# Patient Record
Sex: Male | Born: 1935
Health system: Southern US, Community
[De-identification: ages and names within clinical notes are randomized; demographics above are authoritative.]

## PROBLEM LIST (undated history)

## (undated) DIAGNOSIS — N182 Chronic kidney disease, stage 2 (mild): Secondary | ICD-10-CM

## (undated) DIAGNOSIS — Z720 Tobacco use: Secondary | ICD-10-CM

## (undated) DIAGNOSIS — E785 Hyperlipidemia, unspecified: Secondary | ICD-10-CM

## (undated) DIAGNOSIS — I10 Essential (primary) hypertension: Secondary | ICD-10-CM

## (undated) DIAGNOSIS — R7303 Prediabetes: Secondary | ICD-10-CM

## (undated) DIAGNOSIS — S065XAA Traumatic subdural hemorrhage with loss of consciousness status unknown, initial encounter: Secondary | ICD-10-CM

## (undated) DIAGNOSIS — N200 Calculus of kidney: Secondary | ICD-10-CM

## (undated) DIAGNOSIS — S065X9A Traumatic subdural hemorrhage with loss of consciousness of unspecified duration, initial encounter: Secondary | ICD-10-CM

## (undated) DIAGNOSIS — I255 Ischemic cardiomyopathy: Secondary | ICD-10-CM

## (undated) DIAGNOSIS — D696 Thrombocytopenia, unspecified: Secondary | ICD-10-CM

## (undated) DIAGNOSIS — I2510 Atherosclerotic heart disease of native coronary artery without angina pectoris: Secondary | ICD-10-CM

## (undated) DIAGNOSIS — Z789 Other specified health status: Secondary | ICD-10-CM

## (undated) DIAGNOSIS — I4510 Unspecified right bundle-branch block: Secondary | ICD-10-CM

## (undated) HISTORY — DX: Atherosclerotic heart disease of native coronary artery without angina pectoris: I25.10

## (undated) HISTORY — DX: Hyperlipidemia, unspecified: E78.5

## (undated) HISTORY — DX: Prediabetes: R73.03

## (undated) HISTORY — DX: Thrombocytopenia, unspecified: D69.6

## (undated) HISTORY — DX: Tobacco use: Z72.0

## (undated) HISTORY — DX: Chronic kidney disease, stage 2 (mild): N18.2

## (undated) HISTORY — DX: Ischemic cardiomyopathy: I25.5

## (undated) HISTORY — DX: Calculus of kidney: N20.0

## (undated) HISTORY — DX: Unspecified right bundle-branch block: I45.10

## (undated) HISTORY — DX: Traumatic subdural hemorrhage with loss of consciousness status unknown, initial encounter: S06.5XAA

## (undated) HISTORY — DX: Traumatic subdural hemorrhage with loss of consciousness of unspecified duration, initial encounter: S06.5X9A

## (undated) HISTORY — DX: Other specified health status: Z78.9

---

## 2014-06-13 DIAGNOSIS — E782 Mixed hyperlipidemia: Secondary | ICD-10-CM | POA: Diagnosis not present

## 2014-06-13 DIAGNOSIS — E119 Type 2 diabetes mellitus without complications: Secondary | ICD-10-CM | POA: Diagnosis not present

## 2014-06-15 DIAGNOSIS — E782 Mixed hyperlipidemia: Secondary | ICD-10-CM | POA: Diagnosis not present

## 2014-06-15 DIAGNOSIS — E119 Type 2 diabetes mellitus without complications: Secondary | ICD-10-CM | POA: Diagnosis not present

## 2014-06-15 DIAGNOSIS — I10 Essential (primary) hypertension: Secondary | ICD-10-CM | POA: Diagnosis not present

## 2014-12-18 DIAGNOSIS — E782 Mixed hyperlipidemia: Secondary | ICD-10-CM | POA: Diagnosis not present

## 2014-12-18 DIAGNOSIS — Z125 Encounter for screening for malignant neoplasm of prostate: Secondary | ICD-10-CM | POA: Diagnosis not present

## 2014-12-18 DIAGNOSIS — E119 Type 2 diabetes mellitus without complications: Secondary | ICD-10-CM | POA: Diagnosis not present

## 2014-12-21 DIAGNOSIS — E119 Type 2 diabetes mellitus without complications: Secondary | ICD-10-CM | POA: Diagnosis not present

## 2014-12-21 DIAGNOSIS — Z9181 History of falling: Secondary | ICD-10-CM | POA: Diagnosis not present

## 2014-12-21 DIAGNOSIS — Z139 Encounter for screening, unspecified: Secondary | ICD-10-CM | POA: Diagnosis not present

## 2014-12-21 DIAGNOSIS — I10 Essential (primary) hypertension: Secondary | ICD-10-CM | POA: Diagnosis not present

## 2014-12-21 DIAGNOSIS — E782 Mixed hyperlipidemia: Secondary | ICD-10-CM | POA: Diagnosis not present

## 2014-12-21 DIAGNOSIS — Z5329 Procedure and treatment not carried out because of patient's decision for other reasons: Secondary | ICD-10-CM | POA: Diagnosis not present

## 2015-06-23 DIAGNOSIS — S62631A Displaced fracture of distal phalanx of left index finger, initial encounter for closed fracture: Secondary | ICD-10-CM | POA: Diagnosis not present

## 2015-06-23 DIAGNOSIS — I10 Essential (primary) hypertension: Secondary | ICD-10-CM | POA: Diagnosis not present

## 2015-06-23 DIAGNOSIS — S62632B Displaced fracture of distal phalanx of right middle finger, initial encounter for open fracture: Secondary | ICD-10-CM | POA: Diagnosis not present

## 2015-06-23 DIAGNOSIS — S62601B Fracture of unspecified phalanx of left index finger, initial encounter for open fracture: Secondary | ICD-10-CM | POA: Diagnosis not present

## 2015-06-23 DIAGNOSIS — S61311A Laceration without foreign body of left index finger with damage to nail, initial encounter: Secondary | ICD-10-CM | POA: Diagnosis not present

## 2015-06-23 DIAGNOSIS — S61312A Laceration without foreign body of right middle finger with damage to nail, initial encounter: Secondary | ICD-10-CM | POA: Diagnosis not present

## 2015-06-23 DIAGNOSIS — S62631B Displaced fracture of distal phalanx of left index finger, initial encounter for open fracture: Secondary | ICD-10-CM | POA: Diagnosis not present

## 2015-06-23 DIAGNOSIS — S61212A Laceration without foreign body of right middle finger without damage to nail, initial encounter: Secondary | ICD-10-CM | POA: Diagnosis not present

## 2015-06-23 DIAGNOSIS — S6991XA Unspecified injury of right wrist, hand and finger(s), initial encounter: Secondary | ICD-10-CM | POA: Diagnosis not present

## 2015-06-23 DIAGNOSIS — S62604A Fracture of unspecified phalanx of right ring finger, initial encounter for closed fracture: Secondary | ICD-10-CM | POA: Diagnosis not present

## 2015-06-23 DIAGNOSIS — Z23 Encounter for immunization: Secondary | ICD-10-CM | POA: Diagnosis not present

## 2015-06-23 DIAGNOSIS — Z720 Tobacco use: Secondary | ICD-10-CM | POA: Diagnosis not present

## 2015-06-23 DIAGNOSIS — S62633A Displaced fracture of distal phalanx of left middle finger, initial encounter for closed fracture: Secondary | ICD-10-CM | POA: Diagnosis not present

## 2015-06-23 DIAGNOSIS — W312XXA Contact with powered woodworking and forming machines, initial encounter: Secondary | ICD-10-CM | POA: Diagnosis not present

## 2015-06-23 DIAGNOSIS — S62632A Displaced fracture of distal phalanx of right middle finger, initial encounter for closed fracture: Secondary | ICD-10-CM | POA: Diagnosis not present

## 2015-06-23 DIAGNOSIS — S61214A Laceration without foreign body of right ring finger without damage to nail, initial encounter: Secondary | ICD-10-CM | POA: Diagnosis not present

## 2015-06-25 DIAGNOSIS — S68621D Partial traumatic transphalangeal amputation of left index finger, subsequent encounter: Secondary | ICD-10-CM | POA: Diagnosis not present

## 2015-06-25 DIAGNOSIS — S68624D Partial traumatic transphalangeal amputation of right ring finger, subsequent encounter: Secondary | ICD-10-CM | POA: Diagnosis not present

## 2015-06-25 DIAGNOSIS — S68622D Partial traumatic transphalangeal amputation of right middle finger, subsequent encounter: Secondary | ICD-10-CM | POA: Diagnosis not present

## 2015-07-02 DIAGNOSIS — S68621D Partial traumatic transphalangeal amputation of left index finger, subsequent encounter: Secondary | ICD-10-CM | POA: Diagnosis not present

## 2015-07-02 DIAGNOSIS — S68622D Partial traumatic transphalangeal amputation of right middle finger, subsequent encounter: Secondary | ICD-10-CM | POA: Diagnosis not present

## 2015-07-02 DIAGNOSIS — S68624D Partial traumatic transphalangeal amputation of right ring finger, subsequent encounter: Secondary | ICD-10-CM | POA: Diagnosis not present

## 2015-07-23 DIAGNOSIS — S68621D Partial traumatic transphalangeal amputation of left index finger, subsequent encounter: Secondary | ICD-10-CM | POA: Diagnosis not present

## 2015-07-23 DIAGNOSIS — S68624D Partial traumatic transphalangeal amputation of right ring finger, subsequent encounter: Secondary | ICD-10-CM | POA: Diagnosis not present

## 2015-07-23 DIAGNOSIS — S68622D Partial traumatic transphalangeal amputation of right middle finger, subsequent encounter: Secondary | ICD-10-CM | POA: Diagnosis not present

## 2015-07-30 DIAGNOSIS — E782 Mixed hyperlipidemia: Secondary | ICD-10-CM | POA: Diagnosis not present

## 2015-07-30 DIAGNOSIS — E119 Type 2 diabetes mellitus without complications: Secondary | ICD-10-CM | POA: Diagnosis not present

## 2015-08-02 DIAGNOSIS — Z5329 Procedure and treatment not carried out because of patient's decision for other reasons: Secondary | ICD-10-CM | POA: Diagnosis not present

## 2015-08-02 DIAGNOSIS — I10 Essential (primary) hypertension: Secondary | ICD-10-CM | POA: Diagnosis not present

## 2015-08-02 DIAGNOSIS — E782 Mixed hyperlipidemia: Secondary | ICD-10-CM | POA: Diagnosis not present

## 2015-08-02 DIAGNOSIS — E119 Type 2 diabetes mellitus without complications: Secondary | ICD-10-CM | POA: Diagnosis not present

## 2015-08-02 DIAGNOSIS — Z1389 Encounter for screening for other disorder: Secondary | ICD-10-CM | POA: Diagnosis not present

## 2017-06-28 ENCOUNTER — Ambulatory Visit: Payer: Self-pay | Admitting: Urology

## 2017-08-31 DIAGNOSIS — Z139 Encounter for screening, unspecified: Secondary | ICD-10-CM | POA: Diagnosis not present

## 2017-08-31 DIAGNOSIS — E782 Mixed hyperlipidemia: Secondary | ICD-10-CM | POA: Diagnosis not present

## 2017-08-31 DIAGNOSIS — I10 Essential (primary) hypertension: Secondary | ICD-10-CM | POA: Diagnosis not present

## 2017-08-31 DIAGNOSIS — Z1331 Encounter for screening for depression: Secondary | ICD-10-CM | POA: Diagnosis not present

## 2017-08-31 DIAGNOSIS — Z87442 Personal history of urinary calculi: Secondary | ICD-10-CM | POA: Diagnosis not present

## 2017-08-31 DIAGNOSIS — E119 Type 2 diabetes mellitus without complications: Secondary | ICD-10-CM | POA: Diagnosis not present

## 2017-09-16 DIAGNOSIS — L02511 Cutaneous abscess of right hand: Secondary | ICD-10-CM | POA: Diagnosis not present

## 2017-09-17 DIAGNOSIS — L02511 Cutaneous abscess of right hand: Secondary | ICD-10-CM | POA: Diagnosis not present

## 2017-09-30 DIAGNOSIS — L02511 Cutaneous abscess of right hand: Secondary | ICD-10-CM | POA: Diagnosis not present

## 2017-10-27 DIAGNOSIS — S68624A Partial traumatic transphalangeal amputation of right ring finger, initial encounter: Secondary | ICD-10-CM | POA: Diagnosis not present

## 2017-10-27 DIAGNOSIS — Z1839 Other retained organic fragments: Secondary | ICD-10-CM | POA: Diagnosis not present

## 2017-10-27 DIAGNOSIS — M71341 Other bursal cyst, right hand: Secondary | ICD-10-CM | POA: Diagnosis not present

## 2017-10-27 DIAGNOSIS — Z89021 Acquired absence of right finger(s): Secondary | ICD-10-CM | POA: Diagnosis not present

## 2017-10-27 DIAGNOSIS — M79644 Pain in right finger(s): Secondary | ICD-10-CM | POA: Diagnosis not present

## 2017-10-27 DIAGNOSIS — L602 Onychogryphosis: Secondary | ICD-10-CM | POA: Diagnosis not present

## 2017-10-27 DIAGNOSIS — M7989 Other specified soft tissue disorders: Secondary | ICD-10-CM | POA: Diagnosis not present

## 2017-10-29 DIAGNOSIS — M25841 Other specified joint disorders, right hand: Secondary | ICD-10-CM | POA: Diagnosis not present

## 2017-10-29 DIAGNOSIS — Z89022 Acquired absence of left finger(s): Secondary | ICD-10-CM | POA: Diagnosis not present

## 2017-10-29 DIAGNOSIS — L98498 Non-pressure chronic ulcer of skin of other sites with other specified severity: Secondary | ICD-10-CM | POA: Diagnosis not present

## 2017-10-29 DIAGNOSIS — S68119S Complete traumatic metacarpophalangeal amputation of unspecified finger, sequela: Secondary | ICD-10-CM | POA: Diagnosis not present

## 2017-10-29 DIAGNOSIS — L720 Epidermal cyst: Secondary | ICD-10-CM | POA: Diagnosis not present

## 2018-03-07 DIAGNOSIS — E782 Mixed hyperlipidemia: Secondary | ICD-10-CM | POA: Diagnosis not present

## 2018-03-07 DIAGNOSIS — Z5329 Procedure and treatment not carried out because of patient's decision for other reasons: Secondary | ICD-10-CM | POA: Diagnosis not present

## 2018-03-07 DIAGNOSIS — E119 Type 2 diabetes mellitus without complications: Secondary | ICD-10-CM | POA: Diagnosis not present

## 2018-03-07 DIAGNOSIS — I10 Essential (primary) hypertension: Secondary | ICD-10-CM | POA: Diagnosis not present

## 2018-05-30 ENCOUNTER — Other Ambulatory Visit: Payer: Self-pay

## 2018-05-30 ENCOUNTER — Encounter (HOSPITAL_COMMUNITY): Payer: Self-pay | Admitting: Cardiology

## 2018-05-30 ENCOUNTER — Encounter (HOSPITAL_COMMUNITY)
Admission: EM | Disposition: A | Discharge status: Home / Self Care | Payer: Self-pay | Source: Ambulatory Visit | Attending: Interventional Cardiology

## 2018-05-30 ENCOUNTER — Inpatient Hospital Stay (HOSPITAL_COMMUNITY)
Admission: EM | Admit: 2018-05-30 | Discharge: 2018-06-01 | DRG: 247 | Disposition: A | Discharge status: Home / Self Care | Payer: Medicare Other | Source: Ambulatory Visit | Attending: Interventional Cardiology | Admitting: Interventional Cardiology

## 2018-05-30 DIAGNOSIS — I2129 ST elevation (STEMI) myocardial infarction involving other sites: Principal | ICD-10-CM | POA: Diagnosis present

## 2018-05-30 DIAGNOSIS — Z716 Tobacco abuse counseling: Secondary | ICD-10-CM | POA: Diagnosis not present

## 2018-05-30 DIAGNOSIS — I2119 ST elevation (STEMI) myocardial infarction involving other coronary artery of inferior wall: Secondary | ICD-10-CM

## 2018-05-30 DIAGNOSIS — I10 Essential (primary) hypertension: Secondary | ICD-10-CM | POA: Diagnosis not present

## 2018-05-30 DIAGNOSIS — I2102 ST elevation (STEMI) myocardial infarction involving left anterior descending coronary artery: Secondary | ICD-10-CM

## 2018-05-30 DIAGNOSIS — I2510 Atherosclerotic heart disease of native coronary artery without angina pectoris: Secondary | ICD-10-CM | POA: Diagnosis not present

## 2018-05-30 DIAGNOSIS — Z720 Tobacco use: Secondary | ICD-10-CM | POA: Diagnosis not present

## 2018-05-30 DIAGNOSIS — R7303 Prediabetes: Secondary | ICD-10-CM | POA: Diagnosis present

## 2018-05-30 DIAGNOSIS — Z955 Presence of coronary angioplasty implant and graft: Secondary | ICD-10-CM

## 2018-05-30 DIAGNOSIS — E785 Hyperlipidemia, unspecified: Secondary | ICD-10-CM

## 2018-05-30 DIAGNOSIS — I499 Cardiac arrhythmia, unspecified: Secondary | ICD-10-CM | POA: Diagnosis not present

## 2018-05-30 DIAGNOSIS — R079 Chest pain, unspecified: Secondary | ICD-10-CM | POA: Diagnosis not present

## 2018-05-30 DIAGNOSIS — R231 Pallor: Secondary | ICD-10-CM | POA: Diagnosis not present

## 2018-05-30 DIAGNOSIS — Z87442 Personal history of urinary calculi: Secondary | ICD-10-CM | POA: Diagnosis not present

## 2018-05-30 DIAGNOSIS — Z8249 Family history of ischemic heart disease and other diseases of the circulatory system: Secondary | ICD-10-CM

## 2018-05-30 DIAGNOSIS — I213 ST elevation (STEMI) myocardial infarction of unspecified site: Secondary | ICD-10-CM | POA: Diagnosis not present

## 2018-05-30 HISTORY — PX: LEFT HEART CATH AND CORONARY ANGIOGRAPHY: CATH118249

## 2018-05-30 HISTORY — DX: Essential (primary) hypertension: I10

## 2018-05-30 HISTORY — PX: CORONARY/GRAFT ACUTE MI REVASCULARIZATION: CATH118305

## 2018-05-30 LAB — TSH: TSH: 1.558 u[IU]/mL (ref 0.350–4.500)

## 2018-05-30 LAB — CBC
HEMATOCRIT: 35.8 % — AB (ref 39.0–52.0)
Hemoglobin: 12.7 g/dL — ABNORMAL LOW (ref 13.0–17.0)
MCH: 31.1 pg (ref 26.0–34.0)
MCHC: 35.5 g/dL (ref 30.0–36.0)
MCV: 87.5 fL (ref 80.0–100.0)
Platelets: 120 10*3/uL — ABNORMAL LOW (ref 150–400)
RBC: 4.09 MIL/uL — ABNORMAL LOW (ref 4.22–5.81)
RDW: 11.9 % (ref 11.5–15.5)
WBC: 5.9 10*3/uL (ref 4.0–10.5)
nRBC: 0 % (ref 0.0–0.2)

## 2018-05-30 LAB — COMPREHENSIVE METABOLIC PANEL
ALT: 15 U/L (ref 0–44)
AST: 27 U/L (ref 15–41)
Albumin: 3.5 g/dL (ref 3.5–5.0)
Alkaline Phosphatase: 49 U/L (ref 38–126)
Anion gap: 12 (ref 5–15)
BUN: 19 mg/dL (ref 8–23)
CO2: 19 mmol/L — ABNORMAL LOW (ref 22–32)
Calcium: 8.6 mg/dL — ABNORMAL LOW (ref 8.9–10.3)
Chloride: 102 mmol/L (ref 98–111)
Creatinine, Ser: 1.16 mg/dL (ref 0.61–1.24)
GFR calc Af Amer: >60 mL/min (ref >60)
GFR calc non Af Amer: 58 mL/min — ABNORMAL LOW (ref >60)
Glucose, Bld: 197 mg/dL — ABNORMAL HIGH (ref 70–99)
Potassium: 3.6 mmol/L (ref 3.5–5.1)
Sodium: 133 mmol/L — ABNORMAL LOW (ref 135–145)
Total Bilirubin: 0.3 mg/dL (ref 0.3–1.2)
Total Protein: 5.9 g/dL — ABNORMAL LOW (ref 6.5–8.1)

## 2018-05-30 LAB — PROTIME-INR
INR: 1.4
Prothrombin Time: 16.6 seconds — ABNORMAL HIGH (ref 11.4–15.2)

## 2018-05-30 LAB — HEMOGLOBIN A1C
HEMOGLOBIN A1C: 6.1 % — AB (ref 4.8–5.6)
Mean Plasma Glucose: 128.37 mg/dL

## 2018-05-30 LAB — POCT ACTIVATED CLOTTING TIME
Activated Clotting Time: 285 seconds
Activated Clotting Time: 301 seconds

## 2018-05-30 LAB — POCT I-STAT 7, (LYTES, BLD GAS, ICA,H+H)
Acid-base deficit: 2 mmol/L (ref 0.0–2.0)
BICARBONATE: 23 mmol/L (ref 20.0–28.0)
Calcium, Ion: 1.24 mmol/L (ref 1.15–1.40)
HCT: 39 % (ref 39.0–52.0)
HEMOGLOBIN: 13.3 g/dL (ref 13.0–17.0)
O2 Saturation: 97 %
Potassium: 3.7 mmol/L (ref 3.5–5.1)
Sodium: 138 mmol/L (ref 135–145)
TCO2: 24 mmol/L (ref 22–32)
pCO2 arterial: 37.6 mmHg (ref 32.0–48.0)
pH, Arterial: 7.394 (ref 7.350–7.450)
pO2, Arterial: 92 mmHg (ref 83.0–108.0)

## 2018-05-30 LAB — TROPONIN I
Troponin I: 0.06 ng/mL (ref <0.03)
Troponin I: 8.76 ng/mL (ref <0.03)

## 2018-05-30 LAB — POCT I-STAT CREATININE: Creatinine, Ser: 1 mg/dL (ref 0.61–1.24)

## 2018-05-30 LAB — BRAIN NATRIURETIC PEPTIDE: B NATRIURETIC PEPTIDE 5: 40.6 pg/mL (ref 0.0–100.0)

## 2018-05-30 LAB — LIPID PANEL
Cholesterol: 152 mg/dL (ref 0–200)
HDL: 31 mg/dL — ABNORMAL LOW (ref >40)
LDL Cholesterol: 91 mg/dL (ref 0–99)
Total CHOL/HDL Ratio: 4.9 RATIO
Triglycerides: 151 mg/dL — ABNORMAL HIGH (ref <150)
VLDL: 30 mg/dL (ref 0–40)

## 2018-05-30 LAB — MRSA PCR SCREENING: MRSA by PCR: NEGATIVE

## 2018-05-30 LAB — APTT: aPTT: >200 seconds (ref 24–36)

## 2018-05-30 SURGERY — CORONARY/GRAFT ACUTE MI REVASCULARIZATION
Anesthesia: LOCAL

## 2018-05-30 MED ORDER — VERAPAMIL HCL 2.5 MG/ML IV SOLN
INTRAVENOUS | Status: AC
  Filled 2018-05-30: qty 2

## 2018-05-30 MED ORDER — OXYCODONE HCL 5 MG PO TABS: 5.0000 mg | ORAL_TABLET | ORAL | Status: DC | PRN

## 2018-05-30 MED ORDER — SODIUM CHLORIDE 0.9% FLUSH
3.0000 mL | Freq: Two times a day (BID) | INTRAVENOUS | Status: DC
  Administered 2018-05-30 – 2018-06-01 (×4): 3 mL via INTRAVENOUS

## 2018-05-30 MED ORDER — SODIUM CHLORIDE 0.9% FLUSH: 3.0000 mL | INTRAVENOUS | Status: DC | PRN

## 2018-05-30 MED ORDER — TICAGRELOR 90 MG PO TABS
ORAL_TABLET | ORAL | Status: AC
  Filled 2018-05-30: qty 2

## 2018-05-30 MED ORDER — TICAGRELOR 90 MG PO TABS
90.0000 mg | ORAL_TABLET | Freq: Two times a day (BID) | ORAL | Status: DC
  Administered 2018-05-30 – 2018-06-01 (×4): 90 mg via ORAL
  Filled 2018-05-30 (×4): qty 1

## 2018-05-30 MED ORDER — LISINOPRIL 20 MG PO TABS
40.0000 mg | ORAL_TABLET | Freq: Every day | ORAL | Status: DC
  Administered 2018-05-30 – 2018-06-01 (×3): 40 mg via ORAL
  Filled 2018-05-30 (×3): qty 2
  Filled 2018-05-30: qty 4

## 2018-05-30 MED ORDER — FENTANYL CITRATE (PF) 100 MCG/2ML IJ SOLN
INTRAMUSCULAR | Status: AC
  Filled 2018-05-30: qty 2

## 2018-05-30 MED ORDER — ATORVASTATIN CALCIUM 80 MG PO TABS
80.0000 mg | ORAL_TABLET | Freq: Every day | ORAL | Status: DC
  Administered 2018-05-30 – 2018-05-31 (×2): 80 mg via ORAL
  Filled 2018-05-30 (×2): qty 1

## 2018-05-30 MED ORDER — NITROGLYCERIN 1 MG/10 ML FOR IR/CATH LAB
INTRA_ARTERIAL | Status: DC | PRN
  Administered 2018-05-30 (×2): 200 ug via INTRACORONARY

## 2018-05-30 MED ORDER — IOHEXOL 350 MG/ML SOLN
INTRAVENOUS | Status: DC | PRN
  Administered 2018-05-30: 200 mL via INTRA_ARTERIAL

## 2018-05-30 MED ORDER — FENTANYL CITRATE (PF) 100 MCG/2ML IJ SOLN
INTRAMUSCULAR | Status: DC | PRN
  Administered 2018-05-30: 25 ug via INTRAVENOUS

## 2018-05-30 MED ORDER — ONDANSETRON HCL 4 MG/2ML IJ SOLN: 4.0000 mg | Freq: Four times a day (QID) | INTRAMUSCULAR | Status: DC | PRN

## 2018-05-30 MED ORDER — SODIUM CHLORIDE 0.9 % IV SOLN
INTRAVENOUS | Status: AC
  Administered 2018-05-30: 18:00:00 via INTRAVENOUS

## 2018-05-30 MED ORDER — HEPARIN (PORCINE) IN NACL 1000-0.9 UT/500ML-% IV SOLN
INTRAVENOUS | Status: DC | PRN
  Administered 2018-05-30: 500 mL

## 2018-05-30 MED ORDER — SODIUM CHLORIDE 0.9 % IV SOLN: 250.0000 mL | INTRAVENOUS | Status: DC | PRN

## 2018-05-30 MED ORDER — TICAGRELOR 90 MG PO TABS
ORAL_TABLET | ORAL | Status: DC | PRN
  Administered 2018-05-30: 180 mg via ORAL

## 2018-05-30 MED ORDER — METOPROLOL SUCCINATE ER 25 MG PO TB24
12.5000 mg | ORAL_TABLET | Freq: Every day | ORAL | Status: DC
  Filled 2018-05-30: qty 0.5

## 2018-05-30 MED ORDER — MIDAZOLAM HCL 2 MG/2ML IJ SOLN
INTRAMUSCULAR | Status: DC | PRN
  Administered 2018-05-30: 0.5 mg via INTRAVENOUS

## 2018-05-30 MED ORDER — LIDOCAINE HCL (PF) 1 % IJ SOLN
INTRAMUSCULAR | Status: AC
  Filled 2018-05-30: qty 30

## 2018-05-30 MED ORDER — HEPARIN SODIUM (PORCINE) 1000 UNIT/ML IJ SOLN
INTRAMUSCULAR | Status: AC
  Filled 2018-05-30: qty 1

## 2018-05-30 MED ORDER — NITROGLYCERIN 1 MG/10 ML FOR IR/CATH LAB
INTRA_ARTERIAL | Status: AC
  Filled 2018-05-30: qty 10

## 2018-05-30 MED ORDER — HEPARIN SODIUM (PORCINE) 1000 UNIT/ML IJ SOLN
INTRAMUSCULAR | Status: DC | PRN
  Administered 2018-05-30 (×2): 5000 [IU] via INTRAVENOUS
  Administered 2018-05-30: 2000 [IU] via INTRAVENOUS

## 2018-05-30 MED ORDER — CARVEDILOL 3.125 MG PO TABS
3.1250 mg | ORAL_TABLET | Freq: Two times a day (BID) | ORAL | Status: DC
  Administered 2018-05-30 – 2018-06-01 (×4): 3.125 mg via ORAL
  Filled 2018-05-30 (×4): qty 1

## 2018-05-30 MED ORDER — ACETAMINOPHEN 325 MG PO TABS: 650.0000 mg | ORAL_TABLET | ORAL | Status: DC | PRN

## 2018-05-30 MED ORDER — HEPARIN (PORCINE) IN NACL 1000-0.9 UT/500ML-% IV SOLN
INTRAVENOUS | Status: AC
  Filled 2018-05-30: qty 1000

## 2018-05-30 MED ORDER — VERAPAMIL HCL 2.5 MG/ML IV SOLN
INTRAVENOUS | Status: DC | PRN
  Administered 2018-05-30: 10 mL via INTRA_ARTERIAL

## 2018-05-30 MED ORDER — HEPARIN SODIUM (PORCINE) 5000 UNIT/ML IJ SOLN
5000.0000 [IU] | Freq: Three times a day (TID) | INTRAMUSCULAR | Status: DC
  Administered 2018-05-30 – 2018-06-01 (×5): 5000 [IU] via SUBCUTANEOUS
  Filled 2018-05-30 (×4): qty 1

## 2018-05-30 MED ORDER — LABETALOL HCL 5 MG/ML IV SOLN: 10.0000 mg | INTRAVENOUS | Status: AC | PRN

## 2018-05-30 MED ORDER — LIDOCAINE HCL (PF) 1 % IJ SOLN
INTRAMUSCULAR | Status: DC | PRN
  Administered 2018-05-30: 2 mL

## 2018-05-30 MED ORDER — ASPIRIN 81 MG PO CHEW
81.0000 mg | CHEWABLE_TABLET | Freq: Every day | ORAL | Status: DC
  Administered 2018-05-31 – 2018-06-01 (×2): 81 mg via ORAL
  Filled 2018-05-30 (×2): qty 1

## 2018-05-30 MED ORDER — HYDRALAZINE HCL 20 MG/ML IJ SOLN: 5.0000 mg | INTRAMUSCULAR | Status: AC | PRN

## 2018-05-30 MED ORDER — MIDAZOLAM HCL 2 MG/2ML IJ SOLN
INTRAMUSCULAR | Status: AC
  Filled 2018-05-30: qty 2

## 2018-05-30 SURGICAL SUPPLY — 19 items
BALLN SAPPHIRE 2.0X12 (BALLOONS) ×2
BALLN SAPPHIRE ~~LOC~~ 2.5X12 (BALLOONS) ×1 IMPLANT
BALLOON SAPPHIRE 2.0X12 (BALLOONS) IMPLANT
CATH 5FR JL3.5 JR4 ANG PIG MP (CATHETERS) ×1 IMPLANT
CATH VISTA GUIDE 6FR XBLAD3.5 (CATHETERS) ×1 IMPLANT
DEVICE RAD COMP TR BAND LRG (VASCULAR PRODUCTS) ×1 IMPLANT
ELECT DEFIB PAD ADLT CADENCE (PAD) ×1 IMPLANT
GLIDESHEATH SLEND A-KIT 6F 22G (SHEATH) ×1 IMPLANT
GUIDEWIRE INQWIRE 1.5J.035X260 (WIRE) IMPLANT
INQWIRE 1.5J .035X260CM (WIRE) ×2
KIT ENCORE 26 ADVANTAGE (KITS) ×1 IMPLANT
KIT HEART LEFT (KITS) ×2 IMPLANT
PACK CARDIAC CATHETERIZATION (CUSTOM PROCEDURE TRAY) ×2 IMPLANT
SHEATH PROBE COVER 6X72 (BAG) ×1 IMPLANT
STENT RESOLUTE ONYX 2.5X18 (Permanent Stent) ×1 IMPLANT
TRANSDUCER W/STOPCOCK (MISCELLANEOUS) ×2 IMPLANT
TUBING CIL FLEX 10 FLL-RA (TUBING) ×2 IMPLANT
WIRE ASAHI PROWATER 180CM (WIRE) ×1 IMPLANT
WIRE HI TORQ VERSACORE-J 145CM (WIRE) ×1 IMPLANT

## 2018-05-30 NOTE — Progress Notes (Signed)
Patient refused both vaccines

## 2018-05-30 NOTE — Progress Notes (Signed)
Scratches on both shins- from briars

## 2018-05-30 NOTE — Progress Notes (Signed)
Nurse paged because patient does not want to take metoprolol due to previously feeling malaise on this medicine. SBP 170s, HR 70s, EF 50%, no wheezing or respiratory issues. Will start carvedilol 3.125mg  BID. Henessy Rohrer PA-C

## 2018-05-30 NOTE — Progress Notes (Signed)
Assessment as noted, admitted to room 14 via bed from cath lab. Patient calm, alert, sociable, instructed to not move right arm. Elevated on pillows.  No drainage from site. Drinking fluids and tolerated no complaints nausea/pain

## 2018-05-30 NOTE — Progress Notes (Signed)
Clothes baseball hat

## 2018-05-30 NOTE — H&P (Signed)
Cardiology Admission History and Physical:   Patient ID: Craig [name] MRN: [id number]; DOB: 02/20/1935   Admission date: 05/30/2018  Primary Care Provider: No primary care provider on file. Primary Cardiologist: No primary care provider on file.  Primary Electrophysiologist:  None   Chief Complaint:  Chest pain  Patient Profile:   Craig [name] is a 84 y.o. male with PMH of HTN and kidney stones who developed chest pain this afternoon while working in his yard.   History of Present Illness:   Craig [name] is an 84 yo male with PMH of HTN and kidney stones. He does not visit a PCP on a regular basis. Non-smoker. Reports being in his usual state of health until about an hour prior to admission. States he was out in his yard this afternoon working, picking up sticks to make room for a new building in his back yard. Developed sudden onset of chest pain. EKG on scene with ST changes in the inferior leads. Given 324mg  ASA and 2 SL nitro via EMS. He was pain free on arrival to the cath lab.    Past Medical History:  Diagnosis Date  . Hypertension     History reviewed. No pertinent surgical history.   Medications Prior to Admission: Prior to Admission medications   Not on File     Allergies:   No Known Allergies  Social History:   Social History   Socioeconomic History  . Marital status: Married    Spouse name: Not on file  . Number of children: Not on file  . Years of education: Not on file  . Highest education level: Not on file  Occupational History  . Not on file  Social Needs  . Financial resource strain: Not on file  . Food insecurity:    Worry: Not on file    Inability: Not on file  . Transportation needs:    Medical: Not on file    Non-medical: Not on file  Tobacco Use  . Smoking status: Never Smoker  . Smokeless tobacco: Current User    Types: Chew  Substance and Sexual Activity  . Alcohol use: Not on file  . Drug use: Not on file  . Sexual  activity: Not on file  Lifestyle  . Physical activity:    Days per week: Not on file    Minutes per session: Not on file  . Stress: Not on file  Relationships  . Social connections:    Talks on phone: Not on file    Gets together: Not on file    Attends religious service: Not on file    Active member of club or organization: Not on file    Attends meetings of clubs or organizations: Not on file    Relationship status: Not on file  . Intimate partner violence:    Fear of current or ex partner: Not on file    Emotionally abused: Not on file    Physically abused: Not on file    Forced sexual activity: Not on file  Other Topics Concern  . Not on file  Social History Narrative  . Not on file    Family History:   The patient's family history includes Hyperlipidemia in his father; Hypertension in his father.    ROS:  Please see the history of present illness.  All other ROS reviewed and negative.     Physical Exam/Data:  There were no vitals filed for this visit. No intake or output data in  the 24 hours ending 05/30/18 1547 No flowsheet data found.   There is no height or weight on file to calculate BMI.   PE per MD  General:  Well nourished, well developed, in no acute distress HEENT: normal Neck: no JVD Cardiac:  normal S1, S2; RRR; no murmur  Lungs:  clear to auscultation bilaterally, no wheezing, rhonchi or rales  Abd: soft, nontender, no hepatomegaly  Ext: no edema Musculoskeletal:  No deformities, BUE and BLE strength normal and equal Skin: warm and dry  Neuro:  CNs 2-12 intact Psych:  Normal affect    EKG:  The ECG that was done 05/30/2018 was personally reviewed and demonstrates SR with acute ST changes in the inferior leads  Relevant CV Studies: N/a  Laboratory Data:  ChemistryNo results for input(s): NA, K, CL, CO2, GLUCOSE, BUN, CREATININE, CALCIUM, GFRNONAA, GFRAA, ANIONGAP in the last 168 hours.  No results for input(s): PROT, ALBUMIN, AST, ALT,  ALKPHOS, BILITOT in the last 168 hours. HematologyNo results for input(s): WBC, RBC, HGB, HCT, MCV, MCH, MCHC, RDW, PLT in the last 168 hours. Cardiac EnzymesNo results for input(s): TROPONINI in the last 168 hours. No results for input(s): TROPIPOC in the last 168 hours.  BNPNo results for input(s): BNP, PROBNP in the last 168 hours.  DDimer No results for input(s): DDIMER in the last 168 hours.  Radiology/Studies:  No results found.  Assessment and Plan:   Craig [name] is a 84 y.o. male with PMH of HTN and kidney stones who developed chest pain this afternoon while working in his yard.   1. STEMI: sudden onset of chest pain while working in his yard about an hour prior to presenting to the hospital. EKG concerning for inferior STEMI. Given 324mg  ASA prior to arrival, with 2 SL nitro. No pain on arrival. Taken directly to the cath lab for emergent cardiac catheterization. Further recommendations post cath.  2. HTN: reports being on lisinopril prior to admission.  Severity of Illness: The appropriate patient status for this patient is INPATIENT. Inpatient status is judged to be reasonable and necessary in order to provide the required intensity of service to ensure the patient's safety. The patient's presenting symptoms, physical exam findings, and initial radiographic and laboratory data in the context of their chronic comorbidities is felt to place them at high risk for further clinical deterioration. Furthermore, it is not anticipated that the patient will be medically stable for discharge from the hospital within 2 midnights of admission. The following factors support the patient status of inpatient.   " The patient's presenting symptoms include chest pain. " The worrisome physical exam findings include normal exam. " The initial radiographic and laboratory data are worrisome because of EKG concerning for STEMI. " The chronic co-morbidities include HTN.   * I certify that at the  point of admission it is my clinical judgment that the patient will require inpatient hospital care spanning beyond 2 midnights from the point of admission due to high intensity of service, high risk for further deterioration and high frequency of surveillance required.*    For questions or updates, please contact [facility] Please consult [email or web address] for contact info under   Signed, Laverda Page, NP  05/30/2018 3:47 PM

## 2018-05-30 NOTE — CV Procedure (Signed)
   Inferolateral STEMI with achievement of right radial access using vascular ultrasound.  80% mid RCA.  RCA difficult to cannulate due to tortuosity in the subclavian and innominate preventing catheter torque.  Left main is widely patent .  The LAD widely patent.  A large first diagonal that runs in the distribution of the ramus intermedius is totally occluded in the proximal third.  Second diagonal has diffuse disease  Circumflex is free of significant obstructive disease.  Successful PCI with stent implantation in the mid first diagonal reducing 100% stenosis to 0% with TIMI grade III flow.  The vessel is large and supplies the entire lateral and inferoapical wall.  Anterolateral hypokinesis.  EF 50%.  No immediate complications.  Recommend staged PCI of the mid to distal right coronary via right femoral approach this admission or electively within the next month.

## 2018-05-31 ENCOUNTER — Encounter (HOSPITAL_COMMUNITY): Payer: Self-pay | Admitting: Interventional Cardiology

## 2018-05-31 ENCOUNTER — Inpatient Hospital Stay (HOSPITAL_COMMUNITY): Payer: Medicare Other

## 2018-05-31 DIAGNOSIS — I2119 ST elevation (STEMI) myocardial infarction involving other coronary artery of inferior wall: Secondary | ICD-10-CM

## 2018-05-31 DIAGNOSIS — Z955 Presence of coronary angioplasty implant and graft: Secondary | ICD-10-CM

## 2018-05-31 DIAGNOSIS — R7303 Prediabetes: Secondary | ICD-10-CM

## 2018-05-31 LAB — CBC
HCT: 39.5 % (ref 39.0–52.0)
HEMOGLOBIN: 13.5 g/dL (ref 13.0–17.0)
MCH: 29.7 pg (ref 26.0–34.0)
MCHC: 34.2 g/dL (ref 30.0–36.0)
MCV: 87 fL (ref 80.0–100.0)
Platelets: 129 10*3/uL — ABNORMAL LOW (ref 150–400)
RBC: 4.54 MIL/uL (ref 4.22–5.81)
RDW: 11.9 % (ref 11.5–15.5)
WBC: 7.4 10*3/uL (ref 4.0–10.5)
nRBC: 0 % (ref 0.0–0.2)

## 2018-05-31 LAB — COMPREHENSIVE METABOLIC PANEL
ALT: 21 U/L (ref 0–44)
ANION GAP: 10 (ref 5–15)
AST: 58 U/L — ABNORMAL HIGH (ref 15–41)
Albumin: 3.5 g/dL (ref 3.5–5.0)
Alkaline Phosphatase: 51 U/L (ref 38–126)
BILIRUBIN TOTAL: 1 mg/dL (ref 0.3–1.2)
BUN: 18 mg/dL (ref 8–23)
CO2: 23 mmol/L (ref 22–32)
Calcium: 8.8 mg/dL — ABNORMAL LOW (ref 8.9–10.3)
Chloride: 104 mmol/L (ref 98–111)
Creatinine, Ser: 1.29 mg/dL — ABNORMAL HIGH (ref 0.61–1.24)
GFR calc Af Amer: 59 mL/min — ABNORMAL LOW (ref >60)
GFR calc non Af Amer: 51 mL/min — ABNORMAL LOW (ref >60)
Glucose, Bld: 136 mg/dL — ABNORMAL HIGH (ref 70–99)
Potassium: 3.4 mmol/L — ABNORMAL LOW (ref 3.5–5.1)
Sodium: 137 mmol/L (ref 135–145)
TOTAL PROTEIN: 5.9 g/dL — AB (ref 6.5–8.1)

## 2018-05-31 LAB — ECHOCARDIOGRAM COMPLETE
Height: 70 in
Weight: 2970.04 oz

## 2018-05-31 NOTE — Progress Notes (Addendum)
Progress Note  Patient Name: Craig [name] Date of Encounter: 05/31/2018  Primary Cardiologist: No primary care provider on file.   Subjective   Patient feeling well this AM. Tells me that he has been experiencing exertional dyspnea for the past 12 month. Often has to sit down and rest when working outside. 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 No CP or SHOB this AM.   Inpatient Medications    Scheduled Meds: . aspirin  81 mg Oral Daily  . atorvastatin  80 mg Oral q1800  . carvedilol  3.125 mg Oral BID WC  . heparin  5,000 Units Subcutaneous Q8H  . lisinopril  40 mg Oral Daily  . sodium chloride flush  3 mL Intravenous Q12H  . ticagrelor  90 mg Oral BID   Continuous Infusions: . sodium chloride     PRN Meds: sodium chloride, acetaminophen, ondansetron (ZOFRAN) IV, oxyCODONE, sodium chloride flush   Vital Signs    Vitals:   05/31/18 0600 05/31/18 0700 05/31/18 0730 05/31/18 0800  BP: 135/68 (!) 142/74    Pulse: 62 80    Resp: 18 (!) 21    Temp:   98.4 F (36.9 C)   TempSrc:   Oral   SpO2: 97% 99%    Weight:    84.2 kg  Height:    5' 10"  (1.778 m)    Intake/Output Summary (Last 24 hours) at 05/31/2018 0850 Last data filed at 05/31/2018 0730 Gross per 24 hour  Intake 738.19 ml  Output 400 ml  Net 338.19 ml   Filed Weights   05/31/18 0800  Weight: 84.2 kg   Telemetry    NSR with occasional bradycardia - Personally Reviewed  ECG    NSR with RBBB - Personally Reviewed  Physical Exam   GEN: No acute distress.   Neck: No JVD Cardiac: RRR, no murmurs, rubs, or gallops.  Respiratory: Clear to auscultation bilaterally. GI: Soft, nontender, non-distended  MS: No edema; No deformity. Neuro:  Nonfocal  Psych: Normal affect   Labs    Chemistry Recent Labs  Lab 05/30/18 1532 05/30/18 1533 05/30/18 1551 05/31/18 0303  NA  --  138 133* 137  K  --  3.7 3.6 3.4*  CL  --   --  102 104  CO2  --   --  19* 23    GLUCOSE  --   --  197* 136*  BUN  --   --  19 18  CREATININE 1.00  --  1.16 1.29*  CALCIUM  --   --  8.6* 8.8*  PROT  --   --  5.9* 5.9*  ALBUMIN  --   --  3.5 3.5  AST  --   --  27 58*  ALT  --   --  15 21  ALKPHOS  --   --  49 51  BILITOT  --   --  0.3 1.0  GFRNONAA  --   --  58* 51*  GFRAA  --   --  >60 59*  ANIONGAP  --   --  12 10    Hematology Recent Labs  Lab 05/30/18 1533 05/30/18 1551 05/31/18 0303  WBC  --  5.9 7.4  RBC  --  4.09* 4.54  HGB 13.3 12.7* 13.5  HCT 39.0 35.8* 39.5  MCV  --  87.5 87.0  MCH  --  31.1 29.7  MCHC  --  35.5 34.2  RDW  --  11.9 11.9  PLT  --  120* 129*   Cardiac Enzymes Recent Labs  Lab 05/30/18 1551 05/30/18 2229  TROPONINI 0.06* 8.76*   No results for input(s): TROPIPOC in the last 168 hours.   BNP Recent Labs  Lab 05/30/18 1933  BNP 40.6    DDimer No results for input(s): DDIMER in the last 168 hours.   Radiology    No results found.  Cardiac Studies   Left Heart Cath 05/30/2018  Acute lateral wall infarction due to thrombotic occlusion of a large first diagonal in its mid segment.  100% stenosis and TIMI grade 0 flow was converted to 0% stenosis with TIMI grade III flow following angioplasty and stenting using a Onyx 2.5 x 18 mm DES deployed at 14 atm.  Widely patent left main  LAD contains luminal irregularities up to 40% throughout the mid segment.  A small second and third diagonal contain up to 80% stenosis.  These diagonals are small.  Circumflex is patent with up to 50% narrowing noted in the mid vessel proximal and distal to the second obtuse marginal.  The right coronary was not selectively engaged but contains a mid vessel eccentric 80% stenosis.  The distal territory was poorly visualized.  Left ventriculography demonstrated anterolateral severe hypokinesis.  Estimated ejection fraction 50%.  LVEDP 19 mmHg.  Recommendations:   Risk factor modification for secondary prevention: High intensity  statin, check hemoglobin A1c, blood pressure control to 130/80 mmHg, cessation of tobacco use, and evaluate for possible sleep disturbance.  Consider staged PCI on the mid right coronary from right femoral approach.  This could be done during this hospital stay or electively in several weeks.  Anticipate discharge in 48 hours.  Patient Profile     84 y.o. male with HTN who presented to the ED with acute onset substernal chest pressure. EKG in EMS concerning for inferior STEMI. Patient subsequently taken for emergent revascularization.   Assessment & Plan    STEMI  CAD s/p DES to the first diagonal  - No CP or SHOB this AM  - Cath with residual RCA stenosis, 80%  - Started on Carvedilol  - Continue ASA and Ticagrelor  - Continue Atorvastatin  - Continue Lisinopril  - Follow-up echocardiogram today  - Cardiac rehab consulted   Pre-diabetes  - Could consider starting metformin   HTN  - Continue lisinopril and carvedilol   Will discuss the case further with Dr. Irish Lack.   For questions or updates, please contact [place] Please consult [email or web address] for contact info under Cardiology/STEMI.     Signed, Ina Homes, MD  05/31/2018, 8:50 AM    I have examined the patient and reviewed assessment and plan and discussed with patient.  Agree with above as stated.    No angina at this time.  Feels well.  Move to tele.  COntinue aggressive secondary prevention.  Possible d/c tomorrow with plan for outpatient PCI of RCA at a later time.   Walk with rehab.  He lives with his wife who has had temporal arteritis.  She is not in the best of health according to the patient.   A1C 6.1.  Management of prediabetes needed.  Larae Grooms

## 2018-05-31 NOTE — Plan of Care (Signed)
Pt currently in bed. Vital signs stable. No complaints at this time and during shift. Pt ambulated with cardiac rehab, 358ft, see note. Family was at bedside this shift. Radial site level 0. No change at this time. Will continue to monitor.  Problem: Education: Goal: Knowledge of General Education information will improve Description Including pain rating scale, medication(s)/side effects and non-pharmacologic comfort measures Outcome: Progressing   Problem: Health Behavior/Discharge Planning: Goal: Ability to manage health-related needs will improve Outcome: Progressing   Problem: Clinical Measurements: Goal: Ability to maintain clinical measurements within normal limits will improve Outcome: Progressing Goal: Will remain free from infection Outcome: Progressing Goal: Diagnostic test results will improve Outcome: Progressing Goal: Respiratory complications will improve Outcome: Progressing Goal: Cardiovascular complication will be avoided Outcome: Progressing   Problem: Activity: Goal: Risk for activity intolerance will decrease Outcome: Progressing   Problem: Nutrition: Goal: Adequate nutrition will be maintained Outcome: Progressing   Problem: Coping: Goal: Level of anxiety will decrease Outcome: Progressing   Problem: Elimination: Goal: Will not experience complications related to bowel motility Outcome: Progressing Goal: Will not experience complications related to urinary retention Outcome: Progressing   Problem: Pain Managment: Goal: General experience of comfort will improve Outcome: Progressing   Problem: Safety: Goal: Ability to remain free from injury will improve Outcome: Progressing   Problem: Skin Integrity: Goal: Risk for impaired skin integrity will decrease Outcome: Progressing

## 2018-05-31 NOTE — Progress Notes (Signed)
CARDIAC REHAB PHASE I   PRE:  Rate/Rhythm: 59 SB  BP:  Supine: 139/81  Sitting:   Standing:    SaO2: 98%RA  MODE:  Ambulation: 370 ft   POST:  Rate/Rhythm: 74 SR  BP:  Supine:   Sitting: 149/76  Standing:    SaO2: 96%RA 1040-1140 Pt walked 370 ft on RA with hand held asst and steady gait. No CP. Tolerated well. Discussed with pt the importance of MI restrictions as family stated he will overdo. Reviewed NTG use, tobacco cessation, importance of brilinta with stent, and heart healthy and low carb food choices. Discussed CRP 2 and referred to [facility] with note that pt for staged PCI.  Discussed not overdoing as pt needs staged PCI. Pt doubtful he will stop chewing tobacco.  Discussed with pt that cardiologist will let him know when he can return to yard work like chopping wood and heavy lifting.   Luetta Nutting, RN BSN  05/31/2018 11:36 AM

## 2018-05-31 NOTE — Plan of Care (Signed)
  Problem: Clinical Measurements: Goal: Ability to maintain clinical measurements within normal limits will improve Outcome: Progressing   Problem: Clinical Measurements: Goal: Respiratory complications will improve Outcome: Progressing   Problem: Activity: Goal: Risk for activity intolerance will decrease Outcome: Progressing   Problem: Nutrition: Goal: Adequate nutrition will be maintained Outcome: Progressing   Problem: Coping: Goal: Level of anxiety will decrease Outcome: Progressing   Problem: Elimination: Goal: Will not experience complications related to urinary retention Outcome: Progressing   Problem: Pain Managment: Goal: General experience of comfort will improve Outcome: Progressing   Problem: Safety: Goal: Ability to remain free from injury will improve Outcome: Progressing   Problem: Skin Integrity: Goal: Risk for impaired skin integrity will decrease Outcome: Progressing

## 2018-05-31 NOTE — Progress Notes (Signed)
  Echocardiogram 2D Echocardiogram has been performed.  Celene Skeen 05/31/2018, 2:42 PM

## 2018-05-31 NOTE — Care Management (Signed)
#   4.   S/W CONSUELA  @ [facility] # [id number]  TICAGRELOR: NON-FORMULARY  1. BRILINTA  90 MG BID COVER- NOT COVER  NOT PART - D ELIGIBLE PRIOR APPROVAL- YES # [phone]   ALTERNATIVE :  1. CLOPIDOGREL  75 MG BID COVER- YES CO-PAY  $ 8.00 TIER- 2 DRUG PRIOR APPROVAL- NO 90 DAY SUPPLY  FOR M/O ZERO DOLLARA 90 DAY SUPPLY FOR RETAIL $ 24.00  EFFIENT : NOT COVER  NOT PART - D ELIGIBLE  2. PRASUGREL  10 MG BID COVER- YES CO-PAY- $ 47.00 TIER- 3 DRUG PRIOR APPROVAL- NO  PREFERRED PHARMACY : YES [facility] AND [facility] M[facility]

## 2018-05-31 NOTE — Progress Notes (Signed)
CRITICAL VALUE ALERT  Critical Value:  Troponin 8.76  Date & Time Notied:  05/30/18 @2200   Provider Notified: Akter (Cards Fellow)  Orders Received/Actions taken: No new orders given at this time.

## 2018-05-31 NOTE — Care Management (Addendum)
CM will send for Brilinta benefits check once insurance information is obtained.  05/31/18 @ 1245-Olanda Boughner-Benefits check sent and pending for Brilinta.  Colleen Can RN, BSN, NCM-BC, ACM-RN [phone]

## 2018-06-01 ENCOUNTER — Telehealth: Payer: Self-pay | Admitting: Interventional Cardiology

## 2018-06-01 DIAGNOSIS — Z720 Tobacco use: Secondary | ICD-10-CM

## 2018-06-01 LAB — BASIC METABOLIC PANEL
Anion gap: 10 (ref 5–15)
BUN: 17 mg/dL (ref 8–23)
CO2: 24 mmol/L (ref 22–32)
CREATININE: 1.21 mg/dL (ref 0.61–1.24)
Calcium: 9.5 mg/dL (ref 8.9–10.3)
Chloride: 102 mmol/L (ref 98–111)
GFR calc Af Amer: >60 mL/min (ref >60)
GFR calc non Af Amer: 55 mL/min — ABNORMAL LOW (ref >60)
Glucose, Bld: 130 mg/dL — ABNORMAL HIGH (ref 70–99)
Potassium: 3.9 mmol/L (ref 3.5–5.1)
Sodium: 136 mmol/L (ref 135–145)

## 2018-06-01 MED ORDER — ASPIRIN 81 MG PO CHEW: 81.0000 mg | CHEWABLE_TABLET | Freq: Every day | ORAL | Status: DC

## 2018-06-01 MED ORDER — TICAGRELOR 90 MG PO TABS: 90.0000 mg | ORAL_TABLET | Freq: Two times a day (BID) | ORAL | 2 refills | Status: DC

## 2018-06-01 MED ORDER — ATORVASTATIN CALCIUM 80 MG PO TABS: 80.0000 mg | ORAL_TABLET | Freq: Every day | ORAL | 3 refills | Status: DC

## 2018-06-01 MED ORDER — CARVEDILOL 3.125 MG PO TABS: 3.1250 mg | ORAL_TABLET | Freq: Two times a day (BID) | ORAL | 3 refills | Status: DC

## 2018-06-01 MED ORDER — NITROGLYCERIN 0.4 MG SL SUBL: 0.4000 mg | SUBLINGUAL_TABLET | SUBLINGUAL | 2 refills | Status: DC | PRN

## 2018-06-01 MED FILL — ATORVASTATIN CALCIUM 80 MG: 80 | 30 days supply | Qty: 30 | Fill #0 | Status: TO

## 2018-06-01 MED FILL — CARVEDILOL 3.125 MG TABLET: 3.125 | 30 days supply | Qty: 60 | Fill #0 | Status: TO

## 2018-06-01 MED FILL — NITROGLYCERIN 0.4 MG TAB SL: 0.4 | 7 days supply | Qty: 25 | Fill #0 | Status: TO

## 2018-06-01 MED FILL — BRILINTA 90 MG TABLET: 90 | 30 days supply | Qty: 60 | Fill #0 | Status: TO

## 2018-06-01 NOTE — Telephone Encounter (Signed)
  TOC appt per Laverda Page on 06/13/18 @ 9:00 am with Ronie Spies

## 2018-06-01 NOTE — Discharge Summary (Addendum)
Discharge Summary    Patient ID: Craig [name],  MRN: [id number], DOB/AGE: 1935/02/10 84 y.o.  Admit date: 05/30/2018 Discharge date: 06/01/2018  Primary Care Provider: Practice, [facility] Primary Cardiologist: Dr. Tamala Julian  Discharge Diagnoses    Principal Problem:   Acute ST elevation myocardial infarction (STEMI) of inferolateral wall [facility]) Active Problems:   Essential hypertension   Hyperlipidemia LDL goal <70   Acute ST elevation myocardial infarction (STEMI) of lateral wall (HCC)   Status post coronary artery stent placement   Tobacco use   Allergies No Known Allergies  Diagnostic Studies/Procedures    Cath: 05/30/2018   Acute lateral wall infarction due to thrombotic occlusion of a large first diagonal in its mid segment.  100% stenosis and TIMI grade 0 flow was converted to 0% stenosis with TIMI grade III flow following angioplasty and stenting using a Onyx 2.5 x 18 mm DES deployed at 14 atm.  Widely patent left main  LAD contains luminal irregularities up to 40% throughout the mid segment.  A small second and third diagonal contain up to 80% stenosis.  These diagonals are small.  Circumflex is patent with up to 50% narrowing noted in the mid vessel proximal and distal to the second obtuse marginal.  The right coronary was not selectively engaged but contains a mid vessel eccentric 80% stenosis.  The distal territory was poorly visualized.  Left ventriculography demonstrated anterolateral severe hypokinesis.  Estimated ejection fraction 50%.  LVEDP 19 mmHg.  Recommendations:   Risk factor modification for secondary prevention: High intensity statin, check hemoglobin A1c, blood pressure control to 130/80 mmHg, cessation of tobacco use, and evaluate for possible sleep disturbance.  Consider staged PCI on the mid right coronary from right femoral approach.  This could be done during this hospital stay or electively in several  weeks.  Anticipate discharge in 48 hours.  TTE: 05/31/2018  IMPRESSIONS    1. The left ventricle has mildly reduced systolic function, with an ejection fraction of 45-50%. The cavity size was normal. Left ventricular diastolic Doppler parameters are consistent with pseudonormalization. Severe hypokinesis of the anterolateral  wall and the mid inferolateral wall. Hypokinesis of the mid anterior wall.  2. The right ventricle has normal systolic function. The cavity was normal. There is no increase in right ventricular wall thickness.  3. The mitral valve is normal in structure. There is mild mitral annular calcification present. No evidence of mitral valve stenosis. Trivial regurgitation.  4. The tricuspid valve is normal in structure.  5. The aortic valve is tricuspid Mild calcification of the aortic valve. no stenosis of the aortic valve.  6. The pulmonic valve was normal in structure.  7. The aortic root and ascending aorta are normal in size and structure.  8. The inferior vena cava was dilated in size with <50% respiratory variability.  9. No complete TR doppler jet so unable to estimate PA systolic pressure. N  FINDINGS  Left Ventricle: The left ventricle has mildly reduced systolic function, with an ejection fraction of 45-50%. The cavity size was normal. There is no increase in left ventricular wall thickness. Left ventricular diastolic Doppler parameters are  consistent with pseudonormalization Right Ventricle: The right ventricle has normal systolic function. The cavity was normal. There is no increase in right ventricular wall thickness. Left Atrium: left atrial size was normal in size Right Atrium: right atrial size was normal in size. Right atrial pressure is estimated at 15 mmHg. Interatrial Septum: No atrial level  shunt detected by color flow Doppler. Pericardium: There is no evidence of pericardial effusion. Mitral Valve: The mitral valve is normal in structure. There is  mild mitral annular calcification present. Mitral valve regurgitation is trivial by color flow Doppler. No evidence of mitral valve stenosis. Tricuspid Valve: The tricuspid valve is normal in structure. Tricuspid valve regurgitation was not visualized by color flow Doppler. Aortic Valve: The aortic valve is tricuspid Mild calcification of the aortic valve. Aortic valve regurgitation was not visualized by color flow Doppler. There is no stenosis of the aortic valve. Pulmonic Valve: The pulmonic valve was normal in structure. Pulmonic valve regurgitation is trivial by color flow Doppler. Aorta: The aortic root and ascending aorta are normal in size and structure. Venous: The inferior vena cava is dilated in size with less than 50% respiratory variability. _____________   History of Present Illness     84 yo male with PMH of HTN and kidney stones. He does not visit a PCP on a regular basis. Non-smoker. Reported being in his usual state of health until about an hour prior to admission. Stated he was out in his yard that afternoon working, picking up sticks to make room for a new building in his back yard. Developed sudden onset of chest pain. EKG on scene with ST changes in the inferior leads. Given 357m ASA and 2 SL nitro via EMS. He was pain free on arrival to the cath lab.   Hospital Course     STEMI: s/p DES to the first diagonal,  with residual RCA stenosis, 80%. Planned for arrangement for PCI as outpatient. Plan for DAPT with ASA/Brilinta for at least one year - Continue Carvedilol 3.125 mg BID, Atorvastatin 80 mg QD, Lisinopril 40 mg QD - Echo with LVEF of 45-50% and suggestive of some increased central venous pressure. No indication for life vest at this time  Pre-diabetes  - Could consider starting metformin as an outpatient.  - Hgb A1c 6.1  HTN  - Continue lisinopril and carvedilol  HL - LDL 91 - on high dose statin  Tobacco Use - cessation advised  Craig THAYERwas  seen by Dr. VIrish Lackand determined stable for discharge home. Follow up in the office has been arranged. Medications are listed below.  _____________  Discharge Vitals Blood pressure (!) 130/57, pulse (!) 58, temperature 98.4 F (36.9 C), temperature source Oral, resp. rate 18, height _0  (1.778 m), weight 84.2 kg, SpO2 98 %.  Filed Weights   05/31/18 0800  Weight: 84.2 kg    Labs & Radiologic Studies    CBC Recent Labs    05/30/18 1551 05/31/18 0303  WBC 5.9 7.4  HGB 12.7* 13.5  HCT 35.8* 39.5  MCV 87.5 87.0  PLT 120* 1735   Basic Metabolic Panel Recent Labs    05/31/18 0303 06/01/18 1056  NA 137 136  K 3.4* 3.9  CL 104 102  CO2 23 24  GLUCOSE 136* 130*  BUN 18 17  CREATININE 1.29* 1.21  CALCIUM 8.8* 9.5   Liver Function Tests Recent Labs    05/30/18 1551 05/31/18 0303  AST 27 58*  ALT 15 21  ALKPHOS 49 51  BILITOT 0.3 1.0  PROT 5.9* 5.9*  ALBUMIN 3.5 3.5   No results for input(s): LIPASE, AMYLASE in the last 72 hours. Cardiac Enzymes Recent Labs    05/30/18 1551 05/30/18 2229  TROPONINI 0.06* 8.76*   BNP Invalid input(s): POCBNP D-Dimer No results for input(s): DDIMER in  the last 72 hours. Hemoglobin A1C Recent Labs    05/30/18 1550  HGBA1C 6.1*   Fasting Lipid Panel Recent Labs    05/30/18 1551  CHOL 152  HDL 31*  LDLCALC 91  TRIG 151*  CHOLHDL 4.9   Thyroid Function Tests Recent Labs    05/30/18 2229  TSH 1.558   _____________  No results found. Disposition   Pt is being discharged home today in good condition.  Follow-up Plans & Appointments    Follow-up Information    Charlie Pitter, PA-C Follow up on 07/14/2018.   Specialties:  Cardiology, Radiology Why:  at [place] for your follow up appt.  Contact information: [address] [place] [address] [phone]          Discharge Instructions    Amb Referral to Cardiac Rehabilitation   Complete by:  As directed    For staged PCI    Diagnosis:   STEMI Coronary Stents     Call MD for:  redness, tenderness, or signs of infection (pain, swelling, redness, odor or green/yellow discharge around incision site)   Complete by:  As directed    Diet - low sodium heart healthy   Complete by:  As directed    Discharge instructions   Complete by:  As directed    Radial Site Care Refer to this sheet in the next few weeks. These instructions provide you with information on caring for yourself after your procedure. Your caregiver may also give you more specific instructions. Your treatment has been planned according to current medical practices, but problems sometimes occur. Call your caregiver if you have any problems or questions after your procedure. HOME CARE INSTRUCTIONS You may shower the day after the procedure.Remove the bandage (dressing) and gently wash the site with plain soap and water.Gently pat the site dry.  Do not apply powder or lotion to the site.  Do not submerge the affected site in water for 3 to 5 days.  Inspect the site at least twice daily.  Do not flex or bend the affected arm for 24 hours.  No lifting over 5 pounds (2.3 kg) for 5 days after your procedure.  Do not drive home if you are discharged the same day of the procedure. Have someone else drive you.  You may drive 24 hours after the procedure unless otherwise instructed by your caregiver.  What to expect: Any bruising will usually fade within 1 to 2 weeks.  Blood that collects in the tissue (hematoma) may be painful to the touch. It should usually decrease in size and tenderness within 1 to 2 weeks.  SEEK IMMEDIATE MEDICAL CARE IF: You have unusual pain at the radial site.  You have redness, warmth, swelling, or pain at the radial site.  You have drainage (other than a small amount of blood on the dressing).  You have chills.  You have a fever or persistent symptoms for more than 72 hours.  You have a fever and your symptoms suddenly get worse.   Your arm becomes pale, cool, tingly, or numb.  You have heavy bleeding from the site. Hold pressure on the site.   PLEASE DO NOT MISS ANY DOSES OF YOUR BRILINTA!!!!! Also keep a log of you blood pressures and bring back to your follow up appt. Please call the office with any questions.   Patients taking blood thinners should generally stay away from medicines like ibuprofen, Advil, Motrin, naproxen, and Aleve due to risk of  stomach bleeding. You may take Tylenol as directed or talk to your primary doctor about alternatives.   Increase activity slowly   Complete by:  As directed        Discharge Medications     Medication List    TAKE these medications   aspirin 81 MG chewable tablet Chew 1 tablet (81 mg total) by mouth daily. Start taking on:  June 02, 2018   atorvastatin 80 MG tablet Commonly known as:  LIPITOR Take 1 tablet (80 mg total) by mouth daily at 6 PM.   carvedilol 3.125 MG tablet Commonly known as:  COREG Take 1 tablet (3.125 mg total) by mouth 2 (two) times daily with a meal.   lisinopril 40 MG tablet Commonly known as:  PRINIVIL,ZESTRIL Take 40 mg by mouth daily with supper.   nitroGLYCERIN 0.4 MG SL tablet Commonly known as:  NITROSTAT Place 1 tablet (0.4 mg total) under the tongue every 5 (five) minutes as needed.   ticagrelor 90 MG Tabs tablet Commonly known as:  BRILINTA Take 1 tablet (90 mg total) by mouth 2 (two) times daily.        Acute coronary syndrome (MI, NSTEMI, STEMI, etc) this admission?: Yes.     AHA/ACC Clinical Performance & Quality Measures: 1. Aspirin prescribed? - Yes 2. ADP Receptor Inhibitor (Plavix/Clopidogrel, Brilinta/Ticagrelor or Effient/Prasugrel) prescribed (includes medically managed patients)? - Yes 3. Beta Blocker prescribed? - Yes 4. High Intensity Statin (Lipitor 40-45m or Crestor 20-436m prescribed? - Yes 5. EF assessed during THIS hospitalization? - Yes 6. For EF <40%, was ACEI/ARB prescribed? -  Yes 7. For EF <40%, Aldosterone Antagonist (Spironolactone or Eplerenone) prescribed? - Not Applicable (EF >/= 4051%8. Cardiac Rehab Phase II ordered (Included Medically managed Patients)? - Yes   Outstanding Labs/Studies   FLP/LFTs in 6 weeks  Duration of Discharge Encounter   Greater than 30 minutes including physician time.  Signed, LiReino BellisP-C 06/01/2018, 12:31 PM  I have examined the patient and reviewed assessment and plan and discussed with patient.  Agree with above as stated.  Right radial site stable.  No angina.  COntinue DAPT with secondary prevention.  Plan for PCI of the RCA in a few weeks with Dr. SmTamala Julian Meds for LV dysfunction started.  COnsider metformin in the future given prediabetes.   JaLarae Grooms

## 2018-06-01 NOTE — Discharge Instructions (Signed)
Aspirin and Your Heart ° °Aspirin is a medicine that prevents the cells in the blood that are used for clotting, called platelets, from sticking together. Aspirin can be used to help reduce the risk of blood clots, heart attacks, and other heart-related problems. °Can I take aspirin? °Your health care provider will help you determine whether it is safe and beneficial for you to take aspirin daily. Taking aspirin daily may be helpful if you: °· Have had a heart attack or chest pain. °· Are at risk for a heart attack. °· Have undergone open-heart surgery, such as coronary artery bypass surgery (CABG). °· Have had coronary angioplasty or a stent. °· Have had certain types of stroke or transient ischemic attack (TIA). °· Have peripheral artery disease (PAD). °· Have chronic heart rhythm problems such as atrial fibrillation and cannot take an anticoagulant. °· Have valve disease or have had surgery on a valve. °What are the risks? °Daily use of aspirin can cause side effects. Some of these include: °· Bleeding. Bleeding problems can be minor or serious. An example of a minor problem is a cut that does not stop bleeding. An example of a more serious problem is stomach bleeding or, rarely, bleeding into the brain. Your risk of bleeding is increased if you are also taking non-steroidal anti-inflammatory drugs (NSAIDs). °· Increased bruising. °· Upset stomach. °· An allergic reaction. People who have nasal polyps have an increased risk of developing an aspirin allergy. °General guidelines °· Take aspirin only as told by your health care provider. Make sure that you understand how much you should take and what form you should take. The two forms of aspirin are: °? Non-enteric-coated.This type of aspirin does not have a coating and is absorbed quickly. This type of aspirin also comes in a chewable form. °? Enteric-coated. This type of aspirin has a coating that releases the medicine very slowly. Enteric-coated aspirin might  cause less stomach upset than non-enteric-coated aspirin. This type of aspirin should not be chewed or crushed. °· Limit alcohol intake to no more than 1 drink a day for nonpregnant women and 2 drinks a day for men. Drinking alcohol increases your risk of bleeding. One drink equals 12 oz of beer, 5 oz of wine, or 1½ oz of hard liquor. °Contact a health care provider if you: °· Have unusual bleeding or bruising. °· Have stomach pain or nausea. °· Have ringing in your ears. °· Have an allergic reaction that causes: °? Hives. °? Itchy skin. °? Swelling of the lips, tongue, or face. °Get help right away if you: °· Notice that your bowel movements are bloody, dark red, or black in color. °· Vomit or cough up blood. °· Have blood in your urine. °· Cough, have noisy breathing (wheeze), or feel short of breath. °· Have chest pain, especially if the pain spreads to the arms, back, neck, or jaw. °· Have a severe headache, or a headache with confusion, or dizziness. °These symptoms may represent a serious problem that is an emergency. Do not wait to see if the symptoms will go away. Get medical help right away. Call your local emergency services (911 in the [place] Do not drive yourself to the hospital. °Summary °· Aspirin can be used to help reduce the risk of blood clots, heart attacks, and other heart-related problems. °· Daily use of aspirin can increase your risk of side effects. Your health care provider will help you determine whether it is safe and beneficial for you to   take aspirin daily.  Take aspirin only as told by your health care provider. Make sure that you understand how much you can take and what form you can take. This information is not intended to replace advice given to you by your health care provider. Make sure you discuss any questions you have with your health care provider. Document Released: 03/05/2008 Document Revised: 01/21/2017 Document Reviewed: 01/21/2017 Elsevier Interactive Patient  Education  2019 [employer].   Cardiac Rehabilitation What is cardiac rehabilitation? Cardiac rehabilitation is a treatment program that helps improve the health and well-being of people who have heart problems. Cardiac rehabilitation includes exercise training, education, and counseling to help you get stronger and return to an active lifestyle. This program can help you get better faster and reduce any future hospital stays. Why might I need cardiac rehabilitation? Cardiac rehabilitation programs can help when you have or have had:  A heart attack.  Heart failure.  Peripheral artery disease.  Coronary artery disease.  Angina.  Lung or breathing problems. Cardiac rehabilitation programs are also used when you have had:  Coronary artery bypass graft surgery.  Heart valve replacement.  Heart stent placement.  Heart transplant.  Aneurysm repair. What are the benefits of cardiac rehabilitation? Cardiac rehabilitation can help:  Reduce problems like chest pain and trouble breathing.  Change risk factors that contribute to heart disease, such as: ? Smoking. ? High blood pressure. ? High cholesterol. ? Diabetes. ? Being out of shape or not active. ? Weighing more than 30% higher than your ideal weight. ? Diet.  Improve your mental outlook so you feel: ? More hopeful. ? Better about yourself. ? More confident about taking care of yourself.  Get support from health experts as well as other people with similar problems.  Learn how to manage and understand your medicines.  Teach your family about your condition and how to participate in your recovery. What happens in cardiac rehabilitation? You will be assessed by a cardiac rehabilitation team. They will check your health history and do a physical exam. You may need blood tests, stress tests, and other evaluations to make sure that you are ready to start cardiac rehabilitation. The cardiac rehabilitation team works  with you to make a plan based on your health and goals. Your program will be tailored to fit you and your needs and may change as you progress. You may work with a health care team that includes:  Doctors.  Nurses.  Dietitians.  Psychologists.  Exercise specialists.  Physical and occupational therapists. What are the phases of cardiac rehabilitation? A cardiac rehabilitation program is often divided into phases. You advance from one phase to the next. Phase One  This phase starts while you are still in the hospital. You may start by walking in your room and then in the hall. You may start some simple exercises with a therapist. Phase Two  This phase begins when you go home or to another facility. This phase may last 8-12 weeks. You will travel to a cardiac rehabilitation center or another place where rehabilitation is offered. You will slowly increase your activity level while being closely watched by a nurse or therapist. Exercises may include a combination of strength or resistance training and cardio or aerobic movement on a treadmill or other machines. Your condition will determine how often and how long these sessions last. In phase two, you may learn how to cook healthy meals, control your blood sugar, and manage your medicines. You may need  help with scheduling or planning how and when to take your medicines. If you have questions about your medicines, it is very important that you talk to your health care provider. Phase Three This phase continues for the rest of your life. There will be less supervision. You may still participate in cardiac rehabilitation activities or become part of a group in your community. You may benefit from talking about your experience with other people who are facing similar challenges. Get help right away if:  You have severe chest discomfort, especially if the pain is crushing or pressure-like and spreads to your arms, back, neck, or jaw. Do not wait  to see if the pain will go away.  You have weakness or numbness in your face, arms, or legs, especially on one side of the body.  Your speech is slurred.  You are confused.  You have a sudden severe headache or loss of vision.  You have shortness of breath.  You are sweating and have nausea.  You feel dizzy or faint.  You are fatigued. These symptoms may represent a serious problem that is an emergency. Do not wait to see if the symptoms will go away. Get medical help right away. Call your local emergency services (911 in the [place] Do not drive yourself to the hospital. This information is not intended to replace advice given to you by your health care provider. Make sure you discuss any questions you have with your health care provider. Document Released: 12/31/2007 Document Revised: 12/14/2017 Document Reviewed: 02/04/2015 Elsevier Interactive Patient Education  2019 [employer]   Acute Coronary Syndrome  Acute coronary syndrome (ACS) is a serious problem in which there is suddenly not enough blood and oxygen reaching the heart. ACS can result in chest pain or a heart attack. This condition is a medical emergency. If you have any symptoms of this condition, get help right away. What are the causes? This condition may be caused by:  Buildup of fat and cholesterol inside of the arteries (atherosclerosis). This is the most common cause. The buildup (plaque) can cause blood vessels in the heart (coronary arteries) to become narrow or blocked, which reduces blood flow to the heart. Plaque can also break off and lead to a clot, which can block an artery and cause a heart attack or stroke.  Sudden tightening of the muscles around the coronary arteries (coronary spasm).  Tearing of a coronary artery (spontaneous coronary artery dissection).  Very low blood pressure (hypotension).  An abnormal heartbeat (arrhythmia).  Other medical conditions that cause a decrease of oxygen to  the heart, such as anemiaorrespiratory failure.  Using cocaine or methamphetamine. What increases the risk? The following factors may make you more likely to develop this condition:  Age. The risk for ACS increases as you get older.  History of chest pain, heart attack, peripheral artery disease, or stroke.  Having taken chemotherapy or immune-suppressing medicines.  Being male.  Family history of chest pain, heart disease, or stroke.  Smoking.  Not exercising enough.  Being overweight.  High cholesterol.  High blood pressure (hypertension).  Diabetes.  Excessive alcohol use. What are the signs or symptoms? Common symptoms of this condition include:  Chest pain. The pain may last a long time, or it may stop and come back (recur). It may feel like: ? Crushing or squeezing. ? Tightness, pressure, fullness, or heaviness.  Arm, neck, jaw, or back pain.  Heartburn or indigestion.  Shortness of breath.  Nausea.  Sudden  cold sweats.  Light-headedness.  Dizziness, or passing out.  Tiredness (fatigue). Sometimes there are no symptoms. How is this diagnosed? This condition may be diagnosed based on:  Your medical history and symptoms.  An electrocardiogram (ECG). This imaging test measures the heart's electrical activity.  Blood tests. Cardiac blood tests may need to be repeated at designated time intervals.  Chest X-ray.  A CT scan of the chest.  A coronary angiogram. This is a procedure in which dye is injected into the bloodstream and then X-rays are taken to show if there is a blockage in a coronary artery.  Exercise stress testing.  Echocardiography. This is a test that uses sound waves to produce detailed images of the heart. How is this treated? The treatment is to restore blood flow to the heart as soon as possible. Treatment for this condition may include:  Oxygen therapy.  Medicines, such as: ? Antiplatelet medicines and blood-thinning  medicines, such as aspirin. These help prevent blood clots. ? Medicine that dissolves any blood clots (fibrinolytic therapy). ? Blood pressure medicines. ? Nitroglycerin. This helps relieve chest pain and widens blood vessels to improve blood flow. ? Pain medicine. ? Cholesterol-lowering medicine.  Surgery, such as: ? Coronary angioplasty with stent placement. This involves placing a small piece of metal that looks like mesh or a spring into a narrow coronary artery. This widens the artery and keep it open. ? Coronary artery bypass surgery. This involves taking a section of a blood vessel from a different part of your body, and placing it on the blocked coronary artery to allow blood to flow around (bypass) the blockage.  Cardiac rehabilitation. This is a program that helps improve your health and well-being. It includes exercise training, education, and counseling to help you recover. Follow these instructions at home: Eating and drinking  Eat a heart-healthy diet that includes whole grains, fruits and vegetables, lean proteins, and low-fat or nonfat dairy products.  Limit how much salt (sodium) you eat as told by your health care provider. Follow instructions from your health care provider about any other eating or drinking restrictions, such as limiting foods that are high in fat and processed sugars.  Use healthy cooking methods such as roasting, grilling, broiling, baking, poaching, steaming, or stir-frying.  Talk with a dietitian to learn about healthy cooking methods and how to eat less sodium. Medicines  Take over-the-counter and prescription medicines only as told by your health care provider.  Do not take these medicines unless your health care provider approves: ? Vitamin supplements that contain vitamin A or vitamin E. ? Nonsteroidal anti-inflammatory drugs (NSAIDs), such as ibuprofen, naproxen, or celecoxib. ? Hormone replacement therapy that contains estrogen. If you are  taking blood thinners:  Talk with your health care provider before you take any medicines that contain aspirin or NSAIDs. These medicines increase your risk for dangerous bleeding.  Take your medicine exactly as told, at the same time every day.  Avoid activities that could cause injury or bruising, and follow instructions about how to prevent falls.  Wear a medical alert bracelet, and carry a card that lists what medicines you take. Activity  Join a cardiac rehabilitation program. An exercise plan will be developed for you.  Ask your health care provider: ? What activities and exercises are safe for you. ? If you should follow specific instructions about lifting, driving, or climbing stairs. Lifestyle  Do not use any products that contain nicotine or tobacco, such as cigarettes and e-cigarettes. If  you need help quitting, ask your health care provider.  If your health care provider says that alcohol is safe for you, limit your alcohol intake to no more than 1 drink a day. One drink equals 12 oz of beer, 5 oz of wine, or 1 oz of hard liquor.  Maintain a healthy weight. If you need to lose weight, work with your health care provider to do so safely. General instructions  Tell all the health care providers who care for you about your heart condition, including your dentist. This may affect the medicines or treatment you receive.  Manage any other health conditions you have, such as hypertension or diabetes. These conditions affect your heart.  Learn ways to manage stress.  Get screened for depression, and get mental health treatment if you need it. People with ACS are at higher risk for depression.  Keep your vaccinations up to date. Get the flu shot (influenza vaccine) every year.  If directed, monitor your blood pressure at home.  Keep all follow-up visits as told by your health care provider. This is important. Contact a health care provider if:  You feel overwhelmed or  sad.  You have trouble doing your daily activities. Get help right away if:  You have pain in your chest, neck, arm, jaw, stomach, or back that recurs, and: ? It lasts for more than a few minutes. ? It is not relieved by taking the medicineyour health care provider prescribed.  You have unexplained: ? Heavy sweating. ? Heartburn or indigestion. ? Nausea or vomiting. ? Shortness of breath. ? Difficulty breathing. ? Fatigue. ? Nervousness or anxiety. ? Weakness. ? Diarrhea. ? Dark stools or blood in your stool.  You have sudden light-headedness or dizziness.  Your blood pressure is higher than 180/120.  You faint.  You have thoughts about hurting yourself. These symptoms may represent a serious problem that is an emergency. Do not wait to see if the symptoms will go away. Get medical help right away. Call your local emergency services (911 in the [place] Do not drive yourself to the hospital. If you ever feel like you may hurt yourself or others, or have thoughts about taking your own life, get help right away. You can go to your nearest emergency department or call:  Emergency services (911 in the [place]  A suicide crisis helpline, such as the National Suicide Prevention Lifeline at 407-863-4100. This is open 24 hours a day. Summary  Acute coronary syndrome (ACS) is when there is not enough blood and oxygen being supplied to the heart. ACS can result in chest pain or a heart attack.  Acute coronary syndrome is a medical emergency. If you have any symptoms of this condition, get help right away.  Treatment includes medicines and procedures to open the blocked arteries and restore blood flow. This information is not intended to replace advice given to you by your health care provider. Make sure you discuss any questions you have with your health care provider. Document Released: 03/23/2005 Document Revised: 12/01/2016 Document Reviewed: 12/01/2016 Elsevier Interactive  Patient Education  2019 [employer].

## 2018-06-01 NOTE — Care Management Note (Signed)
Case Management Note  Patient Details  Name: CAMARI WISHAM MRN: [id number] Date of Birth: 1934/06/28  Subjective/Objective: 84 yo male presented with CP; s/p cath 2/24; will have a staged PCI in several weeks.                Action/Plan: CM met with patient to discuss transitional needs. 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 PCP verified as: Heath Gold, PA-C; pharmacy of choice: [facility]. Brilinta benefits check completed with prior auth needed; patients Medicare Part D does not cover Brilinta, with patient informed. Brilinta 30-day free card was provided, with [facility] pharmacy to filled Rxs prior to patient transitioning home. No further needs from CM.   Expected Discharge Date:  05/31/18               Expected Discharge Plan:  Home/Self Care  In-House Referral:  NA  Discharge planning Services  CM Consult, Medication Assistance(Brilinta benefits check; 30-day Brilinta card)  Post Acute Care Choice:  NA Choice offered to:  NA  DME Arranged:  N/A DME Agency:  NA  HH Arranged:  NA HH Agency:  NA  Status of Service:  Completed, signed off  If discussed at [place] of Stay Meetings, dates discussed:    Additional Comments:  Midge Minium RN, BSN, NCM-BC, ACM-RN [phone] 06/01/2018, 11:39 AM

## 2018-06-01 NOTE — Progress Notes (Signed)
CARDIAC REHAB PHASE I   PRE:  Rate/Rhythm: 80 SR  BP:  Supine:   Sitting: 122/64  Standing:    SaO2:   MODE:  Ambulation: 740 ft   POST:  Rate/Rhythm: 83 SR  BP:  Supine:   Sitting: 130/57  Standing:    SaO2:  0953-1017 Pt walked 740 ft on RA with hand held asst and tolerated well. No CP. To recliner with callbell and chair alarm. Reviewed ex ed which is written down. Encouraged pt not to over do.   Luetta Nutting, RN BSN  06/01/2018 10:13 AM

## 2018-06-01 NOTE — Plan of Care (Signed)
  Problem: Education: Goal: Knowledge of General Education information will improve Description Including pain rating scale, medication(s)/side effects and non-pharmacologic comfort measures Outcome: Progressing   Problem: Clinical Measurements: Goal: Will remain free from infection Outcome: Progressing   Problem: Clinical Measurements: Goal: Respiratory complications will improve Outcome: Progressing   Problem: Clinical Measurements: Goal: Cardiovascular complication will be avoided Outcome: Progressing   Problem: Activity: Goal: Risk for activity intolerance will decrease Outcome: Progressing   Problem: Nutrition: Goal: Adequate nutrition will be maintained Outcome: Progressing   Problem: Coping: Goal: Level of anxiety will decrease Outcome: Progressing   Problem: Elimination: Goal: Will not experience complications related to bowel motility Outcome: Progressing   Problem: Elimination: Goal: Will not experience complications related to urinary retention Outcome: Progressing   Problem: Pain Managment: Goal: General experience of comfort will improve Outcome: Progressing   Problem: Safety: Goal: Ability to remain free from injury will improve Outcome: Progressing   Problem: Skin Integrity: Goal: Risk for impaired skin integrity will decrease Outcome: Progressing

## 2018-06-01 NOTE — Progress Notes (Addendum)
Progress Note  Patient Name: Craig [name] Date of Encounter: 06/01/2018  Primary Cardiologist: No primary care provider on file.   Subjective   Patient doing well this AM. No CP or SHOB. He has ambulated without difficulty. Very appreciative of his care while hospitalized.    Inpatient Medications    Scheduled Meds: . aspirin  81 mg Oral Daily  . atorvastatin  80 mg Oral q1800  . carvedilol  3.125 mg Oral BID WC  . heparin  5,000 Units Subcutaneous Q8H  . lisinopril  40 mg Oral Daily  . sodium chloride flush  3 mL Intravenous Q12H  . ticagrelor  90 mg Oral BID   Continuous Infusions: . sodium chloride     PRN Meds: sodium chloride, acetaminophen, ondansetron (ZOFRAN) IV, oxyCODONE, sodium chloride flush   Vital Signs    Vitals:   06/01/18 0100 06/01/18 0200 06/01/18 0400 06/01/18 0746  BP:   (!) 107/46   Pulse:   (!) 58   Resp: 20 18 (!) 24   Temp:   98.8 F (37.1 C) 98.1 F (36.7 C)  TempSrc:   Oral Oral  SpO2:   98%   Weight:      Height:        Intake/Output Summary (Last 24 hours) at 06/01/2018 0841 Last data filed at 05/31/2018 1700 Gross per 24 hour  Intake 360 ml  Output 490 ml  Net -130 ml   Filed Weights   05/31/18 0800  Weight: 84.2 kg   Telemetry    NSR with occasional bradycardia - Personally Reviewed  ECG    No new EKG  Physical Exam   GEN: No acute distress.   Neck: No JVD Cardiac: RRR, no murmurs, rubs, or gallops.  Respiratory: Clear to auscultation bilaterally. GI: Soft, nontender, non-distended  MS: No edema; No deformity. Neuro:  Nonfocal  Psych: Normal affect   Labs    Chemistry Recent Labs  Lab 05/30/18 1532 05/30/18 1533 05/30/18 1551 05/31/18 0303  NA  --  138 133* 137  K  --  3.7 3.6 3.4*  CL  --   --  102 104  CO2  --   --  19* 23  GLUCOSE  --   --  197* 136*  BUN  --   --  19 18  CREATININE 1.00  --  1.16 1.29*  CALCIUM  --   --  8.6* 8.8*  PROT  --   --  5.9* 5.9*  ALBUMIN  --   --  3.5 3.5    AST  --   --  27 58*  ALT  --   --  15 21  ALKPHOS  --   --  49 51  BILITOT  --   --  0.3 1.0  GFRNONAA  --   --  58* 51*  GFRAA  --   --  >60 59*  ANIONGAP  --   --  12 10    Hematology Recent Labs  Lab 05/30/18 1533 05/30/18 1551 05/31/18 0303  WBC  --  5.9 7.4  RBC  --  4.09* 4.54  HGB 13.3 12.7* 13.5  HCT 39.0 35.8* 39.5  MCV  --  87.5 87.0  MCH  --  31.1 29.7  MCHC  --  35.5 34.2  RDW  --  11.9 11.9  PLT  --  120* 129*   Cardiac Enzymes Recent Labs  Lab 05/30/18 1551 05/30/18 2229  TROPONINI 0.06* 8.76*  No results for input(s): TROPIPOC in the last 168 hours.   BNP Recent Labs  Lab 05/30/18 1933  BNP 40.6    DDimer No results for input(s): DDIMER in the last 168 hours.   Radiology    No results found.  Cardiac Studies   Left Heart Cath 05/30/2018  Acute lateral wall infarction due to thrombotic occlusion of a large first diagonal in its mid segment.  100% stenosis and TIMI grade 0 flow was converted to 0% stenosis with TIMI grade III flow following angioplasty and stenting using a Onyx 2.5 x 18 mm DES deployed at 14 atm.  Widely patent left main  LAD contains luminal irregularities up to 40% throughout the mid segment.  A small second and third diagonal contain up to 80% stenosis.  These diagonals are small.  Circumflex is patent with up to 50% narrowing noted in the mid vessel proximal and distal to the second obtuse marginal.  The right coronary was not selectively engaged but contains a mid vessel eccentric 80% stenosis.  The distal territory was poorly visualized.  Left ventriculography demonstrated anterolateral severe hypokinesis.  Estimated ejection fraction 50%.  LVEDP 19 mmHg.  Recommendations:   Risk factor modification for secondary prevention: High intensity statin, check hemoglobin A1c, blood pressure control to 130/80 mmHg, cessation of tobacco use, and evaluate for possible sleep disturbance.  Consider staged PCI on the mid  right coronary from right femoral approach.  This could be done during this hospital stay or electively in several weeks.  Anticipate discharge in 48 hours.  Patient Profile     84 y.o. male with HTN who presented to the ED with acute onset substernal chest pressure. EKG in EMS concerning for inferior STEMI. Patient subsequently taken for emergent revascularization.   Assessment & Plan    STEMI  CAD s/p DES to the first diagonal  - No CP or SHOB this AM  - Cath with residual RCA stenosis, 80%. Will be arranged for PCI as outpatient  - Continue Carvedilol 3.125 mg BID - Continue ASA and Ticagrelor  - Continue Atorvastatin 80 mg QD - Continue Lisinopril 40 mg QD - Echo with LVEF of 45-50% and suggestive of some increased central venous pressure. No indication for life vest.   Pre-diabetes  - Could consider starting metformin   HTN  - Continue lisinopril and carvedilol   Will discuss the case further with Dr. Irish Lack.   For questions or updates, please contact [place] Please consult [email or web address] for contact info under Cardiology/STEMI.     Signed, Ina Homes, MD  06/01/2018, 8:41 AM    I have examined the patient and reviewed assessment and plan and discussed with patient.  Agree with above as stated.  Right radial site stable.  No angina.  COntinue DAPT with secondary prevention.  Plan for PCI of the RCA in a few weeks with Dr. Tamala Julian.  Meds for LV dysfunction started.  COnsider metformin in the future given prediabetes.   Larae Grooms

## 2018-06-02 NOTE — Telephone Encounter (Signed)
Patient contacted regarding discharge from [facility] on 06/01/18.  Patient understands to follow up with provider Ronie Spies PA-C on 06/13/18 at 0900 at [facility]. Patient understands discharge instructions? yes Patient understands medications and regiment? yes Patient understands to bring all medications to this visit? Yes  Pt and daughter had no further questions or concerns at this time.

## 2018-06-06 NOTE — Progress Notes (Signed)
Transitions of Care Follow Up Call Note  Craig [name] is an 84 y.o. male who presented to [facility] on 05/30/2018.  The patient had the following prescriptions filled at [facility]: Atorvastatin, Carvedilol, Brilinta, Nitroglycerin SL tabs  Patient was called by pharmacist and HIPAA identifiers were verified. The following questions were asked about the prescriptions filled at [facility]:  Has the patient been experiencing any side effects to the medications prescribed? no Understanding of regimen: good Understanding of indications: good Potential of compliance: good  Pharmacist comments: Patient endorsed no issues or concerns   []  Patient's prescriptions filjoeled at the [facility] were transferred to the following pharmacy: [id number] [place], [place] - [address] AT [facility]    [name] 06/06/2018, 6:06 PM Transitions of Care Pharmacy Hours: Monday - Friday 8:30am to 5:00 PM  Phone - [phone]

## 2018-06-08 ENCOUNTER — Encounter: Payer: Self-pay | Admitting: Physician Assistant

## 2018-06-08 NOTE — Progress Notes (Signed)
Cardiology Office Note    Date:  06/13/2018  ID:  ARDELL HEGGE, DOB March 18, 1935, MRN [id number] PCP:  Practice, [facility]  Cardiologist:  Lesleigh Noe, MD   Chief Complaint: f/u MI  History of Present Illness:  Craig [name] is a 84 y.o. male with history of HTN, RBBB, kidney stones, recently diagnosed CAD with inferior STEMI s/p DES to D1, ICM, pre-DM, mild hyperlipidemia, thrombocytopenia, CKD stage II, tobacco abuse who presents for post-hospital follow-up. Prior to recent admission, he did not have much medical history. He was out in the yard when he developed sudden onset CP on 05/30/18. EMS identified STEMI. He underwent emergent cath showing culprit of occlusion of D1 treated with DES, with residual 40% mid LAD, small second and third diagonal with up to 80% stenosis (small vessels), 50% mid Cx, 80% mid RCA, LVEF 50% with anterolateral severe hypokinesis. 2D echo 05/31/18 showed EF 45-50% with severe hypokinesis of the anterolateral wall and the mid inferolateral wall, hypokinesis of the mid anterior wall, pseudonormalization. He was started on ASA/Brilinta to continue at least one year, and also started on carvedilol and atorvastatin. It was recommended to plan for staged PCI of RCA in a few weeks with Dr. Katrinka Blazing. Labs showed trig 151, HDL 31, LDL 91, DC Cr 1.21 (suggestive of CKD stage II), K 3.9, Hgb 13.5, plt 129.  He returns for follow-up overall feeling well, accompanied by wife and daughter. They joke that he's always had one gear his whole life, slow and steady. He has had 2 brief recurrences of a fleeting chest discomfort on the right that came and went as fast as it began but no recurrent anginal pain. No SOB, unusual bleeding or problems with cath site.    Past Medical History:  Diagnosis Date  . CAD (coronary artery disease)    a.  inferior STEMI s/p DES to D1 with residual disease - planned PCI to RCA, EF 45-50%.  . CKD (chronic kidney disease), stage  II   . Hyperlipidemia, mild   . Hypertension   . Ischemic cardiomyopathy   . Kidney stones   . Pre-diabetes   . RBBB   . Thrombocytopenia (HCC)   . Tobacco abuse     Past Surgical History:  Procedure Laterality Date  . CORONARY/GRAFT ACUTE MI REVASCULARIZATION N/A 05/30/2018   Procedure: CORONARY/GRAFT ACUTE MI REVASCULARIZATION;  Surgeon: Lyn Records, MD;  Location: [facility] INVASIVE CV LAB;  Service: Cardiovascular;  Laterality: N/A;  . LEFT HEART CATH AND CORONARY ANGIOGRAPHY N/A 05/30/2018   Procedure: LEFT HEART CATH AND CORONARY ANGIOGRAPHY;  Surgeon: Lyn Records, MD;  Location: [facility] INVASIVE CV LAB;  Service: Cardiovascular;  Laterality: N/A;    Current Medications: Current Meds  Medication Sig  . aspirin 81 MG chewable tablet Chew 1 tablet (81 mg total) by mouth daily.  Marland Kitchen atorvastatin (LIPITOR) 80 MG tablet Take 1 tablet (80 mg total) by mouth daily at 6 PM.  . carvedilol (COREG) 3.125 MG tablet Take 1 tablet (3.125 mg total) by mouth 2 (two) times daily with a meal.  . lisinopril (PRINIVIL,ZESTRIL) 40 MG tablet Take 40 mg by mouth daily with supper.   . nitroGLYCERIN (NITROSTAT) 0.4 MG SL tablet Place 1 tablet (0.4 mg total) under the tongue every 5 (five) minutes as needed.  . ticagrelor (BRILINTA) 90 MG TABS tablet Take 1 tablet (90 mg total) by mouth 2 (two) times daily.      Allergies:   Patient  has no known allergies.   Social History   Socioeconomic History  . Marital status: Married    Spouse name: Not on file  . Number of children: Not on file  . Years of education: Not on file  . Highest education level: Not on file  Occupational History  . Not on file  Social Needs  . Financial resource strain: Not on file  . Food insecurity:    Worry: Not on file    Inability: Not on file  . Transportation needs:    Medical: No    Non-medical: No  Tobacco Use  . Smoking status: Former Smoker    Last attempt to quit: 1960    Years since quitting: 60.2  . Smokeless  tobacco: Current User    Types: Chew  Substance and Sexual Activity  . Alcohol use: Not on file  . Drug use: Never  . Sexual activity: Not on file  Lifestyle  . Physical activity:    Days per week: 5 days    Minutes per session: Not on file  . Stress: Not at all  Relationships  . Social connections:    Talks on phone: Not on file    Gets together: Not on file    Attends religious service: Not on file    Active member of club or organization: Not on file    Attends meetings of clubs or organizations: Not on file    Relationship status: Not on file  Other Topics Concern  . Not on file  Social History Narrative  . Not on file     Family History:  The patient's family history includes Hyperlipidemia in his father; Hypertension in his father.  ROS:   Please see the history of present illness.  All other systems are reviewed and otherwise negative.    PHYSICAL EXAM:   VS:  BP 132/68   Pulse 73   Ht 5\' 8"  (1.727 m)   Wt 182 lb 12.8 oz (82.9 kg)   SpO2 97%   BMI 27.79 kg/m   BMI: Body mass index is 27.79 kg/m. GEN: Well nourished, well developed elderly WM in no acute distress HEENT: normocephalic, atraumatic Neck: no JVD, carotid bruits, or masses Cardiac: RRR; no murmurs, rubs, or gallops, no edema  Respiratory:  clear to auscultation bilaterally, normal work of breathing GI: soft, nontender, nondistended, + BS MS: no deformity or atrophy Skin: warm and dry, no rash, right radial cath site without hematoma or ecchymosis; good pulse. Neuro:  Alert and Oriented x 3, Strength and sensation are intact, follows commands Psych: euthymic mood, full affect  Wt Readings from Last 3 Encounters:  06/13/18 182 lb 12.8 oz (82.9 kg)  05/31/18 185 lb 10 oz (84.2 kg)      Studies/Labs Reviewed:   EKG:  EKG was ordered today and personally reviewed by me and demonstrates NSR 74bpm, RBBB, nonspecific STT changes  Recent Labs: 05/30/2018: B Natriuretic Peptide 40.6; TSH  1.558 05/31/2018: ALT 21; Hemoglobin 13.5; Platelets 129 06/01/2018: BUN 17; Creatinine, Ser 1.21; Potassium 3.9; Sodium 136   Lipid Panel    Component Value Date/Time   CHOL 152 05/30/2018 1551   TRIG 151 (H) 05/30/2018 1551   HDL 31 (L) 05/30/2018 1551   CHOLHDL 4.9 05/30/2018 1551   VLDL 30 05/30/2018 1551   LDLCALC 91 05/30/2018 1551    Additional studies/ records that were reviewed today include: Summarized above    ASSESSMENT & PLAN:   1. CAD with recent MI -  doing well post PCI. Discussed gradual re-introduction of activity. I will reach out to Dr. Katrinka Blazing to inquire about timing of repeat PCI. Risks and benefits of cardiac catheterization have been discussed with the patient.  These include bleeding, infection, kidney damage, stroke, heart attack, death.  The patient understands these risks and is willing to proceed. Will obtain BMET and CBC today to f/u given probable mild CKD stage II and thrombocytopenia by last labs. 2. Ischemic cardiomyopathy - appears euvolemic on exam. Continue BB and ACEI. 3. HTN - BP controlled; was soft at DC from hospital so will maintain current regimen. 4. Hyperlipidemia - check lipids/LFTs in 6 weeks.  Disposition: F/u with Dr. Katrinka Blazing tentatively in 3 months; anticipate he will have post-PCI f/u arranged after staged PCI if this is pursued - TBD.  Medication Adjustments/Labs and Tests Ordered: Current medicines are reviewed at length with the patient today.  Concerns regarding medicines are outlined above. Medication changes, Labs and Tests ordered today are summarized above and listed in the Patient Instructions accessible in Encounters.   Signed, Laurann Montana, PA-C  06/13/2018 9:38 AM    [facility] [address] [place], [place], [place]  [address] Phone: [phone]; Fax: [phone]

## 2018-06-13 ENCOUNTER — Ambulatory Visit (INDEPENDENT_AMBULATORY_CARE_PROVIDER_SITE_OTHER): Payer: Medicare Other | Admitting: Physician Assistant

## 2018-06-13 ENCOUNTER — Encounter: Payer: Self-pay | Admitting: Physician Assistant

## 2018-06-13 VITALS — BP 132/68 | HR 73 | Ht 68.0 in | Wt 182.8 lb

## 2018-06-13 DIAGNOSIS — I255 Ischemic cardiomyopathy: Secondary | ICD-10-CM | POA: Diagnosis not present

## 2018-06-13 DIAGNOSIS — I10 Essential (primary) hypertension: Secondary | ICD-10-CM | POA: Diagnosis not present

## 2018-06-13 DIAGNOSIS — E785 Hyperlipidemia, unspecified: Secondary | ICD-10-CM

## 2018-06-13 DIAGNOSIS — I2510 Atherosclerotic heart disease of native coronary artery without angina pectoris: Secondary | ICD-10-CM | POA: Diagnosis not present

## 2018-06-13 LAB — CBC
Hematocrit: 39.9 % (ref 37.5–51.0)
Hemoglobin: 14 g/dL (ref 13.0–17.7)
MCH: 30.3 pg (ref 26.6–33.0)
MCHC: 35.1 g/dL (ref 31.5–35.7)
MCV: 86 fL (ref 79–97)
Platelets: 195 10*3/uL (ref 150–450)
RBC: 4.62 x10E6/uL (ref 4.14–5.80)
RDW: 12.5 % (ref 11.6–15.4)
WBC: 7.7 10*3/uL (ref 3.4–10.8)

## 2018-06-13 LAB — BASIC METABOLIC PANEL
BUN/Creatinine Ratio: 20 (ref 10–24)
BUN: 26 mg/dL (ref 8–27)
CALCIUM: 9.5 mg/dL (ref 8.6–10.2)
CHLORIDE: 104 mmol/L (ref 96–106)
CO2: 18 mmol/L — ABNORMAL LOW (ref 20–29)
Creatinine, Ser: 1.29 mg/dL — ABNORMAL HIGH (ref 0.76–1.27)
GFR calc Af Amer: 59 mL/min/{1.73_m2} — ABNORMAL LOW (ref >59)
GFR calc non Af Amer: 51 mL/min/{1.73_m2} — ABNORMAL LOW (ref >59)
Glucose: 145 mg/dL — ABNORMAL HIGH (ref 65–99)
Potassium: 4.5 mmol/L (ref 3.5–5.2)
Sodium: 139 mmol/L (ref 134–144)

## 2018-06-13 NOTE — Patient Instructions (Signed)
Medication Instructions:  Your physician recommends that you continue on your current medications as directed. Please refer to the Current Medication list given to you today.  If you need a refill on your cardiac medications before your next appointment, please call your pharmacy.   Lab work: TODAY:  BMET & CBC 6 WEEKS:  FASTING LIPID & LFT  If you have labs (blood work) drawn today and your tests are completely normal, you will receive your results only by: Marland Kitchen MyChart Message (if you have MyChart) OR . A paper copy in the mail If you have any lab test that is abnormal or we need to change your treatment, we will call you to review the results.  Testing/Procedures: None ordered  Follow-Up: Your physician recommends that you schedule a follow-up appointment in: 3 MONTHS WITH DR. Katrinka Blazing  Any Other Special Instructions Will Be Listed Below (If Applicable).

## 2018-06-15 ENCOUNTER — Telehealth: Payer: Self-pay | Admitting: Physician Assistant

## 2018-06-15 NOTE — Telephone Encounter (Signed)
Please let pt know I discussed timing of staged PCI/repeat cath with Dr. Katrinka Blazing. He states, "Okay to schedule with me anytime. Will need to be set up as a right femoral approach or left radial.due to tortuosity in right arm. We should probably go left radial. "  I did pre-cath labs at our OV on Monday. Can we go ahead and get this scheduled on a day when Dr. Katrinka Blazing is in the cath lab? I will forward this to Constance Holster to help since Elliot Cousin was out and Constance Holster works closely with Dr. Katrinka Blazing. Please let me know when it is scheduled so I can do orders. Would hold lisinopril the day of the cath.  Jaykub Mackins PA-C

## 2018-06-16 ENCOUNTER — Encounter: Payer: Self-pay | Admitting: *Deleted

## 2018-06-16 NOTE — Telephone Encounter (Signed)
Spoke with pt and his wife with his permission.  Went over instructions for cath.  Wife repeated instructions back to me.  Wife states that daughter will be bringing pt in for procedure and may call to get instructions as well.  Pt gave verbal permission to speak with his daughters, Craig [name] and [address].  Pt verbalized understanding and was appreciative for call.

## 2018-06-16 NOTE — Telephone Encounter (Signed)
Attempted to contact pt.  Phone line was busy.  Will try again later.

## 2018-06-20 ENCOUNTER — Telehealth: Payer: Self-pay | Admitting: Interventional Cardiology

## 2018-06-20 NOTE — Telephone Encounter (Signed)
° °  Patient's spouse calling to report weakness;1 week Spouse feels the weakness could be coming from medications, but would like to speak with nurse about upcoming stent placement and what to expect.

## 2018-06-20 NOTE — Telephone Encounter (Signed)
I spoke with pt's wife.  She reports pt has been experiencing weakness for the last week. She states "it is like he can't put one foot in front of the other."  Is walking but does it slowly.  Muscles in back of legs feel tight.  Wife reports she felt this way when taking Atorvastatin.  She reports Atorvastatin was new for pt recently and prior to this he had not been on any cholesterol medication. No chest pain or other complaints. I advised wife to have pt hold atorvastatin for now.  I told her I would make Dr. Katrinka Blazing aware. I verbally went over cath instructions with pt's wife.

## 2018-06-21 ENCOUNTER — Telehealth: Payer: Self-pay | Admitting: *Deleted

## 2018-06-21 ENCOUNTER — Other Ambulatory Visit: Payer: Self-pay | Admitting: Interventional Cardiology

## 2018-06-21 NOTE — Telephone Encounter (Signed)
It is okay to hold atorvastatin to see if muscle weakness improves.

## 2018-06-21 NOTE — Telephone Encounter (Addendum)
Pt contacted pre-coronary stent intervention scheduled at [facility] for: Thursday June 23, 2018 7:30 AM Verified arrival time and place: [facility] Main Entrance A at: 5:30 AM  No solid food after midnight prior to cath, clear liquids until 5 AM day of procedure. Contrast allergy: no  Hold: Lisinopril-day before and day of procedure.   Except hold medications AM meds can be  taken pre-cath with sip of water including: ASA 81 mg Brilinta 90 mg  Confirmed patient has responsible person to drive home post procedure and observe 24 hours after arriving home: yes

## 2018-06-22 ENCOUNTER — Telehealth: Payer: Self-pay | Admitting: Interventional Cardiology

## 2018-06-22 NOTE — Telephone Encounter (Signed)
Called from the cath lab and advised to come to ER for evaluation. PCI for tomorrow cancelled.  Wife and patient attempting to determine if they will go to ER.  Blythe Stanford, RN, cath lab pre-procedure coordinator.

## 2018-06-22 NOTE — Telephone Encounter (Signed)
My most recent entry on this patient stated "Blythe Stanford" from cath lab spoke to patient. Correction, Janne Napoleon RN is her correct name. Also recommended ER visit during the phone call.  The following is her documentation: "From: Jolyn Nap, RN Sent: 06/22/2018   1:44 PM EDT To: Laurann Montana, PA-C, Lyn Records, MD Subject: Reschedule                                     Mr Ludeman PCI for 3/19 with Dr Katrinka Blazing,  Dr Katrinka Blazing said his PCI was elective and could wait the recommended 4 weeks due to coronavirus.   Patient also having falls and weakness since seeing Dayna.  See notes for details.  Please reschedule PCI with Dr Katrinka Blazing 4 weeks from now.   Thanks   [employer]"

## 2018-06-22 NOTE — Telephone Encounter (Signed)
Pts daughter, Victorino Dike and pt wife, advised that I spoke with Dr. Ladona Ridgel and he urges them to consider going to the ER... not being able to assess the pt physically but through what they have told me.. there is a potential the pt had a CVA.Marland Kitchen but since they are denying and feel strongly it is med related... pt can hold Lisinopril, continue to hold Atorvastatin, and hold Coreg... although they have no way of checking his BP... I advised them that I will send the message to Dr. Katrinka Blazing re: the procedure tomorrow.. if he still goes for the procedure then there is a possibility that it can get postponed after he is assessed.   However, I will message Dr. Katrinka Blazing... and if anything worsens in the meantime to please call our office back or call EMS.

## 2018-06-22 NOTE — Telephone Encounter (Signed)
   Cardiac Questionnaire:  Late Entry for PCI at [facility] 06/23/18   .  Have you recently travelled abroad or to [place], [place] state, or [place]? no (4) . Do you currently have a fever? no (4) . Have you been in contact with someone that is currently pending confirmation of Covid19 testing or has been confirmed to have the Covid19 virus?  no (4) . Are you currently experiencing fatigue or cough? no (2) . Are you currently experiencing new or worsening shortness of breath at rest or with minimal activity? no (1) . Have you been in contact with someone that was recently sick with fever/cough/fatigue? no (1)   **A score of 4 or more should result in cancellation of the pts cardiology appt.  **A score of  2 should be provided a mask prior to admission into the lobby  *Travel to a high risk area or contact with a confirmed case should stay at home,  away from confirmed patient, monitor symptoms, and reach out to PCP for e-visit,  additional testing.  *ALL PTS W/ FEVER SHOULD BE REFERRED TO PCP FOR E-VISIT*     Primary Cardiologist: Lesleigh Noe, MD   Pt contacted.  History and symptoms reviewed.  Pt will f/u with [facility] provider as scheduled.  Pt. advised that we are restricting visitors at this time and request that only patients present for check-in prior to their appointment.  All other visitors should remain in their car.  If necessary, only one visitor may come with the patient, into the building. For everyone's safety, all patients and visitor entering our practice area should expect to be screened again prior to entering our waiting area.  Bertram Millard, RN  06/22/2018 5:50 PM

## 2018-06-22 NOTE — Telephone Encounter (Addendum)
Pt wife called and since no DPR pt is here and gave permission in the background that I could talk with her but he did not want to talk on the phone.   She is reporting that the pt is still  experiencing increased fatigue and weakness in his extremities.Marland Kitchen they have stopped his Atorvastatin for 2 doses now and no improvement.. feel it is worse.. she said as of yesterday he is only taking very small steps and she is having to support him fully with ambulation and ADL's such as going to the bathroom and dressing himself..She says that he fell yesterday because when he walks he is ambulating he is pulling to the left and will not straighten out his left leg and pulling behind him some. I advised him that he is exhibiting signs of CVA but they got very upset and said it is the medicine. They will not go to the ER.   She says he is not complaining about anything.. he does not "normally" speak to her much.. but she can tell he is not slurring his words, still using both of his arms... no ear pain or dizziness and is feeding himself some cereal today. She says he has been hydrating some but does not drink much usually.    She is very anxious about it since worsening for about 1 week now and even worse today and he is suppose to be having a stent placed tomorrow. They are very upset and feel the meds they have added to him is causing all of this because he has never been on meds before.. they called it all the "dope" we gave him is making him this sick..   Pt does deny chest pain and dizziness.   Will talk with the DOD.

## 2018-06-22 NOTE — Telephone Encounter (Signed)
Follow up     Spouse calling to report weakness continues. Requesting call from nurse

## 2018-06-22 NOTE — Pre-Procedure Instructions (Signed)
Called to discuss if patient having any symptoms related to coronovirus for anticipation of PCI tomorrow.  Pt's daughter states patient has had several falls over the weekend, a little slurred speech, patient unable to work on his own, family is having to help him out of the chair, pt unable to put one foot in front of the other per daughter.    Spoke with Dr Katrinka Blazing, he states the PCI is elective and that can be rescheduled.  He says this may be related to the start of statin drug or it could be something else.  Notified daughter that we will reschedule PCI in the future possible 4 weeks out.  If he develops any chest pain, shortness of breath or any heart related symptoms to please call office.    Suggested to daughter that if patient is having slurred speech, unable to walk and weakness that they should bring patient to the ER.  Thanks  [employer]

## 2018-06-23 ENCOUNTER — Emergency Department (HOSPITAL_COMMUNITY): Payer: Medicare Other

## 2018-06-23 ENCOUNTER — Encounter (HOSPITAL_COMMUNITY): Admission: RE | Payer: Self-pay | Source: Home / Self Care

## 2018-06-23 ENCOUNTER — Other Ambulatory Visit: Payer: Self-pay

## 2018-06-23 ENCOUNTER — Ambulatory Visit (HOSPITAL_COMMUNITY)
Admission: RE | Admit: 2018-06-23 | Payer: Medicare Other | Source: Home / Self Care | Admitting: Interventional Cardiology

## 2018-06-23 ENCOUNTER — Inpatient Hospital Stay (HOSPITAL_COMMUNITY)
Admission: EM | Admit: 2018-06-23 | Discharge: 2018-06-30 | DRG: 026 | Disposition: A | Discharge status: Rehabilitation Facility | Payer: Medicare Other | Attending: Internal Medicine | Admitting: Internal Medicine

## 2018-06-23 ENCOUNTER — Encounter (HOSPITAL_COMMUNITY): Payer: Self-pay | Admitting: Emergency Medicine

## 2018-06-23 DIAGNOSIS — K219 Gastro-esophageal reflux disease without esophagitis: Secondary | ICD-10-CM | POA: Diagnosis present

## 2018-06-23 DIAGNOSIS — F068 Other specified mental disorders due to known physiological condition: Secondary | ICD-10-CM

## 2018-06-23 DIAGNOSIS — R0902 Hypoxemia: Secondary | ICD-10-CM | POA: Diagnosis present

## 2018-06-23 DIAGNOSIS — S065X9A Traumatic subdural hemorrhage with loss of consciousness of unspecified duration, initial encounter: Secondary | ICD-10-CM | POA: Diagnosis not present

## 2018-06-23 DIAGNOSIS — I10 Essential (primary) hypertension: Secondary | ICD-10-CM

## 2018-06-23 DIAGNOSIS — R7303 Prediabetes: Secondary | ICD-10-CM | POA: Diagnosis present

## 2018-06-23 DIAGNOSIS — I252 Old myocardial infarction: Secondary | ICD-10-CM | POA: Diagnosis not present

## 2018-06-23 DIAGNOSIS — G9389 Other specified disorders of brain: Secondary | ICD-10-CM | POA: Diagnosis not present

## 2018-06-23 DIAGNOSIS — R296 Repeated falls: Secondary | ICD-10-CM | POA: Diagnosis present

## 2018-06-23 DIAGNOSIS — E785 Hyperlipidemia, unspecified: Secondary | ICD-10-CM | POA: Diagnosis present

## 2018-06-23 DIAGNOSIS — Z87442 Personal history of urinary calculi: Secondary | ICD-10-CM | POA: Diagnosis not present

## 2018-06-23 DIAGNOSIS — I129 Hypertensive chronic kidney disease with stage 1 through stage 4 chronic kidney disease, or unspecified chronic kidney disease: Secondary | ICD-10-CM | POA: Diagnosis present

## 2018-06-23 DIAGNOSIS — S065X0A Traumatic subdural hemorrhage without loss of consciousness, initial encounter: Secondary | ICD-10-CM | POA: Diagnosis not present

## 2018-06-23 DIAGNOSIS — I255 Ischemic cardiomyopathy: Secondary | ICD-10-CM | POA: Diagnosis not present

## 2018-06-23 DIAGNOSIS — I25811 Atherosclerosis of native coronary artery of transplanted heart without angina pectoris: Secondary | ICD-10-CM | POA: Diagnosis not present

## 2018-06-23 DIAGNOSIS — N183 Chronic kidney disease, stage 3 unspecified: Secondary | ICD-10-CM

## 2018-06-23 DIAGNOSIS — W06XXXA Fall from bed, initial encounter: Secondary | ICD-10-CM | POA: Diagnosis present

## 2018-06-23 DIAGNOSIS — Z7982 Long term (current) use of aspirin: Secondary | ICD-10-CM | POA: Diagnosis not present

## 2018-06-23 DIAGNOSIS — D62 Acute posthemorrhagic anemia: Secondary | ICD-10-CM | POA: Diagnosis not present

## 2018-06-23 DIAGNOSIS — I6200 Nontraumatic subdural hemorrhage, unspecified: Secondary | ICD-10-CM | POA: Diagnosis not present

## 2018-06-23 DIAGNOSIS — R4182 Altered mental status, unspecified: Secondary | ICD-10-CM | POA: Diagnosis not present

## 2018-06-23 DIAGNOSIS — R4189 Other symptoms and signs involving cognitive functions and awareness: Secondary | ICD-10-CM | POA: Diagnosis present

## 2018-06-23 DIAGNOSIS — S069X0S Unspecified intracranial injury without loss of consciousness, sequela: Secondary | ICD-10-CM

## 2018-06-23 DIAGNOSIS — S069X9A Unspecified intracranial injury with loss of consciousness of unspecified duration, initial encounter: Secondary | ICD-10-CM

## 2018-06-23 DIAGNOSIS — Z87891 Personal history of nicotine dependence: Secondary | ICD-10-CM

## 2018-06-23 DIAGNOSIS — Z8349 Family history of other endocrine, nutritional and metabolic diseases: Secondary | ICD-10-CM

## 2018-06-23 DIAGNOSIS — S4992XA Unspecified injury of left shoulder and upper arm, initial encounter: Secondary | ICD-10-CM | POA: Diagnosis not present

## 2018-06-23 DIAGNOSIS — S065XAA Traumatic subdural hemorrhage with loss of consciousness status unknown, initial encounter: Secondary | ICD-10-CM

## 2018-06-23 DIAGNOSIS — I2510 Atherosclerotic heart disease of native coronary artery without angina pectoris: Secondary | ICD-10-CM | POA: Diagnosis present

## 2018-06-23 DIAGNOSIS — Z7902 Long term (current) use of antithrombotics/antiplatelets: Secondary | ICD-10-CM

## 2018-06-23 DIAGNOSIS — Z955 Presence of coronary angioplasty implant and graft: Secondary | ICD-10-CM

## 2018-06-23 DIAGNOSIS — Z8249 Family history of ischemic heart disease and other diseases of the circulatory system: Secondary | ICD-10-CM

## 2018-06-23 DIAGNOSIS — I4510 Unspecified right bundle-branch block: Secondary | ICD-10-CM | POA: Diagnosis not present

## 2018-06-23 LAB — BASIC METABOLIC PANEL
ANION GAP: 9 (ref 5–15)
BUN: 17 mg/dL (ref 8–23)
CO2: 23 mmol/L (ref 22–32)
Calcium: 9.4 mg/dL (ref 8.9–10.3)
Chloride: 105 mmol/L (ref 98–111)
Creatinine, Ser: 1.24 mg/dL (ref 0.61–1.24)
GFR calc Af Amer: >60 mL/min (ref >60)
GFR calc non Af Amer: 54 mL/min — ABNORMAL LOW (ref >60)
Glucose, Bld: 181 mg/dL — ABNORMAL HIGH (ref 70–99)
Potassium: 3.7 mmol/L (ref 3.5–5.1)
Sodium: 137 mmol/L (ref 135–145)

## 2018-06-23 LAB — CK: Total CK: 116 U/L (ref 49–397)

## 2018-06-23 LAB — HEPATIC FUNCTION PANEL
ALT: 17 U/L (ref 0–44)
AST: 26 U/L (ref 15–41)
Albumin: 4 g/dL (ref 3.5–5.0)
Alkaline Phosphatase: 92 U/L (ref 38–126)
Bilirubin, Direct: 0.3 mg/dL — ABNORMAL HIGH (ref 0.0–0.2)
Indirect Bilirubin: 1.2 mg/dL — ABNORMAL HIGH (ref 0.3–0.9)
Total Bilirubin: 1.5 mg/dL — ABNORMAL HIGH (ref 0.3–1.2)
Total Protein: 7.2 g/dL (ref 6.5–8.1)

## 2018-06-23 LAB — CBC
HCT: 42.5 % (ref 39.0–52.0)
HEMOGLOBIN: 14.5 g/dL (ref 13.0–17.0)
MCH: 30.3 pg (ref 26.0–34.0)
MCHC: 34.1 g/dL (ref 30.0–36.0)
MCV: 88.7 fL (ref 80.0–100.0)
Platelets: 179 10*3/uL (ref 150–400)
RBC: 4.79 MIL/uL (ref 4.22–5.81)
RDW: 12.4 % (ref 11.5–15.5)
WBC: 7.9 10*3/uL (ref 4.0–10.5)
nRBC: 0 % (ref 0.0–0.2)

## 2018-06-23 LAB — TROPONIN I: Troponin I: <0.03 ng/mL (ref <0.03)

## 2018-06-23 LAB — CBG MONITORING, ED: Glucose-Capillary: 95 mg/dL (ref 70–99)

## 2018-06-23 SURGERY — CORONARY STENT INTERVENTION
Anesthesia: LOCAL

## 2018-06-23 MED ORDER — SODIUM CHLORIDE 0.9% FLUSH
3.0000 mL | Freq: Once | INTRAVENOUS | Status: AC
  Administered 2018-06-28: 3 mL via INTRAVENOUS

## 2018-06-23 NOTE — ED Notes (Signed)
Patient transported to CT 

## 2018-06-23 NOTE — ED Notes (Signed)
Called lab to add on orders.

## 2018-06-23 NOTE — ED Triage Notes (Signed)
Per family, pt was suppose to have stint placed today however due to a fall the procedure was cancelled.  Family states pt was totally self care on Monday and now is not able to walk to take care of self at all.  Brought to hospital for evaluation.  Pt has also experienced urine retention.

## 2018-06-23 NOTE — ED Notes (Signed)
Pt made aware of the need for urine.

## 2018-06-23 NOTE — ED Provider Notes (Signed)
[facility] EMERGENCY DEPARTMENT Provider Note   CSN: [id number] Arrival date & time: 06/23/18  1913    History   Chief Complaint Chief Complaint  Patient presents with  . Weakness    HPI Craig [name] is a 84 y.o. male.     HPI Patient had MI several weeks ago and had stent placed.  Was in the hospital for 2 days and started on prolonged and aspirin.  Was doing well at home up until 4 days ago.  Since that time is had difficulty ambulating and taking care of himself.  On Tuesday he had a fall of unknown cause.  Landed on his left shoulder and thinks he hit his head.  No definite loss of consciousness.  Denies chest pain or shortness of breath.  He has had some loose stools.  No fever or chills.  Patient lives at home with his elderly wife. Past Medical History:  Diagnosis Date  . CAD (coronary artery disease)    a.  inferior STEMI s/p DES to D1 with residual disease - planned PCI to RCA, EF 45-50%.  . CKD (chronic kidney disease), stage II   . Hyperlipidemia, mild   . Hypertension   . Ischemic cardiomyopathy   . Kidney stones   . Pre-diabetes   . RBBB   . Thrombocytopenia (HCC)   . Tobacco abuse     Patient Active Problem List   Diagnosis Date Noted  . Tobacco use 06/01/2018  . Status post coronary artery stent placement   . Acute ST elevation myocardial infarction (STEMI) of inferolateral wall (HCC) 05/30/2018  . Essential hypertension 05/30/2018  . Hyperlipidemia LDL goal <70 05/30/2018  . Acute ST elevation myocardial infarction (STEMI) of lateral wall (HCC) 05/30/2018    Past Surgical History:  Procedure Laterality Date  . CORONARY/GRAFT ACUTE MI REVASCULARIZATION N/A 05/30/2018   Procedure: CORONARY/GRAFT ACUTE MI REVASCULARIZATION;  Surgeon: Lyn Records, MD;  Location: [facility] INVASIVE CV LAB;  Service: Cardiovascular;  Laterality: N/A;  . LEFT HEART CATH AND CORONARY ANGIOGRAPHY N/A 05/30/2018   Procedure: LEFT HEART CATH AND CORONARY  ANGIOGRAPHY;  Surgeon: Lyn Records, MD;  Location: [facility] INVASIVE CV LAB;  Service: Cardiovascular;  Laterality: N/A;        Home Medications    Prior to Admission medications   Medication Sig Start Date End Date Taking? Authorizing Provider  aspirin 81 MG chewable tablet Chew 1 tablet (81 mg total) by mouth daily. 06/02/18  Yes Arty Baumgartner, NP  nitroGLYCERIN (NITROSTAT) 0.4 MG SL tablet Place 1 tablet (0.4 mg total) under the tongue every 5 (five) minutes as needed. Patient taking differently: Place 0.4 mg under the tongue every 5 (five) minutes as needed for chest pain.  06/01/18  Yes Arty Baumgartner, NP  ticagrelor (BRILINTA) 90 MG TABS tablet Take 1 tablet (90 mg total) by mouth 2 (two) times daily. 06/01/18  Yes Arty Baumgartner, NP  atorvastatin (LIPITOR) 80 MG tablet Take 1 tablet (80 mg total) by mouth daily at 6 PM. Patient not taking: Reported on 06/23/2018 06/01/18   Arty Baumgartner, NP  carvedilol (COREG) 3.125 MG tablet Take 1 tablet (3.125 mg total) by mouth 2 (two) times daily with a meal. Patient not taking: Reported on 06/23/2018 06/01/18   Arty Baumgartner, NP    Family History Family History  Problem Relation Age of Onset  . Hyperlipidemia Father   . Hypertension Father     Social History Social History  Tobacco Use  . Smoking status: Former Smoker    Last attempt to quit: 1960    Years since quitting: 60.2  . Smokeless tobacco: Current User    Types: Chew  Substance Use Topics  . Alcohol use: Not on file  . Drug use: Never     Allergies   Patient has no known allergies.   Review of Systems Review of Systems  Constitutional: Negative for chills and fever.  HENT: Negative for facial swelling.   Eyes: Negative for visual disturbance.  Respiratory: Negative for cough and shortness of breath.   Cardiovascular: Negative for chest pain.  Gastrointestinal: Positive for diarrhea. Negative for abdominal pain, blood in stool, constipation and  nausea.  Genitourinary: Positive for difficulty urinating. Negative for dysuria and flank pain.  Musculoskeletal: Positive for arthralgias, gait problem, myalgias and neck pain. Negative for back pain.  Skin: Positive for wound. Negative for rash.  Neurological: Positive for weakness. Negative for dizziness, speech difficulty, light-headedness, numbness and headaches.  All other systems reviewed and are negative.    Physical Exam Updated Vital Signs BP (!) 145/78   Pulse 78   Temp 98.3 F (36.8 C) (Oral)   Resp 12   Ht 5\' 10"  (1.778 m)   Wt 77.1 kg   SpO2 97%   BMI 24.39 kg/m   Physical Exam Vitals signs and nursing note reviewed.  Constitutional:      Appearance: Normal appearance. He is well-developed.  HENT:     Head: Normocephalic and atraumatic.     Comments: Cranial nerves II through XII intact.  Edentulous    Nose: Nose normal.     Mouth/Throat:     Mouth: Mucous membranes are moist.  Eyes:     Extraocular Movements: Extraocular movements intact.     Pupils: Pupils are equal, round, and reactive to light.  Neck:     Musculoskeletal: Normal range of motion and neck supple. No neck rigidity or muscular tenderness.     Vascular: No carotid bruit.     Comments: No focal posterior midline cervical tenderness to palpation. Cardiovascular:     Rate and Rhythm: Normal rate and regular rhythm.     Heart sounds: No murmur. No friction rub. No gallop.   Pulmonary:     Effort: Pulmonary effort is normal. No respiratory distress.     Breath sounds: Normal breath sounds. No stridor. No wheezing, rhonchi or rales.  Chest:     Chest wall: No tenderness.  Abdominal:     General: Bowel sounds are normal.     Palpations: Abdomen is soft.     Tenderness: There is no abdominal tenderness. There is no right CVA tenderness, left CVA tenderness, guarding or rebound.  Musculoskeletal: Normal range of motion.        General: No tenderness or deformity.     Right lower leg: No edema.      Left lower leg: No edema.     Comments: No midline thoracic or lumbar tenderness.  Patient has diffuse ecchymosis to the left shoulder and down the left arm.  Appears to have full range of motion of the left shoulder and left elbow.  Distal pulses are 2+.  No lower extremity swelling, asymmetry or tenderness.  Lymphadenopathy:     Cervical: No cervical adenopathy.  Skin:    General: Skin is warm and dry.     Findings: No erythema or rash.  Neurological:     General: No focal deficit present.  Mental Status: He is alert and oriented to person, place, and time.     Comments: 5/5 motor in all extremities.  Sensation intact.  Psychiatric:        Mood and Affect: Mood normal.        Behavior: Behavior normal.      ED Treatments / Results  Labs (all labs ordered are listed, but only abnormal results are displayed) Labs Reviewed  BASIC METABOLIC PANEL - Abnormal; Notable for the following components:      Result Value   Glucose, Bld 181 (*)    GFR calc non Af Amer 54 (*)    All other components within normal limits  HEPATIC FUNCTION PANEL - Abnormal; Notable for the following components:   Total Bilirubin 1.5 (*)    Bilirubin, Direct 0.3 (*)    Indirect Bilirubin 1.2 (*)    All other components within normal limits  CBC  TROPONIN I  CK  URINALYSIS, ROUTINE W REFLEX MICROSCOPIC  CBG MONITORING, ED    EKG None  Radiology Ct Head Wo Contrast  Result Date: 06/23/2018 CLINICAL DATA:  Head trauma, on anticoagulation EXAM: CT HEAD WITHOUT CONTRAST CT CERVICAL SPINE WITHOUT CONTRAST TECHNIQUE: Multidetector CT imaging of the head and cervical spine was performed following the standard protocol without intravenous contrast. Multiplanar CT image reconstructions of the cervical spine were also generated. COMPARISON:  None. FINDINGS: CT HEAD FINDINGS Brain: There are bilateral mixed density subdural hematomas. The right subdural hematoma measures 24 mm in thickness and is  predominantly low-density with layering high-density material posteriorly. The left subdural hematoma measures approximately 27 mm in thickness and is isodense with hyperdense areas within it. Mass effect on the underlying cerebral hemispheres. No midline shift. No intracerebral hemorrhage or infarction. No hydrocephalus. Vascular: No hyperdense vessel or unexpected calcification. Skull: No acute calvarial abnormality. Sinuses/Orbits: Visualized paranasal sinuses and mastoids clear. Orbital soft tissues unremarkable. Other: None CT CERVICAL SPINE FINDINGS Alignment: No subluxation Skull base and vertebrae: No acute fracture. No primary bone lesion or focal pathologic process. Soft tissues and spinal canal: No prevertebral fluid or swelling. No visible canal hematoma. Disc levels:  Early anterior spurring. Upper chest: No acute findings Other: None IMPRESSION: Large bilateral subdural hematomas of differing ages. Mass-effect on the underlying cerebral hemispheres. No midline shift. No acute bony abnormality in the cervical spine. These results were called by telephone at the time of interpretation on 06/23/2018 at 10:32 pm to Dr. Loren Racer , who verbally acknowledged these results. Electronically Signed   By: Charlett Nose M.D.   On: 06/23/2018 22:34   Ct Cervical Spine Wo Contrast  Result Date: 06/23/2018 CLINICAL DATA:  Head trauma, on anticoagulation EXAM: CT HEAD WITHOUT CONTRAST CT CERVICAL SPINE WITHOUT CONTRAST TECHNIQUE: Multidetector CT imaging of the head and cervical spine was performed following the standard protocol without intravenous contrast. Multiplanar CT image reconstructions of the cervical spine were also generated. COMPARISON:  None. FINDINGS: CT HEAD FINDINGS Brain: There are bilateral mixed density subdural hematomas. The right subdural hematoma measures 24 mm in thickness and is predominantly low-density with layering high-density material posteriorly. The left subdural hematoma  measures approximately 27 mm in thickness and is isodense with hyperdense areas within it. Mass effect on the underlying cerebral hemispheres. No midline shift. No intracerebral hemorrhage or infarction. No hydrocephalus. Vascular: No hyperdense vessel or unexpected calcification. Skull: No acute calvarial abnormality. Sinuses/Orbits: Visualized paranasal sinuses and mastoids clear. Orbital soft tissues unremarkable. Other: None CT CERVICAL SPINE FINDINGS Alignment:  No subluxation Skull base and vertebrae: No acute fracture. No primary bone lesion or focal pathologic process. Soft tissues and spinal canal: No prevertebral fluid or swelling. No visible canal hematoma. Disc levels:  Early anterior spurring. Upper chest: No acute findings Other: None IMPRESSION: Large bilateral subdural hematomas of differing ages. Mass-effect on the underlying cerebral hemispheres. No midline shift. No acute bony abnormality in the cervical spine. These results were called by telephone at the time of interpretation on 06/23/2018 at 10:32 pm to Dr. Loren Racer , who verbally acknowledged these results. Electronically Signed   By: Charlett Nose M.D.   On: 06/23/2018 22:34   Dg Humerus Left  Result Date: 06/23/2018 CLINICAL DATA:  Larey Seat two days ago. Bruising over the forearm and shoulder. EXAM: LEFT HUMERUS - 2+ VIEW COMPARISON:  None. FINDINGS: There is no evidence of fracture or other focal bone lesions. Soft tissues are unremarkable. IMPRESSION: Negative. Electronically Signed   By: Amie Portland M.D.   On: 06/23/2018 22:28    Procedures Procedures (including critical care time)  Medications Ordered in ED Medications  sodium chloride flush (NS) 0.9 % injection 3 mL (has no administration in time range)   CRITICAL CARE Performed by: Loren Racer Total critical care time: 25 minutes Critical care time was exclusive of separately billable procedures and treating other patients. Critical care was necessary to treat  or prevent imminent or life-threatening deterioration. Critical care was time spent personally by me on the following activities: development of treatment plan with patient and/or surrogate as well as nursing, discussions with consultants, evaluation of patient's response to treatment, examination of patient, obtaining history from patient or surrogate, ordering and performing treatments and interventions, ordering and review of laboratory studies, ordering and review of radiographic studies, pulse oximetry and re-evaluation of patient's condition.  Initial Impression / Assessment and Plan / ED Course  I have reviewed the triage vital signs and the nursing notes.  Pertinent labs & imaging results that were available during my care of the patient were reviewed by me and considered in my medical decision making (see chart for details).        Discussed with neurosurgery.  Recommend holding Brilinta and will consult on patient.  Hospitalist will admit.  Final Clinical Impressions(s) / ED Diagnoses   Final diagnoses:  Bilateral subdural hematomas [facility])    ED Discharge Orders    None       Loren Racer, MD 06/24/18 0004

## 2018-06-24 ENCOUNTER — Inpatient Hospital Stay (HOSPITAL_COMMUNITY): Payer: Medicare Other

## 2018-06-24 ENCOUNTER — Other Ambulatory Visit: Payer: Self-pay | Admitting: Neurosurgery

## 2018-06-24 ENCOUNTER — Encounter (HOSPITAL_COMMUNITY): Payer: Self-pay

## 2018-06-24 DIAGNOSIS — I255 Ischemic cardiomyopathy: Secondary | ICD-10-CM | POA: Diagnosis not present

## 2018-06-24 DIAGNOSIS — Z8349 Family history of other endocrine, nutritional and metabolic diseases: Secondary | ICD-10-CM | POA: Diagnosis not present

## 2018-06-24 DIAGNOSIS — Z87442 Personal history of urinary calculi: Secondary | ICD-10-CM | POA: Diagnosis not present

## 2018-06-24 DIAGNOSIS — Z7902 Long term (current) use of antithrombotics/antiplatelets: Secondary | ICD-10-CM | POA: Diagnosis not present

## 2018-06-24 DIAGNOSIS — F068 Other specified mental disorders due to known physiological condition: Secondary | ICD-10-CM | POA: Diagnosis not present

## 2018-06-24 DIAGNOSIS — R7303 Prediabetes: Secondary | ICD-10-CM | POA: Diagnosis not present

## 2018-06-24 DIAGNOSIS — S069X9A Unspecified intracranial injury with loss of consciousness of unspecified duration, initial encounter: Secondary | ICD-10-CM | POA: Diagnosis not present

## 2018-06-24 DIAGNOSIS — R0902 Hypoxemia: Secondary | ICD-10-CM | POA: Diagnosis not present

## 2018-06-24 DIAGNOSIS — S065X9A Traumatic subdural hemorrhage with loss of consciousness of unspecified duration, initial encounter: Secondary | ICD-10-CM | POA: Diagnosis not present

## 2018-06-24 DIAGNOSIS — W06XXXA Fall from bed, initial encounter: Secondary | ICD-10-CM | POA: Diagnosis present

## 2018-06-24 DIAGNOSIS — I6200 Nontraumatic subdural hemorrhage, unspecified: Secondary | ICD-10-CM | POA: Diagnosis not present

## 2018-06-24 DIAGNOSIS — I2510 Atherosclerotic heart disease of native coronary artery without angina pectoris: Secondary | ICD-10-CM | POA: Diagnosis not present

## 2018-06-24 DIAGNOSIS — E785 Hyperlipidemia, unspecified: Secondary | ICD-10-CM | POA: Diagnosis present

## 2018-06-24 DIAGNOSIS — I252 Old myocardial infarction: Secondary | ICD-10-CM | POA: Diagnosis not present

## 2018-06-24 DIAGNOSIS — R739 Hyperglycemia, unspecified: Secondary | ICD-10-CM | POA: Diagnosis not present

## 2018-06-24 DIAGNOSIS — S069X0S Unspecified intracranial injury without loss of consciousness, sequela: Secondary | ICD-10-CM | POA: Diagnosis not present

## 2018-06-24 DIAGNOSIS — N183 Chronic kidney disease, stage 3 (moderate): Secondary | ICD-10-CM | POA: Diagnosis not present

## 2018-06-24 DIAGNOSIS — R609 Edema, unspecified: Secondary | ICD-10-CM | POA: Diagnosis not present

## 2018-06-24 DIAGNOSIS — Z955 Presence of coronary angioplasty implant and graft: Secondary | ICD-10-CM | POA: Diagnosis not present

## 2018-06-24 DIAGNOSIS — R0602 Shortness of breath: Secondary | ICD-10-CM | POA: Diagnosis not present

## 2018-06-24 DIAGNOSIS — I25811 Atherosclerosis of native coronary artery of transplanted heart without angina pectoris: Secondary | ICD-10-CM | POA: Diagnosis not present

## 2018-06-24 DIAGNOSIS — R296 Repeated falls: Secondary | ICD-10-CM | POA: Diagnosis not present

## 2018-06-24 DIAGNOSIS — Z87891 Personal history of nicotine dependence: Secondary | ICD-10-CM | POA: Diagnosis not present

## 2018-06-24 DIAGNOSIS — D62 Acute posthemorrhagic anemia: Secondary | ICD-10-CM | POA: Diagnosis not present

## 2018-06-24 DIAGNOSIS — I6202 Nontraumatic subacute subdural hemorrhage: Secondary | ICD-10-CM | POA: Diagnosis not present

## 2018-06-24 DIAGNOSIS — K219 Gastro-esophageal reflux disease without esophagitis: Secondary | ICD-10-CM | POA: Diagnosis present

## 2018-06-24 DIAGNOSIS — I10 Essential (primary) hypertension: Secondary | ICD-10-CM | POA: Diagnosis not present

## 2018-06-24 DIAGNOSIS — Z8249 Family history of ischemic heart disease and other diseases of the circulatory system: Secondary | ICD-10-CM | POA: Diagnosis not present

## 2018-06-24 DIAGNOSIS — S065XAA Traumatic subdural hemorrhage with loss of consciousness status unknown, initial encounter: Secondary | ICD-10-CM | POA: Diagnosis present

## 2018-06-24 DIAGNOSIS — G9389 Other specified disorders of brain: Secondary | ICD-10-CM | POA: Diagnosis present

## 2018-06-24 DIAGNOSIS — I6203 Nontraumatic chronic subdural hemorrhage: Secondary | ICD-10-CM | POA: Diagnosis not present

## 2018-06-24 DIAGNOSIS — R5381 Other malaise: Secondary | ICD-10-CM | POA: Diagnosis not present

## 2018-06-24 DIAGNOSIS — S069X9S Unspecified intracranial injury with loss of consciousness of unspecified duration, sequela: Secondary | ICD-10-CM | POA: Diagnosis not present

## 2018-06-24 DIAGNOSIS — S065X0A Traumatic subdural hemorrhage without loss of consciousness, initial encounter: Secondary | ICD-10-CM | POA: Diagnosis not present

## 2018-06-24 DIAGNOSIS — Z7982 Long term (current) use of aspirin: Secondary | ICD-10-CM | POA: Diagnosis not present

## 2018-06-24 DIAGNOSIS — R4189 Other symptoms and signs involving cognitive functions and awareness: Secondary | ICD-10-CM | POA: Diagnosis present

## 2018-06-24 DIAGNOSIS — S065X9D Traumatic subdural hemorrhage with loss of consciousness of unspecified duration, subsequent encounter: Secondary | ICD-10-CM | POA: Diagnosis not present

## 2018-06-24 DIAGNOSIS — I129 Hypertensive chronic kidney disease with stage 1 through stage 4 chronic kidney disease, or unspecified chronic kidney disease: Secondary | ICD-10-CM | POA: Diagnosis not present

## 2018-06-24 DIAGNOSIS — R4182 Altered mental status, unspecified: Secondary | ICD-10-CM | POA: Diagnosis present

## 2018-06-24 DIAGNOSIS — I4510 Unspecified right bundle-branch block: Secondary | ICD-10-CM | POA: Diagnosis present

## 2018-06-24 LAB — URINALYSIS, ROUTINE W REFLEX MICROSCOPIC
Bilirubin Urine: NEGATIVE
Glucose, UA: NEGATIVE mg/dL
HGB URINE DIPSTICK: NEGATIVE
Ketones, ur: NEGATIVE mg/dL
Leukocytes,Ua: NEGATIVE
Nitrite: NEGATIVE
Protein, ur: NEGATIVE mg/dL
Specific Gravity, Urine: 1.023 (ref 1.005–1.030)
pH: 5 (ref 5.0–8.0)

## 2018-06-24 LAB — PROTIME-INR
INR: 1 (ref 0.8–1.2)
PROTHROMBIN TIME: 13.3 s (ref 11.4–15.2)

## 2018-06-24 LAB — BASIC METABOLIC PANEL
Anion gap: 10 (ref 5–15)
BUN: 18 mg/dL (ref 8–23)
CO2: 21 mmol/L — ABNORMAL LOW (ref 22–32)
Calcium: 8.9 mg/dL (ref 8.9–10.3)
Chloride: 107 mmol/L (ref 98–111)
Creatinine, Ser: 1.16 mg/dL (ref 0.61–1.24)
GFR calc Af Amer: >60 mL/min (ref >60)
GFR calc non Af Amer: 58 mL/min — ABNORMAL LOW (ref >60)
Glucose, Bld: 125 mg/dL — ABNORMAL HIGH (ref 70–99)
Potassium: 3.6 mmol/L (ref 3.5–5.1)
Sodium: 138 mmol/L (ref 135–145)

## 2018-06-24 LAB — CBC
HCT: 37.2 % — ABNORMAL LOW (ref 39.0–52.0)
HEMOGLOBIN: 12.6 g/dL — AB (ref 13.0–17.0)
MCH: 29.4 pg (ref 26.0–34.0)
MCHC: 33.9 g/dL (ref 30.0–36.0)
MCV: 86.7 fL (ref 80.0–100.0)
Platelets: 132 10*3/uL — ABNORMAL LOW (ref 150–400)
RBC: 4.29 MIL/uL (ref 4.22–5.81)
RDW: 12.2 % (ref 11.5–15.5)
WBC: 6.7 10*3/uL (ref 4.0–10.5)
nRBC: 0 % (ref 0.0–0.2)

## 2018-06-24 MED ORDER — ONDANSETRON HCL 4 MG/2ML IJ SOLN: 4.0000 mg | Freq: Four times a day (QID) | INTRAMUSCULAR | Status: DC | PRN

## 2018-06-24 MED ORDER — ACETAMINOPHEN 325 MG PO TABS: 650.0000 mg | ORAL_TABLET | Freq: Four times a day (QID) | ORAL | Status: DC | PRN

## 2018-06-24 MED ORDER — ACETAMINOPHEN 650 MG RE SUPP: 650.0000 mg | Freq: Four times a day (QID) | RECTAL | Status: DC | PRN

## 2018-06-24 MED ORDER — ONDANSETRON HCL 4 MG PO TABS: 4.0000 mg | ORAL_TABLET | Freq: Four times a day (QID) | ORAL | Status: DC | PRN

## 2018-06-24 NOTE — ED Notes (Signed)
ED TO INPATIENT HANDOFF REPORT  ED Nurse Name and Phone #: Tray [place], [phone]  S Name/Age/Gender Craig [name] 84 y.o. male Room/Bed: 029C/029C  Code Status   Code Status: Full Code  Home/SNF/Other Home Patient oriented to: self, place, time and situation Is this baseline? Yes   Triage Complete: Triage complete  Chief Complaint unable to walk  Triage Note Per family, pt was suppose to have stint placed today however due to a fall the procedure was cancelled.  Family states pt was totally self care on Monday and now is not able to walk to take care of self at all.  Brought to hospital for evaluation.  Pt has also experienced urine retention.   Allergies No Known Allergies  Level of Care/Admitting Diagnosis ED Disposition    ED Disposition Condition Comment   Admit  Hospital Area: [facility] [[id number]]  Level of Care: Progressive [102]  Diagnosis: Bilateral subdural hematomas [facility]) [[id number]]  Admitting Physician: Wyvonnia Dusky  Attending Physician: Hillary Bow [phone]  Estimated length of stay: 5 - 7 days  Certification:: I certify this patient will need inpatient services for at least 2 midnights  Bed request comments: 4NP if possible per Neurosurg request.  PT Class (Do Not Modify): Inpatient [101]  PT Acc Code (Do Not Modify): Private [1]       B Medical/Surgery History Past Medical History:  Diagnosis Date  . CAD (coronary artery disease)    a.  inferior STEMI s/p DES to D1 with residual disease - planned PCI to RCA, EF 45-50%.  . CKD (chronic kidney disease), stage II   . Hyperlipidemia, mild   . Hypertension   . Ischemic cardiomyopathy   . Kidney stones   . Pre-diabetes   . RBBB   . Thrombocytopenia (HCC)   . Tobacco abuse    Past Surgical History:  Procedure Laterality Date  . CORONARY/GRAFT ACUTE MI REVASCULARIZATION N/A 05/30/2018   Procedure: CORONARY/GRAFT ACUTE MI REVASCULARIZATION;  Surgeon: Lyn Records, MD;  Location: [facility] INVASIVE CV LAB;  Service: Cardiovascular;  Laterality: N/A;  . LEFT HEART CATH AND CORONARY ANGIOGRAPHY N/A 05/30/2018   Procedure: LEFT HEART CATH AND CORONARY ANGIOGRAPHY;  Surgeon: Lyn Records, MD;  Location: [facility] INVASIVE CV LAB;  Service: Cardiovascular;  Laterality: N/A;     A IV Location/Drains/Wounds Patient Lines/Drains/Airways Status   Active Line/Drains/Airways    Name:   Placement date:   Placement time:   Site:   Days:   Peripheral IV 06/23/18 Right Antecubital   06/23/18    2113    Antecubital   1          Intake/Output Last 24 hours No intake or output data in the 24 hours ending 06/24/18 0108  Labs/Imaging Results for orders placed or performed during the hospital encounter of 06/23/18 (from the past 48 hour(s))  Basic metabolic panel     Status: Abnormal   Collection Time: 06/23/18  8:16 PM  Result Value Ref Range   Sodium 137 135 - 145 mmol/L   Potassium 3.7 3.5 - 5.1 mmol/L   Chloride 105 98 - 111 mmol/L   CO2 23 22 - 32 mmol/L   Glucose, Bld 181 (H) 70 - 99 mg/dL   BUN 17 8 - 23 mg/dL   Creatinine, Ser 0.25 0.61 - 1.24 mg/dL   Calcium 9.4 8.9 - 85.2 mg/dL   GFR calc non Af Amer 54 (L) >60 mL/min   GFR calc  Af Amer >60 >60 mL/min   Anion gap 9 5 - 15    Comment: Performed at [facility], [address] [place], [place] [address]  CBC     Status: None   Collection Time: 06/23/18  8:16 PM  Result Value Ref Range   WBC 7.9 4.0 - 10.5 K/uL   RBC 4.79 4.22 - 5.81 MIL/uL   Hemoglobin 14.5 13.0 - 17.0 g/dL   HCT 60.4 54.0 - 98.1 %   MCV 88.7 80.0 - 100.0 fL   MCH 30.3 26.0 - 34.0 pg   MCHC 34.1 30.0 - 36.0 g/dL   RDW 19.1 47.8 - 29.5 %   Platelets 179 150 - 400 K/uL   nRBC 0.0 0.0 - 0.2 %    Comment: Performed at [facility], [address] [place], [place] [address]  Hepatic function panel     Status: Abnormal   Collection Time: 06/23/18  9:53 PM  Result Value Ref Range   Total Protein 7.2 6.5 - 8.1 g/dL    Albumin 4.0 3.5 - 5.0 g/dL   AST 26 15 - 41 U/L   ALT 17 0 - 44 U/L   Alkaline Phosphatase 92 38 - 126 U/L   Total Bilirubin 1.5 (H) 0.3 - 1.2 mg/dL   Bilirubin, Direct 0.3 (H) 0.0 - 0.2 mg/dL   Indirect Bilirubin 1.2 (H) 0.3 - 0.9 mg/dL    Comment: Performed at [facility], [address] [place], [place] [address]  Troponin I - ONCE - STAT     Status: None   Collection Time: 06/23/18  9:53 PM  Result Value Ref Range   Troponin I <0.03 <0.03 ng/mL    Comment: Performed at [facility], [address], [place], [place] [address]  CK     Status: None   Collection Time: 06/23/18  9:53 PM  Result Value Ref Range   Total CK 116 49 - 397 U/L    Comment: Performed at [facility], [address], [place], [place] [address]  CBG monitoring, ED     Status: None   Collection Time: 06/23/18 11:48 PM  Result Value Ref Range   Glucose-Capillary 95 70 - 99 mg/dL  Urinalysis, Routine w reflex microscopic     Status: None   Collection Time: 06/24/18 12:11 AM  Result Value Ref Range   Color, Urine YELLOW YELLOW   APPearance CLEAR CLEAR   Specific Gravity, Urine 1.023 1.005 - 1.030   pH 5.0 5.0 - 8.0   Glucose, UA NEGATIVE NEGATIVE mg/dL   Hgb urine dipstick NEGATIVE NEGATIVE   Bilirubin Urine NEGATIVE NEGATIVE   Ketones, ur NEGATIVE NEGATIVE mg/dL   Protein, ur NEGATIVE NEGATIVE mg/dL   Nitrite NEGATIVE NEGATIVE   Leukocytes,Ua NEGATIVE NEGATIVE    Comment: Performed at [facility], [address], [place], [place] [address]   Ct Head Wo Contrast  Result Date: 06/23/2018 CLINICAL DATA:  Head trauma, on anticoagulation EXAM: CT HEAD WITHOUT CONTRAST CT CERVICAL SPINE WITHOUT CONTRAST TECHNIQUE: Multidetector CT imaging of the head and cervical spine was performed following the standard protocol without intravenous contrast. Multiplanar CT image reconstructions of the cervical spine were also generated. COMPARISON:  None. FINDINGS: CT HEAD FINDINGS Brain: There are  bilateral mixed density subdural hematomas. The right subdural hematoma measures 24 mm in thickness and is predominantly low-density with layering high-density material posteriorly. The left subdural hematoma measures approximately 27 mm in  thickness and is isodense with hyperdense areas within it. Mass effect on the underlying cerebral hemispheres. No midline shift. No intracerebral hemorrhage or infarction. No hydrocephalus. Vascular: No hyperdense vessel or unexpected calcification. Skull: No acute calvarial abnormality. Sinuses/Orbits: Visualized paranasal sinuses and mastoids clear. Orbital soft tissues unremarkable. Other: None CT CERVICAL SPINE FINDINGS Alignment: No subluxation Skull base and vertebrae: No acute fracture. No primary bone lesion or focal pathologic process. Soft tissues and spinal canal: No prevertebral fluid or swelling. No visible canal hematoma. Disc levels:  Early anterior spurring. Upper chest: No acute findings Other: None IMPRESSION: Large bilateral subdural hematomas of differing ages. Mass-effect on the underlying cerebral hemispheres. No midline shift. No acute bony abnormality in the cervical spine. These results were called by telephone at the time of interpretation on 06/23/2018 at 10:32 pm to Dr. Loren Racer , who verbally acknowledged these results. Electronically Signed   By: Charlett Nose M.D.   On: 06/23/2018 22:34   Ct Cervical Spine Wo Contrast  Result Date: 06/23/2018 CLINICAL DATA:  Head trauma, on anticoagulation EXAM: CT HEAD WITHOUT CONTRAST CT CERVICAL SPINE WITHOUT CONTRAST TECHNIQUE: Multidetector CT imaging of the head and cervical spine was performed following the standard protocol without intravenous contrast. Multiplanar CT image reconstructions of the cervical spine were also generated. COMPARISON:  None. FINDINGS: CT HEAD FINDINGS Brain: There are bilateral mixed density subdural hematomas. The right subdural hematoma measures 24 mm in thickness and is  predominantly low-density with layering high-density material posteriorly. The left subdural hematoma measures approximately 27 mm in thickness and is isodense with hyperdense areas within it. Mass effect on the underlying cerebral hemispheres. No midline shift. No intracerebral hemorrhage or infarction. No hydrocephalus. Vascular: No hyperdense vessel or unexpected calcification. Skull: No acute calvarial abnormality. Sinuses/Orbits: Visualized paranasal sinuses and mastoids clear. Orbital soft tissues unremarkable. Other: None CT CERVICAL SPINE FINDINGS Alignment: No subluxation Skull base and vertebrae: No acute fracture. No primary bone lesion or focal pathologic process. Soft tissues and spinal canal: No prevertebral fluid or swelling. No visible canal hematoma. Disc levels:  Early anterior spurring. Upper chest: No acute findings Other: None IMPRESSION: Large bilateral subdural hematomas of differing ages. Mass-effect on the underlying cerebral hemispheres. No midline shift. No acute bony abnormality in the cervical spine. These results were called by telephone at the time of interpretation on 06/23/2018 at 10:32 pm to Dr. Loren Racer , who verbally acknowledged these results. Electronically Signed   By: Charlett Nose M.D.   On: 06/23/2018 22:34   Dg Humerus Left  Result Date: 06/23/2018 CLINICAL DATA:  Larey Seat two days ago. Bruising over the forearm and shoulder. EXAM: LEFT HUMERUS - 2+ VIEW COMPARISON:  None. FINDINGS: There is no evidence of fracture or other focal bone lesions. Soft tissues are unremarkable. IMPRESSION: Negative. Electronically Signed   By: Amie Portland M.D.   On: 06/23/2018 22:28    Pending Labs Unresulted Labs (From admission, onward)    Start     Ordered   06/24/18 0500  CBC  Tomorrow morning,   R     06/24/18 0048   06/24/18 0500  Basic metabolic panel  Tomorrow morning,   R     06/24/18 0048   06/24/18 0049  Protime-INR  Once,   R     06/24/18 0048           Vitals/Pain Today's Vitals   06/23/18 2115 06/23/18 2130 06/23/18 2145 06/23/18 2245  BP: (!) 125/52 129/63 130/72 (!) 145/78  Pulse: 93 85 84 78  Resp: 20 18 20 12   Temp:      TempSrc:      SpO2: 96% 98% 96% 97%  Weight:      Height:      PainSc:        Isolation Precautions No active isolations  Medications Medications  sodium chloride flush (NS) 0.9 % injection 3 mL (has no administration in time range)  acetaminophen (TYLENOL) tablet 650 mg (has no administration in time range)    Or  acetaminophen (TYLENOL) suppository 650 mg (has no administration in time range)  ondansetron (ZOFRAN) tablet 4 mg (has no administration in time range)    Or  ondansetron (ZOFRAN) injection 4 mg (has no administration in time range)    Mobility walks with device High fall risk   Focused Assessments Neuro Assessment Handoff:  Swallow screen pass? Yes  Cardiac Rhythm: Normal sinus rhythm NIH Stroke Scale ( + Modified Stroke Scale Criteria)  Interval: Initial Level of Consciousness (1a.)   : Alert, keenly responsive LOC Questions (1b. )   +: Answers both questions correctly LOC Commands (1c. )   + : Performs both tasks correctly Best Gaze (2. )  +: Partial gaze palsy Visual (3. )  +: No visual loss Facial Palsy (4. )    : Normal symmetrical movements Motor Arm, Left (5a. )   +: Some effort against gravity Motor Arm, Right (5b. )   +: No drift Motor Leg, Left (6a. )   +: Drift Motor Leg, Right (6b. )   +: No drift Limb Ataxia (7. ): Present in one limb Sensory (8. )   +: Mild-to-moderate sensory loss, patient feels pinprick is less sharp or is dull on the affected side, or there is a loss of superficial pain with pinprick, but patient is aware of being touched Best Language (9. )   +: No aphasia Dysarthria (10. ): Normal Extinction/Inattention (11.)   +: No Abnormality Modified SS Total  +: 5 Complete NIHSS TOTAL: 6 Last date known well: 06/20/18   Neuro Assessment:  Exceptions to WDL Neuro Checks:   Initial (06/24/18 0100)  Last Documented NIHSS Modified Score: 5 (06/24/18 0100) Has TPA been given? No If patient is a Neuro Trauma and patient is going to OR before floor call report to 4N Charge nurse: [phone] or [phone]     R Recommendations: See Admitting Provider Note  Report given to:   Additional Notes:

## 2018-06-24 NOTE — H&P (Signed)
History and Physical    Craig [name] YYT:[id number] DOB: 1934/06/22 DOA: 06/23/2018  PCP: Practice, [facility]  Patient coming from: Home  I have personally briefly reviewed patient's old medical records in [facility]  Chief Complaint: Functional decline, Falls  HPI: Craig [name] is a 84 y.o. male with medical history significant of CAD had inferior wall STEMI on 05/30/2018, underwent PCI with DES to D1.  EF 45-50%.  Had actually been doing fairly well since that time.  Did have residual disease and ultimately was planned to do another PCI, this time to the 80% blockage of the RCA, this originally planned for today but was deferred as an elective procedure for 4 weeks due to the coronavirus pandemic.  He had been doing well until earlier this week.  Larey Seat out of bed on Sunday.  Then had fall on Tuesday landing on his left side and arm, unsure if he hit his head.  He has been on Brilinta and ASA following the STEMI and PCI in Feb.  Since those falls he has had progressive mental and functional decline for the past 2 days.  He was independent prior to this, now with slowed mental status, needing maximal assist with walking.   ED Course: CTH reveals large bilateral SDHs, see report for full details.  NS consulted, hospitalist asked to admit.   Review of Systems: As per HPI otherwise 10 point review of systems negative.   Past Medical History:  Diagnosis Date   CAD (coronary artery disease)    a.  inferior STEMI s/p DES to D1 with residual disease - planned PCI to RCA, EF 45-50%.   CKD (chronic kidney disease), stage II    Hyperlipidemia, mild    Hypertension    Ischemic cardiomyopathy    Kidney stones    Pre-diabetes    RBBB    Thrombocytopenia (HCC)    Tobacco abuse     Past Surgical History:  Procedure Laterality Date   CORONARY/GRAFT ACUTE MI REVASCULARIZATION N/A 05/30/2018   Procedure: CORONARY/GRAFT ACUTE MI REVASCULARIZATION;   Surgeon: Lyn Records, MD;  Location: [facility] INVASIVE CV [facility];  Service: Cardiovascular;  Laterality: N/A;   LEFT HEART CATH AND CORONARY ANGIOGRAPHY N/A 05/30/2018   Procedure: LEFT HEART CATH AND CORONARY ANGIOGRAPHY;  Surgeon: Lyn Records, MD;  Location: [facility] INVASIVE CV LAB;  Service: Cardiovascular;  Laterality: N/A;     reports that he quit smoking about 60 years ago. His smokeless tobacco use includes chew. He reports that he does not use drugs. No history on file for alcohol.  No Known Allergies  Family History  Problem Relation Age of Onset   Hyperlipidemia Father    Hypertension Father      Prior to Admission medications   Medication Sig Start Date End Date Taking? Authorizing Provider  aspirin 81 MG chewable tablet Chew 1 tablet (81 mg total) by mouth daily. 06/02/18  Yes Arty Baumgartner, NP  nitroGLYCERIN (NITROSTAT) 0.4 MG SL tablet Place 1 tablet (0.4 mg total) under the tongue every 5 (five) minutes as needed. Patient taking differently: Place 0.4 mg under the tongue every 5 (five) minutes as needed for chest pain.  06/01/18  Yes Arty Baumgartner, NP  ticagrelor (BRILINTA) 90 MG TABS tablet Take 1 tablet (90 mg total) by mouth 2 (two) times daily. 06/01/18  Yes Arty Baumgartner, NP  atorvastatin (LIPITOR) 80 MG tablet Take 1 tablet (80 mg total) by mouth daily at 6 PM.  Patient not taking: Reported on 06/23/2018 06/01/18   Arty Baumgartner, NP    Physical Exam: Vitals:   06/23/18 2115 06/23/18 2130 06/23/18 2145 06/23/18 2245  BP: (!) 125/52 129/63 130/72 (!) 145/78  Pulse: 93 85 84 78  Resp: Temp:      TempSrc:      SpO2: 96% 98% 96% 97%  Weight:      Height:        Constitutional: NAD, calm, comfortable Eyes: PERRL, lids and conjunctivae normal ENMT: Mucous membranes are moist. Posterior pharynx clear of any exudate or lesions.Normal dentition.  Neck: normal, supple, no masses, no thyromegaly Respiratory: clear to auscultation bilaterally,  no wheezing, no crackles. Normal respiratory effort. No accessory muscle use.  Cardiovascular: Regular rate and rhythm, no murmurs / rubs / gallops. No extremity edema. 2+ pedal pulses. No carotid bruits.  Abdomen: no tenderness, no masses palpated. No hepatosplenomegaly. Bowel sounds positive.  Musculoskeletal: no clubbing / cyanosis. No joint deformity upper and lower extremities. Good ROM, no contractures. Normal muscle tone.  Skin: Diffuse ecchymosis to L arm Neurologic: Abnormal finger to nose bilaterally. Psychiatric: AAOx3, slow with answers though   Labs on Admission: I have personally reviewed following labs and imaging studies  CBC: Recent Labs  Lab 06/23/18 2016  WBC 7.9  HGB 14.5  HCT 42.5  MCV 88.7  PLT 179   Basic Metabolic Panel: Recent Labs  Lab 06/23/18 2016  NA 137  K 3.7  CL 105  CO2 23  GLUCOSE 181*  BUN 17  CREATININE 1.24  CALCIUM 9.4   GFR: Estimated Creatinine Clearance: 47.4 mL/min (by C-G formula based on SCr of 1.24 mg/dL). Liver Function Tests: Recent Labs  Lab 06/23/18 2153  AST 26  ALT 17  ALKPHOS 92  BILITOT 1.5*  PROT 7.2  ALBUMIN 4.0   No results for input(s): LIPASE, AMYLASE in the last 168 hours. No results for input(s): AMMONIA in the last 168 hours. Coagulation Profile: No results for input(s): INR, PROTIME in the last 168 hours. Cardiac Enzymes: Recent Labs  Lab 06/23/18 2153  CKTOTAL 116  TROPONINI <0.03   BNP (last 3 results) No results for input(s): PROBNP in the last 8760 hours. HbA1C: No results for input(s): HGBA1C in the last 72 hours. CBG: Recent Labs  Lab 06/23/18 2348  GLUCAP 95   Lipid Profile: No results for input(s): CHOL, HDL, LDLCALC, TRIG, CHOLHDL, LDLDIRECT in the last 72 hours. Thyroid Function Tests: No results for input(s): TSH, T4TOTAL, FREET4, T3FREE, THYROIDAB in the last 72 hours. Anemia Panel: No results for input(s): VITAMINB12, FOLATE, FERRITIN, TIBC, IRON, RETICCTPCT in the last  72 hours. Urine analysis:    Component Value Date/Time   COLORURINE YELLOW 06/24/2018 0011   APPEARANCEUR CLEAR 06/24/2018 0011   LABSPEC 1.023 06/24/2018 0011   PHURINE 5.0 06/24/2018 0011   GLUCOSEU NEGATIVE 06/24/2018 0011   HGBUR NEGATIVE 06/24/2018 0011   BILIRUBINUR NEGATIVE 06/24/2018 0011   KETONESUR NEGATIVE 06/24/2018 0011   PROTEINUR NEGATIVE 06/24/2018 0011   NITRITE NEGATIVE 06/24/2018 0011   LEUKOCYTESUR NEGATIVE 06/24/2018 0011    Radiological Exams on Admission: Ct Head Wo Contrast  Result Date: 06/23/2018 CLINICAL DATA:  Head trauma, on anticoagulation EXAM: CT HEAD WITHOUT CONTRAST CT CERVICAL SPINE WITHOUT CONTRAST TECHNIQUE: Multidetector CT imaging of the head and cervical spine was performed following the standard protocol without intravenous contrast. Multiplanar CT image reconstructions of the cervical spine were also generated. COMPARISON:  None. FINDINGS:  CT HEAD FINDINGS Brain: There are bilateral mixed density subdural hematomas. The right subdural hematoma measures 24 mm in thickness and is predominantly low-density with layering high-density material posteriorly. The left subdural hematoma measures approximately 27 mm in thickness and is isodense with hyperdense areas within it. Mass effect on the underlying cerebral hemispheres. No midline shift. No intracerebral hemorrhage or infarction. No hydrocephalus. Vascular: No hyperdense vessel or unexpected calcification. Skull: No acute calvarial abnormality. Sinuses/Orbits: Visualized paranasal sinuses and mastoids clear. Orbital soft tissues unremarkable. Other: None CT CERVICAL SPINE FINDINGS Alignment: No subluxation Skull base and vertebrae: No acute fracture. No primary bone lesion or focal pathologic process. Soft tissues and spinal canal: No prevertebral fluid or swelling. No visible canal hematoma. Disc levels:  Early anterior spurring. Upper chest: No acute findings Other: None IMPRESSION: Large bilateral  subdural hematomas of differing ages. Mass-effect on the underlying cerebral hemispheres. No midline shift. No acute bony abnormality in the cervical spine. These results were called by telephone at the time of interpretation on 06/23/2018 at 10:32 pm to Dr. Loren Racer , who verbally acknowledged these results. Electronically Signed   By: Charlett Nose M.D.   On: 06/23/2018 22:34   Ct Cervical Spine Wo Contrast  Result Date: 06/23/2018 CLINICAL DATA:  Head trauma, on anticoagulation EXAM: CT HEAD WITHOUT CONTRAST CT CERVICAL SPINE WITHOUT CONTRAST TECHNIQUE: Multidetector CT imaging of the head and cervical spine was performed following the standard protocol without intravenous contrast. Multiplanar CT image reconstructions of the cervical spine were also generated. COMPARISON:  None. FINDINGS: CT HEAD FINDINGS Brain: There are bilateral mixed density subdural hematomas. The right subdural hematoma measures 24 mm in thickness and is predominantly low-density with layering high-density material posteriorly. The left subdural hematoma measures approximately 27 mm in thickness and is isodense with hyperdense areas within it. Mass effect on the underlying cerebral hemispheres. No midline shift. No intracerebral hemorrhage or infarction. No hydrocephalus. Vascular: No hyperdense vessel or unexpected calcification. Skull: No acute calvarial abnormality. Sinuses/Orbits: Visualized paranasal sinuses and mastoids clear. Orbital soft tissues unremarkable. Other: None CT CERVICAL SPINE FINDINGS Alignment: No subluxation Skull base and vertebrae: No acute fracture. No primary bone lesion or focal pathologic process. Soft tissues and spinal canal: No prevertebral fluid or swelling. No visible canal hematoma. Disc levels:  Early anterior spurring. Upper chest: No acute findings Other: None IMPRESSION: Large bilateral subdural hematomas of differing ages. Mass-effect on the underlying cerebral hemispheres. No midline shift.  No acute bony abnormality in the cervical spine. These results were called by telephone at the time of interpretation on 06/23/2018 at 10:32 pm to Dr. Loren Racer , who verbally acknowledged these results. Electronically Signed   By: Charlett Nose M.D.   On: 06/23/2018 22:34   Dg Humerus Left  Result Date: 06/23/2018 CLINICAL DATA:  Larey Seat two days ago. Bruising over the forearm and shoulder. EXAM: LEFT HUMERUS - 2+ VIEW COMPARISON:  None. FINDINGS: There is no evidence of fracture or other focal bone lesions. Soft tissues are unremarkable. IMPRESSION: Negative. Electronically Signed   By: Amie Portland M.D.   On: 06/23/2018 22:28    EKG: Independently reviewed.  Assessment/Plan Principal Problem:   Bilateral subdural hematomas (HCC) Active Problems:   Status post coronary artery stent placement    1. B SDH - 1. Large B SDHs, causing significant functional decline in the past 2 days. 2. Complicated by the fact that he just had PCI with stent placement less than a month ago and is on  ASA + Brilinta. 3. NS consulted, also spoke with Dr. Noemi Chapel with cards, all are agreed: 1. Family is aware that there is risk of stent thrombosis causing STEMI which could be fatal.  Additionally if he had stent thrombosis, cardiology likely would be unable to intervene given CNS bleeding. 2. However if we dont stop the blood thinners, he likely will continue to bleed, have deteriorating mental status and function, and NS wont be able to intervene. 3. Therefore we all agreed best way to proceed is to hold ASA/Brilinta. 4. Ideally to get NS intervention as soon as NS feels like they can do it off of the ASA/brilinta (likely a couple of days). 5. Then will likely be off of ASA/Brilinta for another ~5 days or so after surgery. 4. Tele monitor 5. Q2H neuro checks 6. Call cards back if he has STEMI or s/sx of ACS, but again its less likely that they will be able to intervene. 7. Heart healthy diet if he passes  bedside swallow  DVT prophylaxis: SCDs Code Status: Full code for now (family going to discuss more) Family Communication: Daughter at bedside, the high risk and difficult situation has been explained as above.  She understands risks. Disposition Plan: Home after admit Consults called: Neurosurgery consulted, also did informal consult with Cardiology over phone (who agreed that we didn't really have much choice other than to hold blood thinners). Admission status: Admit to inpatient  Severity of Illness: The appropriate patient status for this patient is INPATIENT. Inpatient status is judged to be reasonable and necessary in order to provide the required intensity of service to ensure the patient's safety. The patient's presenting symptoms, physical exam findings, and initial radiographic and laboratory data in the context of their chronic comorbidities is felt to place them at high risk for further clinical deterioration. Furthermore, it is not anticipated that the patient will be medically stable for discharge from the hospital within 2 midnights of admission. The following factors support the patient status of inpatient.   Complex situation involving acute B subdural hematomas causing mental status and functional decline in a patient who is on ASA+Brilinta for stent placement just last month.  See above discussion.   * I certify that at the point of admission it is my clinical judgment that the patient will require inpatient hospital care spanning beyond 2 midnights from the point of admission due to high intensity of service, high risk for further deterioration and high frequency of surveillance required.*    Yakima Kreitzer M. DO [facility]  How to contact the [facility] Attending or Consulting provider 7A - 7P or covering provider during after hours 7P -7A, for this patient?  1. Check the care team in [facility] and look for a) attending/consulting [facility] provider listed and b) the [facility] team  listed 2. Log into [email or web address]  Am[employer] Physician Scheduling and messaging for groups and whole hospitals  On call and physician scheduling software for group practices, residents, hospitalists and other medical providers for call, clinic, rotation and shift schedules. On[employer] is a hospital-wide system for scheduling doctors and paging doctors on call. EasyPlot is for scientific plotting and data analysis.  [email or web address]  and use [facility]'s universal password to access. If you do not have the password, please contact the hospital operator.  3. Locate the [facility] provider you are looking for under [facility] and page to a number that you can be directly reached. 4. If you still have difficulty reaching the provider, please page  the [facility] (Director on Call) for the Hospitalists listed on amion for assistance.  06/24/2018, 12:54 AM

## 2018-06-24 NOTE — Evaluation (Signed)
Occupational Therapy Evaluation Patient Details Name: Craig [name] MRN: [id number] DOB: May 02, 1934 Today's Date: 06/24/2018    History of Present Illness Craig [name] is a 84 y.o. male with medical history significant of CAD had inferior wall STEMI on 05/30/2018, underwent PCI with DES to D1.  EF 45-50%.  Did have residual disease and ultimately was planned to do another PCI, this time to the 80% blockage of the RCA, this originally planned for today but was deferred as an elective procedure for 4 weeks due to the coronavirus pandemic. Larey Seat out of bed on Sunday.  Then had fall on Tuesday landing on his left side and arm, unsure if he hit his head.  He has been on Brilinta and ASA following the STEMI and PCI in Feb. Since those falls he has had progressive mental and functional decline for the past 2 days.  He was independent prior to this, now with slowed mental status, needing maximal assist with walking.   Clinical Impression   Pt admitted with above diagnoses, with generalized weakness, LUE tone, and cognition status change limiting ability to complete BADL at desired level of ind. Pt dtr present for session and very involved in pt care. PTA, pt living independently with wife, driving and active in community. Dtr reports this status change as "night and day". Upon evaluation, pt is very pleasant and eager to work with therapy. Pt is oriented only to self and unable to recall events surrounding what brought him to admin. Pt with LUE tone that prevents with some volition (can reach to areas, grasp hand and squeeze), but difficult to passively range. When feeding self, mod A with verbal and tactile cues needed, OT noticing sequencing deficits at this time. Pt attempted LB dressing, safety and sequencing deficits noted at this time as well (see below for full ADL description). Pt currently needing mod A +2 with stedy for functional transfers. Currently recommending pt d/c to CIR for continued  intensive neurological rehab to return to functional and ind baseline. Will continue to follow acutely per OT POC.    Follow Up Recommendations  CIR    Equipment Recommendations  Other (comment)(TBD)    Recommendations for Other Services Rehab consult     Precautions / Restrictions Precautions Precautions: Fall Restrictions Weight Bearing Restrictions: No      Mobility Bed Mobility               General bed mobility comments: up in chair on arrival  Transfers Overall transfer level: Needs assistance   Transfers: Sit to/from Stand Sit to Stand: Mod assist;+2 safety/equipment;+2 physical assistance         General transfer comment: mod A for initial power up to stand in steady, cueing for hand placement and support needed on LUE due to tone. Pt able to pull self up in standing once seated on steady    Balance                                           ADL either performed or assessed with clinical judgement   ADL Overall ADL's : Needs assistance/impaired Eating/Feeding: Cueing for sequencing;Cueing for compensatory techinques;Cueing for safety;Sitting;Moderate assistance Eating/Feeding Details (indicate cue type and reason): cueing for sequencing, initiation and termination to feed self meal. Dtr cut up fruit into smaller pieces Grooming: Moderate assistance;Sitting;Cueing for safety;Cueing for sequencing;Cueing for compensatory techniques  Upper Body Bathing: Maximal assistance;Sitting;Cueing for compensatory techniques;Cueing for sequencing;Cueing for safety   Lower Body Bathing: Maximal assistance;Sit to/from stand;Cueing for safety;Cueing for sequencing;Cueing for compensatory techniques   Upper Body Dressing : Maximal assistance;Sitting;Cueing for sequencing;Cueing for compensatory techniques;Cueing for UE precautions   Lower Body Dressing: Maximal assistance;Sit to/from stand;Cueing for compensatory techniques;Cueing for sequencing;Cueing  for safety;Sitting/lateral leans Lower Body Dressing Details (indicate cue type and reason): to pull up sock while seated in recliner, needing guarding when leaning over then cues to use figure 4 position; pt then needing max A due to gen weakness in RUE, tone in LUE Toilet Transfer: Moderate assistance;Grab bars;RW   Toileting- Clothing Manipulation and Hygiene: Maximal assistance;Sit to/from stand;Sitting/lateral lean   Tub/ Shower Transfer: Maximal assistance;Shower seat;Rolling walker;Ambulation   Functional mobility during ADLs: Moderate assistance;+2 for physical assistance;+2 for safety/equipment(used steady) General ADL Comments: pt with decreased cognition this date per dtr, needing variety of cues to complete BADL; LUE tone noted     Vision Baseline Vision/History: Wears glasses Wears Glasses: Reading only Patient Visual Report: No change from baseline Additional Comments: pt able to read clock and sign on wall     Perception     Praxis      Pertinent Vitals/Pain Pain Assessment: No/denies pain     Hand Dominance     Extremity/Trunk Assessment Upper Extremity Assessment Upper Extremity Assessment: Generalized weakness;LUE deficits/detail;RUE deficits/detail RUE Deficits / Details: generalized weakness LUE Deficits / Details: tone present, pt still with volitional reach and grasp, but difficult to passively range; pt with inconsistent sensation recalls on both UEs  LUE Sensation: decreased light touch(further to test proprio) LUE Coordination: decreased fine motor;decreased gross motor   Lower Extremity Assessment Lower Extremity Assessment: Defer to PT evaluation       Communication Communication Communication: No difficulties   Cognition Arousal/Alertness: Awake/alert Behavior During Therapy: WFL for tasks assessed/performed Overall Cognitive Status: Impaired/Different from baseline Area of Impairment: Orientation;Attention;Memory;Following  commands;Safety/judgement;Awareness;Problem solving                 Orientation Level: Disoriented to;Place;Time;Situation Current Attention Level: Sustained Memory: Decreased recall of precautions;Decreased short-term memory Following Commands: Follows one step commands consistently;Follows one step commands with increased time Safety/Judgement: Decreased awareness of safety;Decreased awareness of deficits Awareness: Intellectual Problem Solving: Slow processing;Decreased initiation;Difficulty sequencing;Requires verbal cues;Requires tactile cues General Comments: pt dtr report significant decline in cognitive functioning, pt requiring verbal/tactile cues to engage in multistep tasks such as feeding and functional t/f. Pt also stating nonsensical statements such as "the big white rocks" or "get the potatoes off the floor". He is unable to recall his injury   General Comments  contusion on L GH joint, L arm    Exercises     Shoulder Instructions      Home Living Family/patient expects to be discharged to:: Private residence Living Arrangements: Spouse/significant other Available Help at Discharge: Family;Available 24 hours/day Type of Home: House Home Access: Stairs to enter [employer] of Steps: 2   Home Layout: One level     Bathroom Shower/Tub: Chief Strategy Officer: Standard Bathroom Accessibility: Yes   Home Equipment: None          Prior Functioning/Environment Level of Independence: Independent        Comments: pt dtr reports that this functional decline is "night and day" and he was totally ind and driving prior to incident        OT Problem List: Decreased strength;Decreased activity tolerance;Decreased cognition;Decreased knowledge of  use of DME or AE;Impaired tone;Impaired UE functional use;Decreased range of motion;Impaired balance (sitting and/or standing);Decreased coordination;Decreased safety awareness;Decreased knowledge  of precautions;Impaired sensation      OT Treatment/Interventions: Self-care/ADL training;DME and/or AE instruction;Therapeutic activities;Balance training;Therapeutic exercise;Cognitive remediation/compensation;Neuromuscular education;Patient/family education    OT Goals(Current goals can be found in the care plan section) Acute Rehab OT Goals Patient Stated Goal: to get it all figured out next week OT Goal Formulation: With patient Time For Goal Achievement: 07/08/18 Potential to Achieve Goals: Good  OT Frequency: Min 3X/week   Barriers to D/C:            Co-evaluation PT/OT/SLP Co-Evaluation/Treatment: Yes Reason for Co-Treatment: For patient/therapist safety;To address functional/ADL transfers PT goals addressed during session: Mobility/safety with mobility OT goals addressed during session: ADL's and self-care      AM-PAC OT "6 Clicks" Daily Activity     Outcome Measure Help from another person eating meals?: A Lot Help from another person taking care of personal grooming?: A Lot Help from another person toileting, which includes using toliet, bedpan, or urinal?: A Lot Help from another person bathing (including washing, rinsing, drying)?: A Lot Help from another person to put on and taking off regular upper body clothing?: A Lot Help from another person to put on and taking off regular lower body clothing?: A Lot 6 Click Score: 12   End of Session Equipment Utilized During Treatment: Gait belt;Other (comment)(stedy) Nurse Communication: Mobility status  Activity Tolerance: Patient tolerated treatment well Patient left: in chair;with call bell/phone within reach;with nursing/sitter in room  OT Visit Diagnosis: Unsteadiness on feet (R26.81);Other abnormalities of gait and mobility (R26.89);History of falling (Z91.81);Muscle weakness (generalized) (M62.81);Other symptoms and signs involving the nervous system (R29.898);Other symptoms and signs involving cognitive  function;Hemiplegia and hemiparesis Hemiplegia - Right/Left: Left Hemiplegia - dominant/non-dominant: Non-Dominant Hemiplegia - caused by: (B SDHs)                Time: 1610-9604 OT Time Calculation (min): 39 min Charges:  OT General Charges $OT Visit: 1 Visit OT Evaluation $OT Eval Moderate Complexity: 1 Mod  Dalphine Handing, MSOT, OTR/L Behavioral Health OT/ Acute Relief OT [facility] Office: [phone]   Dalphine Handing 06/24/2018, 5:16 PM

## 2018-06-24 NOTE — Progress Notes (Signed)
Rehab Admissions Coordinator Note:  Patient was screened by Clois Dupes for appropriateness for an Inpatient Acute Rehab Consult per PT recs.   Noted plans for possible  Surgery Monday. I will follow his progress.  Clois Dupes RN MSN 06/24/2018, 8:29 PM  I can be reached at [phone].

## 2018-06-24 NOTE — Consult Note (Signed)
Reason for Consult: bilateral SDH Referring Physician: Dr. Caryl PinaYelverton  Craig [name] is an 84 y.o. male.   HPI:  84 year old male presented to the ED tonight after sustaining 2 falls this week.  His fall was on Sunday when he apparently fell out of bed.  He also fell on Tuesday according to his daughter.  He has a history of a recent MI in the last 4 weeks.  He has been taking Brilinta and aspirin.  He reports a headache but no visual changes, nausea vomiting or dizziness.  According to his daughter he has declined in the last 2 days.  He went from a very independent 84 year old to needing maximum assist with walking.  He is awake alert and conversing in bed upon examination.  Past Medical History:  Diagnosis Date  . CAD (coronary artery disease)    a.  inferior STEMI s/p DES to D1 with residual disease - planned PCI to RCA, EF 45-50%.  . CKD (chronic kidney disease), stage II   . Hyperlipidemia, mild   . Hypertension   . Ischemic cardiomyopathy   . Kidney stones   . Pre-diabetes   . RBBB   . Thrombocytopenia (HCC)   . Tobacco abuse     Past Surgical History:  Procedure Laterality Date  . CORONARY/GRAFT ACUTE MI REVASCULARIZATION N/A 05/30/2018   Procedure: CORONARY/GRAFT ACUTE MI REVASCULARIZATION;  Surgeon: Lyn RecordsSmith, Henry W, MD;  [facility];  Service: Cardiovascular;  Laterality: N/A;  . LEFT HEART CATH AND CORONARY ANGIOGRAPHY N/A 05/30/2018   Procedure: LEFT HEART CATH AND CORONARY ANGIOGRAPHY;  Surgeon: Lyn RecordsSmith, Henry W, MD;  Location: [facility] INVASIVE CV LAB;  Service: Cardiovascular;  Laterality: N/A;    No Known Allergies  Social History   Tobacco Use  . Smoking status: Former Smoker    Last attempt to quit: 1960    Years since quitting: 60.2  . Smokeless tobacco: Current User    Types: Chew  Substance Use Topics  . Alcohol use: Not on file    Family History  Problem Relation Age of Onset  . Hyperlipidemia Father   . Hypertension Father      Review of  Systems  Positive ROS: As above  All other systems have been reviewed and were otherwise negative with the exception of those mentioned in the HPI and as above.  Objective: Vital signs in last 24 hours: Temp:  [98.3 F (36.8 C)] 98.3 F (36.8 C) (03/19 1920) Pulse Rate:  [78-101] 78 (03/19 2245) Resp:  [12-21] 12 (03/19 2245) BP: (125-157)/(52-82) 145/78 (03/19 2245) SpO2:  [96 %-100 %] 97 % (03/19 2245) Weight:  [77.1 kg] 77.1 kg (03/19 1939)  General Appearance: Alert, cooperative, no distress, appears stated age Head: Normocephalic, without obvious abnormality, atraumatic Eyes: PERRL, conjunctiva/corneas clear, EOM's intact, fundi benign, both eyes      Lungs:  respirations unlabored Heart: Regular rate and rhythm Extremities: Extremities normal, atraumatic, no cyanosis or edema, multiple large bruises and skin tears on all extremities from recent falls Pulses: 2+ and symmetric all extremities Skin: Skin color, texture, turgor normal, no rashes or lesions,   NEUROLOGIC:   Mental status: A&O x4, no aphasia, good attention span, Memory and fund of knowledge Motor Exam - grossly normal, normal tone and bulk Sensory Exam - grossly normal Reflexes: symmetric, no pathologic reflexes, No Hoffman's, No clonus Coordination - grossly normal Gait -not tested Balance -tested Cranial Nerves: I: smell Not tested  II: visual acuity  OS: na  OD: na  II: visual fields Full to confrontation  II: pupils Equal, round, reactive to light  III,VII: ptosis None  III,IV,VI: extraocular muscles  Full ROM  V: mastication Normal  V: facial light touch sensation  Normal  V,VII: corneal reflex  Present  VII: facial muscle function - upper  Normal  VII: facial muscle function - lower Normal  VIII: hearing Not tested  IX: soft palate elevation  Normal  IX,X: gag reflex Present  XI: trapezius strength  5/5  XI: sternocleidomastoid strength 5/5  XI: neck flexion strength  5/5  XII: tongue  strength  Normal    Data Review Lab Results  Component Value Date   WBC 7.9 06/23/2018   HGB 14.5 06/23/2018   HCT 42.5 06/23/2018   MCV 88.7 06/23/2018   PLT 179 06/23/2018   Lab Results  Component Value Date   NA 137 06/23/2018   K 3.7 06/23/2018   CL 105 06/23/2018   CO2 23 06/23/2018   BUN 17 06/23/2018   CREATININE 1.24 06/23/2018   GLUCOSE 181 (H) 06/23/2018   Lab Results  Component Value Date   INR 1.4 05/30/2018    Radiology: Ct Head Wo Contrast  Result Date: 06/23/2018 CLINICAL DATA:  Head trauma, on anticoagulation EXAM: CT HEAD WITHOUT CONTRAST CT CERVICAL SPINE WITHOUT CONTRAST TECHNIQUE: Multidetector CT imaging of the head and cervical spine was performed following the standard protocol without intravenous contrast. Multiplanar CT image reconstructions of the cervical spine were also generated. COMPARISON:  None. FINDINGS: CT HEAD FINDINGS Brain: There are bilateral mixed density subdural hematomas. The right subdural hematoma measures 24 mm in thickness and is predominantly low-density with layering high-density material posteriorly. The left subdural hematoma measures approximately 27 mm in thickness and is isodense with hyperdense areas within it. Mass effect on the underlying cerebral hemispheres. No midline shift. No intracerebral hemorrhage or infarction. No hydrocephalus. Vascular: No hyperdense vessel or unexpected calcification. Skull: No acute calvarial abnormality. Sinuses/Orbits: Visualized paranasal sinuses and mastoids clear. Orbital soft tissues unremarkable. Other: None CT CERVICAL SPINE FINDINGS Alignment: No subluxation Skull base and vertebrae: No acute fracture. No primary bone lesion or focal pathologic process. Soft tissues and spinal canal: No prevertebral fluid or swelling. No visible canal hematoma. Disc levels:  Early anterior spurring. Upper chest: No acute findings Other: None IMPRESSION: Large bilateral subdural hematomas of differing ages.  Mass-effect on the underlying cerebral hemispheres. No midline shift. No acute bony abnormality in the cervical spine. These results were called by telephone at the time of interpretation on 06/23/2018 at 10:32 pm to Dr. Loren Racer , who verbally acknowledged these results. Electronically Signed   By: Charlett Nose M.D.   On: 06/23/2018 22:34   Ct Cervical Spine Wo Contrast  Result Date: 06/23/2018 CLINICAL DATA:  Head trauma, on anticoagulation EXAM: CT HEAD WITHOUT CONTRAST CT CERVICAL SPINE WITHOUT CONTRAST TECHNIQUE: Multidetector CT imaging of the head and cervical spine was performed following the standard protocol without intravenous contrast. Multiplanar CT image reconstructions of the cervical spine were also generated. COMPARISON:  None. FINDINGS: CT HEAD FINDINGS Brain: There are bilateral mixed density subdural hematomas. The right subdural hematoma measures 24 mm in thickness and is predominantly low-density with layering high-density material posteriorly. The left subdural hematoma measures approximately 27 mm in thickness and is isodense with hyperdense areas within it. Mass effect on the underlying cerebral hemispheres. No midline shift. No intracerebral hemorrhage or infarction. No hydrocephalus. Vascular: No hyperdense vessel or unexpected calcification. Skull: No  acute calvarial abnormality. Sinuses/Orbits: Visualized paranasal sinuses and mastoids clear. Orbital soft tissues unremarkable. Other: None CT CERVICAL SPINE FINDINGS Alignment: No subluxation Skull base and vertebrae: No acute fracture. No primary bone lesion or focal pathologic process. Soft tissues and spinal canal: No prevertebral fluid or swelling. No visible canal hematoma. Disc levels:  Early anterior spurring. Upper chest: No acute findings Other: None IMPRESSION: Large bilateral subdural hematomas of differing ages. Mass-effect on the underlying cerebral hemispheres. No midline shift. No acute bony abnormality in the  cervical spine. These results were called by telephone at the time of interpretation on 06/23/2018 at 10:32 pm to Dr. Loren Racer , who verbally acknowledged these results. Electronically Signed   By: Charlett Nose M.D.   On: 06/23/2018 22:34   Dg Humerus Left  Result Date: 06/23/2018 CLINICAL DATA:  Larey Seat two days ago. Bruising over the forearm and shoulder. EXAM: LEFT HUMERUS - 2+ VIEW COMPARISON:  None. FINDINGS: There is no evidence of fracture or other focal bone lesions. Soft tissues are unremarkable. IMPRESSION: Negative. Electronically Signed   By: Amie Portland M.D.   On: 06/23/2018 22:28    Assessment/Plan: 84 year old male with a recent history of an MI in the last 4 weeks on aspirin and Brilinta with multiple falls this week.  CT head shows large bilateral density subdural hematomas, right measuring 24 mm and left measuring 27 mm with some mass-effect but no midline shift.  This is a very difficult situation as he is on Brilinta for his recent placement.  I do believe that the risks outweighs the benefits with continued Brilinta.  Medicine doctor did discuss this with cardiology and they do agree that stopping Brilinta is in his best interest right now.  Appreciate medicine to admit to stepdown.  We will continue to watch him over the next couple days while he is off blood thinners.  Acute surgical intervention is not warranted at this time as the risk of bleeding is significantly higher because of his blood thinners.  Follow-up head CT tomorrow morning with Q2 hour neurochecks.  I did discuss all of this with his daughter who was at the bedside.  She understood and agrees.    Craig [name] Craig [name] 06/24/2018 12:41 AM

## 2018-06-24 NOTE — Consult Note (Addendum)
Cardiology Consultation:   Patient ID: Craig [name] MRN: [id number]; DOB: 07/18/1934  Admit date: 06/23/2018 Date of Consult: 06/24/2018  Primary Care Provider: Practice, [facility] Primary Cardiologist: Henry W Smith III, MD  Primary Electrophysiologist:  None    Patient Profile:   Craig [name] is a 84 y.o. male with a hx of STEMI s/p PCI/DESx1 diag, residual disease in RCA, HTN, HL, RBBB, CKD who is being seen today for the evaluation of subdural hematoma on DAPT at the request of Dr. Cram.  History of Present Illness:   Craig [name] is a 84 yo male with PMH noted above. He presented back on 05/30/2018 with acute inferolateral STEMI with PCI/DES x1 to diag with residual disease in the RCA. Placed on DAPT with ASA/Brilinta with plan for one year. Initial plan was to have the patient seen back in the office to discuss intervention. From notes appears the office attempted to call the patient in regards to scheduling this cath. Wife reported the patient was weakness. It was advised they seek medical treatment given concern for possible stroke.   Presented to the ED today with c/o weakness. Feel out of bed on Sunday and then fell again on Tuesday landing on the left arm. CT head with large bilateral SDHs. Seen by neurosurgery and now planned for Bur hole evacuation procedure on Monday. Cardiology is asked to consult in regards to DAPT with ASA and Brilinta.   Past Medical History:  Diagnosis Date  . CAD (coronary artery disease)    a.  inferior STEMI s/p DES to D1 with residual disease - planned PCI to RCA, EF 45-50%.  . CKD (chronic kidney disease), stage II   . Hyperlipidemia, mild   . Hypertension   . Ischemic cardiomyopathy   . Kidney stones   . Pre-diabetes   . RBBB   . Thrombocytopenia (HCC)   . Tobacco abuse     Past Surgical History:  Procedure Laterality Date  . CORONARY/GRAFT ACUTE MI REVASCULARIZATION N/A 05/30/2018   Procedure: CORONARY/GRAFT  ACUTE MI REVASCULARIZATION;  Surgeon: Smith, Henry W, MD;  Location: [facility];  Service: Cardiovascular;  Laterality: N/A;  . LEFT HEART CATH AND CORONARY ANGIOGRAPHY N/A 05/30/2018   Procedure: LEFT HEART CATH AND CORONARY ANGIOGRAPHY;  Surgeon: Smith, Henry W, MD;  Location: [facility] CV [facility];  Service: Cardiovascular;  Laterality: N/A;     Home Medications:  Prior to Admission medications   Medication Sig Start Date End Date Taking? Authorizing Provider  aspirin 81 MG chewable tablet Chew 1 tablet (81 mg total) by mouth daily. 06/02/18  Yes Roberts, Lindsay B, NP  nitroGLYCERIN (NITROSTAT) 0.4 MG SL tablet Place 1 tablet (0.4 mg total) under the tongue every 5 (five) minutes as needed. Patient taking differently: Place 0.4 mg under the tongue every 5 (five) minutes as needed for chest pain.  06/01/18  Yes Roberts, Lindsay B, NP  ticagrelor (BRILINTA) 90 MG TABS tablet Take 1 tablet (90 mg total) by mouth 2 (two) times daily. 06/01/18  Yes Roberts, Lindsay B, NP  atorvastatin (LIPITOR) 80 MG tablet Take 1 tablet (80 mg total) by mouth daily at 6 PM. Patient not taking: Reported on 06/23/2018 06/01/18   Roberts, Lindsay B, NP    Inpatient Medications: Scheduled Meds: . sodium chloride flush  3 mL Intravenous Once   Continuous Infusions:  PRN Meds: acetaminophen **OR** acetaminophen, ondansetron **OR** ondansetron (ZOFRAN) IV  Allergies:   No Known Allergies  Social History:     Social History   Socioeconomic History  . Marital status: Married    Spouse name: Not on file  . Number of children: Not on file  . Years of education: Not on file  . Highest education level: Not on file  Occupational History  . Not on file  Social Needs  . Financial resource strain: Not on file  . Food insecurity:    Worry: Not on file    Inability: Not on file  . Transportation needs:    Medical: No    Non-medical: No  Tobacco Use  . Smoking status: Former Smoker    Last attempt to quit:  1960    Years since quitting: 60.2  . Smokeless tobacco: Current User    Types: Chew  Substance and Sexual Activity  . Alcohol use: Never    Frequency: Never  . Drug use: Never  . Sexual activity: Not on file  Lifestyle  . Physical activity:    Days per week: 5 days    Minutes per session: Not on file  . Stress: Not at all  Relationships  . Social connections:    Talks on phone: Not on file    Gets together: Not on file    Attends religious service: Not on file    Active member of club or organization: Not on file    Attends meetings of clubs or organizations: Not on file    Relationship status: Not on file  . Intimate partner violence:    Fear of current or ex partner: No    Emotionally abused: No    Physically abused: No    Forced sexual activity: No  Other Topics Concern  . Not on file  Social History Narrative  . Not on file    Family History:    Family History  Problem Relation Age of Onset  . Hyperlipidemia Father   . Hypertension Father      ROS:  Please see the history of present illness.   All other ROS reviewed and negative.     Physical Exam/Data:   Vitals:   06/24/18 0440 06/24/18 0739 06/24/18 1129 06/24/18 1600  BP: 140/67  131/70 (!) 145/76  Pulse: 76  66 69  Resp: 15  18 14  Temp:  97.8 F (36.6 C) 97.6 F (36.4 C) 98.1 F (36.7 C)  TempSrc:  Oral Oral Oral  SpO2: 94%  96% 97%  Weight:  81.6 kg    Height:  5' 10" (1.778 m)     No intake or output data in the 24 hours ending 06/24/18 1729 Last 3 Weights 06/24/2018 06/24/2018 06/23/2018  Weight (lbs) 179 lb 14.3 oz 178 lb 2.1 oz 170 lb  Weight (kg) 81.6 kg 80.8 kg 77.111 kg     Body mass index is 25.81 kg/m.  General:  Well nourished, well developed, in no acute distress HEENT: normal Neck: no JVD Vascular: No carotid bruits; FA pulses 2+ bilaterally without bruits  Cardiac:  normal S1, S2; RRR; no murmur  Lungs:  clear to auscultation bilaterally, no wheezing, rhonchi or rales   Abd: soft, nontender, no hepatomegaly  Ext: no edema Musculoskeletal:  No deformities, BUE and BLE strength normal and equal Skin: warm and dry  Neuro:  CNs 2-12 intact, no focal abnormalities noted. Alert but unable to recall events prior to admission.    EKG:  The EKG was personally reviewed and demonstrates:  SR with known RBBB  Relevant CV Studies:  Cath: 05/30/2018     Acute lateral wall infarction due to thrombotic occlusion of a large first diagonal in its mid segment.  100% stenosis and TIMI grade 0 flow was converted to 0% stenosis with TIMI grade III flow following angioplasty and stenting using a Onyx 2.5 x 18 mm DES deployed at 14 atm.  Widely patent left main  LAD contains luminal irregularities up to 40% throughout the mid segment.  A small second and third diagonal contain up to 80% stenosis.  These diagonals are small.  Circumflex is patent with up to 50% narrowing noted in the mid vessel proximal and distal to the second obtuse marginal.  The right coronary was not selectively engaged but contains a mid vessel eccentric 80% stenosis.  The distal territory was poorly visualized.  Left ventriculography demonstrated anterolateral severe hypokinesis.  Estimated ejection fraction 50%.  LVEDP 19 mmHg.  Recommendations:   Risk factor modification for secondary prevention: High intensity statin, check hemoglobin A1c, blood pressure control to 130/80 mmHg, cessation of tobacco use, and evaluate for possible sleep disturbance.  Consider staged PCI on the mid right coronary from right femoral approach.  This could be done during this hospital stay or electively in several weeks.  Anticipate discharge in 48 hours.  TTE: 05/31/2018  IMPRESSIONS    1. The left ventricle has mildly reduced systolic function, with an ejection fraction of 45-50%. The cavity size was normal. Left ventricular diastolic Doppler parameters are consistent with pseudonormalization. Severe  hypokinesis of the anterolateral  wall and the mid inferolateral wall. Hypokinesis of the mid anterior wall.  2. The right ventricle has normal systolic function. The cavity was normal. There is no increase in right ventricular wall thickness.  3. The mitral valve is normal in structure. There is mild mitral annular calcification present. No evidence of mitral valve stenosis. Trivial regurgitation.  4. The tricuspid valve is normal in structure.  5. The aortic valve is tricuspid Mild calcification of the aortic valve. no stenosis of the aortic valve.  6. The pulmonic valve was normal in structure.  7. The aortic root and ascending aorta are normal in size and structure.  8. The inferior vena cava was dilated in size with <50% respiratory variability.  9. No complete TR doppler jet so unable to estimate PA systolic pressure. N  Laboratory Data:  Chemistry Recent Labs  Lab 06/23/18 2016 06/24/18 0553  NA 137 138  K 3.7 3.6  CL 105 107  CO2 23 21*  GLUCOSE 181* 125*  BUN 17 18  CREATININE 1.24 1.16  CALCIUM 9.4 8.9  GFRNONAA 54* 58*  GFRAA >60 >60  ANIONGAP 9 10    Recent Labs  Lab 06/23/18 2153  PROT 7.2  ALBUMIN 4.0  AST 26  ALT 17  ALKPHOS 92  BILITOT 1.5*   Hematology Recent Labs  Lab 06/23/18 2016 06/24/18 0553  WBC 7.9 6.7  RBC 4.79 4.29  HGB 14.5 12.6*  HCT 42.5 37.2*  MCV 88.7 86.7  MCH 30.3 29.4  MCHC 34.1 33.9  RDW 12.4 12.2  PLT 179 132*   Cardiac Enzymes Recent Labs  Lab 06/23/18 2153  TROPONINI <0.03   No results for input(s): TROPIPOC in the last 168 hours.  BNPNo results for input(s): BNP, PROBNP in the last 168 hours.  DDimer No results for input(s): DDIMER in the last 168 hours.  Radiology/Studies:  Ct Head Wo Contrast  Result Date: 06/24/2018 CLINICAL DATA:  84-year-old male with large bilateral subdural hematomas discovered after head trauma on anticoagulation.   EXAM: CT HEAD WITHOUT CONTRAST TECHNIQUE: Contiguous axial images were  obtained from the base of the skull through the vertex without intravenous contrast. COMPARISON:  06/23/2018. FINDINGS: Brain: Mixed density bilateral superior convexity extra-axial hematomas are biconvex, but subdural hematoma is suspected. Layering hematocrit bilaterally. Right frontal convexity hematoma measures up to 25 millimeters in thickness. Left convexity hematoma measures up to 26 millimeters. Associated frontal and parietal mass effect. Trace rightward midline shift (coronal image 36). Basilar cisterns remain normal. No other intracranial hemorrhage identified. Gray-white matter differentiation is within normal limits throughout the brain. No cortically based acute infarct identified. No ventriculomegaly. Vascular: Calcified atherosclerosis at the skull base. Skull: No fracture identified. Sinuses/Orbits: Visualized paranasal sinuses and mastoids are stable and well pneumatized. Other: Negative orbits and scalp soft tissues. IMPRESSION: 1. Bilateral superior convexity mixed density biconvex extra-axial hemorrhage most compatible with subdural hematomas. Thickness up to 25 mm on the right and 26 mm on the left. Mass-effect primarily on the superior frontal and parietal convexities with trace rightward midline shift. Preserved basilar cisterns.  No ventriculomegaly. 2. No skull fracture or other acute intracranial abnormality identified. Electronically Signed   By: Genevie Ann M.D.   On: 06/24/2018 07:26   Ct Head Wo Contrast  Result Date: 06/23/2018 CLINICAL DATA:  Head trauma, on anticoagulation EXAM: CT HEAD WITHOUT CONTRAST CT CERVICAL SPINE WITHOUT CONTRAST TECHNIQUE: Multidetector CT imaging of the head and cervical spine was performed following the standard protocol without intravenous contrast. Multiplanar CT image reconstructions of the cervical spine were also generated. COMPARISON:  None. FINDINGS: CT HEAD FINDINGS Brain: There are bilateral mixed density subdural hematomas. The right subdural  hematoma measures 24 mm in thickness and is predominantly low-density with layering high-density material posteriorly. The left subdural hematoma measures approximately 27 mm in thickness and is isodense with hyperdense areas within it. Mass effect on the underlying cerebral hemispheres. No midline shift. No intracerebral hemorrhage or infarction. No hydrocephalus. Vascular: No hyperdense vessel or unexpected calcification. Skull: No acute calvarial abnormality. Sinuses/Orbits: Visualized paranasal sinuses and mastoids clear. Orbital soft tissues unremarkable. Other: None CT CERVICAL SPINE FINDINGS Alignment: No subluxation Skull base and vertebrae: No acute fracture. No primary bone lesion or focal pathologic process. Soft tissues and spinal canal: No prevertebral fluid or swelling. No visible canal hematoma. Disc levels:  Early anterior spurring. Upper chest: No acute findings Other: None IMPRESSION: Large bilateral subdural hematomas of differing ages. Mass-effect on the underlying cerebral hemispheres. No midline shift. No acute bony abnormality in the cervical spine. These results were called by telephone at the time of interpretation on 06/23/2018 at 10:32 pm to Dr. Julianne Rice , who verbally acknowledged these results. Electronically Signed   By: Rolm Baptise M.D.   On: 06/23/2018 22:34   Ct Cervical Spine Wo Contrast  Result Date: 06/23/2018 CLINICAL DATA:  Head trauma, on anticoagulation EXAM: CT HEAD WITHOUT CONTRAST CT CERVICAL SPINE WITHOUT CONTRAST TECHNIQUE: Multidetector CT imaging of the head and cervical spine was performed following the standard protocol without intravenous contrast. Multiplanar CT image reconstructions of the cervical spine were also generated. COMPARISON:  None. FINDINGS: CT HEAD FINDINGS Brain: There are bilateral mixed density subdural hematomas. The right subdural hematoma measures 24 mm in thickness and is predominantly low-density with layering high-density material  posteriorly. The left subdural hematoma measures approximately 27 mm in thickness and is isodense with hyperdense areas within it. Mass effect on the underlying cerebral hemispheres. No midline shift. No intracerebral hemorrhage or infarction. No hydrocephalus. Vascular:  No hyperdense vessel or unexpected calcification. Skull: No acute calvarial abnormality. Sinuses/Orbits: Visualized paranasal sinuses and mastoids clear. Orbital soft tissues unremarkable. Other: None CT CERVICAL SPINE FINDINGS Alignment: No subluxation Skull base and vertebrae: No acute fracture. No primary bone lesion or focal pathologic process. Soft tissues and spinal canal: No prevertebral fluid or swelling. No visible canal hematoma. Disc levels:  Early anterior spurring. Upper chest: No acute findings Other: None IMPRESSION: Large bilateral subdural hematomas of differing ages. Mass-effect on the underlying cerebral hemispheres. No midline shift. No acute bony abnormality in the cervical spine. These results were called by telephone at the time of interpretation on 06/23/2018 at 10:32 pm to Dr. DAVID YELVERTON , who verbally acknowledged these results. Electronically Signed   By: Kevin  Dover M.D.   On: 06/23/2018 22:34   Dg Humerus Left  Result Date: 06/23/2018 CLINICAL DATA:  Fell two days ago. Bruising over the forearm and shoulder. EXAM: LEFT HUMERUS - 2+ VIEW COMPARISON:  None. FINDINGS: There is no evidence of fracture or other focal bone lesions. Soft tissues are unremarkable. IMPRESSION: Negative. Electronically Signed   By: David  Ormond M.D.   On: 06/23/2018 22:28    Assessment and Plan:   Craig [name] is a 84 y.o. male with a hx of STEMI s/p PCI/DESx1 diag, residual disease in RCA, HTN, HL, RBBB, CKD who is being seen today for the evaluation of subdural hematoma on DAPT at the request of Dr. Cram.  1. Bilateral subdural hematomas: Patient with fall leading to bilateral SDHs on CT head. He has been evaluated by NS  and needs to undergo bur hole evacuation planned for Monday. In regards to his DAPT, has been on ASA and Brilinta prior to admission. He is about a month out from his STEMI with PCI to diag. Given need for surgery will need to stop at least Brilinta, likely ASA as well. PCI was to a smaller diag vessel, thought RCA with mid 95% lesion still. Hopefully will be able to resume DAPT once stable from a NS standpoint with plans for plavix instead of Brilinta.   2. CAD s/p STEMI 2/24: Previously on ASA/Brilinta prior to admission. See above  For questions or updates, please contact [facility] Please consult [email or web address] for contact info under   Signed, Lindsay Roberts, NP  06/24/2018 5:29 PM   Patient seen, examined. Available data reviewed. Agree with findings, assessment, and plan as outlined by Lindsay Roberts, NP.  On my exam, the patient is an alert elderly male in no distress.  He is sitting in a chair at the bedside.  JVP is normal, lungs are clear, heart is regular rate and rhythm, skin is warm and dry with extensive bruising throughout the entire left arm, abdomen is soft and nontender, lower extremities have no deformity, there is 1+ ankle edema bilaterally, there is no skin rash.  The patient is not oriented to all spheres but answers most questions appropriately.  I have personally reviewed his recent cardiac catheterization films when he underwent PCI of an acutely occluded high diagonal branch with a drug-eluting stent.  He is noted to have severe residual stenosis of the RCA with the tight focal stenosis in a large, dominant vessel.  The patient does not appear to have any active anginal symptoms at present.  This is a difficult situation in a patient who underwent PCI less than 30 days ago.  He is at high risk of periprocedural infarct and/or stent thrombosis off of antiplatelet therapy.    However, with symptomatic subdural hematoma, he clearly requires bur hole evacuation to give him the best  opportunity yet functional recovery.  I discussed his case at length with Dr. Cram.  We agree that aspirin and ticagrelor should be held over the weekend in anticipation of surgery on Monday.  It is likely that he will be able to start back on aspirin 81 mg within 24 hours of surgery.  If and when it is determined to be safe for him to start back on dual antiplatelet therapy, recommend using clopidogrel rather than ticagrelor for less potent antiplatelet effects.  Will follow along with you.   , M.D. 06/24/2018 6:12 PM    

## 2018-06-24 NOTE — ED Notes (Signed)
Called neurosurg Meyran to call RN Tray

## 2018-06-24 NOTE — Telephone Encounter (Addendum)
Update - "see notes for details" below refers to cardiology Telephone call from 06/20/18 with Jeannett Senior in which patient's wife reported increased fatigue, weakness, progressive inability to walk and dragging leg. At that time he was advised to go to the ER as she was advised this was not likely a medication reaction but could reflect CVA. Janne Napoleon also had advised patient/wife to proceed to ER for eval ASAP. At that time they became upset and were insistent it was a medicine issue instead. It appears the patient did finally present to ED yesterday and was diagnosed with bilateral subdural hematomas. Arcenia Scarbro PA-C

## 2018-06-24 NOTE — Progress Notes (Signed)
PT Evaluation   Clinical Impression: PTA pt living with wife in single story home with 2 steps to enter and was independent in mobility, ADLs, and driving. Pt currently requires modAx2 for power up in [place]. Pt unable to advance to ambulation due to decreased strength. Pt to have burr hole surgery on Monday at PT will reassess at that time, however given pt prior level of function and support at home pt would be a good candidate for CIR level rehab when medically ready. PT will follow acutely.     06/24/18 1715  PT Visit Information  Last PT Received On 06/24/18  Assistance Needed +2  PT/OT/SLP Co-Evaluation/Treatment Yes  Reason for Co-Treatment Complexity of the patient's impairments (multi-system involvement)  PT goals addressed during session Mobility/safety with mobility  History of Present Illness Craig [name] is a 84 y.o. male with medical history significant of CAD had inferior wall STEMI on 05/30/2018, underwent PCI with DES to D1.  EF 45-50%.  Did have residual disease and ultimately was planned to do another PCI, this time to the 80% blockage of the RCA, this originally planned for today but was deferred as an elective procedure for 4 weeks due to the coronavirus pandemic. Larey Seat out of bed on Sunday.  Then had fall on Tuesday landing on his left side and arm, unsure if he hit his head.  He has been on Brilinta and ASA following the STEMI and PCI in Feb. Since those falls he has had progressive mental and functional decline for the past 2 days.  He was independent prior to this, now with slowed mental status, needing maximal assist with walking.  Precautions  Precautions Fall  Restrictions  Weight Bearing Restrictions No  Home Living  Family/patient expects to be discharged to: Private residence  Living Arrangements Spouse/significant other  Available Help at Discharge Family;Available 24 hours/day  Type of Home House  Home Access Stairs to enter  Entrance Stairs-Number of Steps  2  Home Layout One level  Bathroom Nurse, children's Yes  Home Equipment None  Prior Function  Level of Independence Independent  Comments pt dtr reports that this functional decline is "night and day" and he was totally ind and driving prior to incident  Communication  Communication No difficulties  Pain Assessment  Pain Assessment No/denies pain  Cognition  Arousal/Alertness Awake/alert  Behavior During Therapy WFL for tasks assessed/performed  Overall Cognitive Status Impaired/Different from baseline  Area of Impairment Orientation;Attention;Memory;Following commands;Safety/judgement;Awareness;Problem solving  Orientation Level Disoriented to;Place;Time;Situation  Current Attention Level Sustained  Memory Decreased recall of precautions;Decreased short-term memory  Following Commands Follows one step commands consistently;Follows one step commands with increased time  Safety/Judgement Decreased awareness of safety;Decreased awareness of deficits  Awareness Intellectual  Problem Solving Slow processing;Decreased initiation;Difficulty sequencing;Requires verbal cues;Requires tactile cues  General Comments pt dtr report significant decline in cognitive functioning, pt requiring verbal/tactile cues to engage in multistep tasks such as feeding and functional t/f. Pt also stating nonsensical statements such as "the big white rocks" or "get the potatoes off the floor". He is unable to recall his injury  Upper Extremity Assessment  Upper Extremity Assessment Defer to OT evaluation  RUE Deficits / Details generalized weakness  LUE Deficits / Details tone present, pt still with volitional reach and grasp, but difficult to passively range; pt with inconsistent sensation recalls on both UEs   LUE Sensation decreased light touch (further to test proprio)  LUE Coordination decreased fine  motor;decreased gross motor  Lower Extremity  Assessment  Lower Extremity Assessment RLE deficits/detail;LLE deficits/detail  RLE Deficits / Details PROM WFL, strength grossly assessed at 2/5 in hip, knee and ankle  RLE Sensation WNL  RLE Coordination decreased fine motor;decreased gross motor  LLE Deficits / Details generalized weakness  LLE Sensation WNL  LLE Coordination decreased fine motor  Bed Mobility  General bed mobility comments up in chair on arrival  Transfers  Overall transfer level Needs assistance  Transfer via Lift Equipment Stedy  Transfers Sit to/from Stand  Sit to Stand Mod assist;+2 safety/equipment;+2 physical assistance  General transfer comment mod A for initial power up to stand in steady, cueing for hand placement and support needed on LUE due to tone. Pt able to pull self up in standing once seated on steady  Ambulation/Gait  General Gait Details unable to attempt due to weakness  Balance  Overall balance assessment Needs assistance  Sitting-balance support Bilateral upper extremity supported  Sitting balance-Leahy Scale Poor  General Comments  General comments (skin integrity, edema, etc.) contusion on L GH joint, L arm  PT - End of Session  Equipment Utilized During Treatment Gait belt  Activity Tolerance Patient tolerated treatment well  Patient left in chair;with call bell/phone within reach;with family/visitor present  Nurse Communication Mobility status;Need for lift equipment  PT Assessment  PT Recommendation/Assessment Patient needs continued PT services  PT Visit Diagnosis Unsteadiness on feet (R26.81);Other abnormalities of gait and mobility (R26.89);History of falling (Z91.81);Muscle weakness (generalized) (M62.81);Difficulty in walking, not elsewhere classified (R26.2);Other symptoms and signs involving the nervous system (R29.898)  PT Problem List Decreased strength;Decreased activity tolerance;Decreased range of motion;Decreased balance;Decreased mobility;Decreased cognition;Decreased  safety awareness;Impaired sensation  PT Plan  PT Frequency (ACUTE ONLY) Min 3X/week  PT Treatment/Interventions (ACUTE ONLY) DME instruction;Gait training;Stair training;Functional mobility training;Therapeutic activities;Therapeutic exercise;Balance training;Cognitive remediation;Patient/family education  AM-PAC PT "6 Clicks" Mobility Outcome Measure (Version 2)  Help needed turning from your back to your side while in a flat bed without using bedrails? 3  Help needed moving from lying on your back to sitting on the side of a flat bed without using bedrails? 3  Help needed moving to and from a bed to a chair (including a wheelchair)? 2  Help needed standing up from a chair using your arms (e.g., wheelchair or bedside chair)? 2  Help needed to walk in hospital room? 1  Help needed climbing 3-5 steps with a railing?  1  6 Click Score 12  Consider Recommendation of Discharge To: CIR/SNF/LTACH  PT Recommendation  Follow Up Recommendations CIR  Individuals Consulted  Consulted and Agree with Results and Recommendations Patient;Family member/caregiver  Family Member Consulted daughter  Acute Rehab PT Goals  Patient Stated Goal to get it all figured out next week  PT Goal Formulation With patient/family  Time For Goal Achievement 07/08/18  Potential to Achieve Goals Fair  PT Time Calculation  PT Start Time (ACUTE ONLY) 1600  PT Stop Time (ACUTE ONLY) 1650  PT Time Calculation (min) (ACUTE ONLY) 50 min  PT General Charges  $$ ACUTE PT VISIT 1 Visit  PT Evaluation  $PT Eval Moderate Complexity 1 Mod   Zeva Leber B. Beverely Risen PT, DPT Acute Rehabilitation Services Pager [phone] Office [phone]

## 2018-06-24 NOTE — Progress Notes (Signed)
Patient ID: Craig [name], male   DOB: [birth year], 84 y.o.   MRN: [id number] Talk with the patient and his daughter. Patient doesn't have any complaints. However patient remains confused with a slight left pronator drift. He is oriented 3. CT scans do show bilateral subacute subdural hematomas. This will need bur hole evacuation. We will need to termination from cardiology risk of how long we can keep him off Brillinta with regard to his cardiac stents. I am currently planning on observing over the weekend and doing his bur holes on Monday.

## 2018-06-24 NOTE — Progress Notes (Signed)
Please see H&P from this morning.  Patient seen and examined.  Discussed with daughter bedside.  85 year old male with CAD and recent PTCA on dual antiplatelet agents who is generally very functional at baseline presents with falls x2.  Daughter states patient usually ambulatory and very active but after cardiac cath he was sent home on statins due to which she believes he has been having myalgias and trouble walking.  He fell on Sunday and again on Tuesday.  Now admitted with bilateral subacute subdural hematomas.  Patient is oriented x3 but has had intermittent episodes of disorientation per daughter.  Neurosurgery planning on bur hole evacuation.  Will consult cardiology regarding continued holding of dual antiplatelet agents/timing of surgery.  Family understands high risk of stent thrombosis/MI and risk of bleeding without and with antiplatelet agents.  Will hold statins.  CK 116 on presentation.

## 2018-06-25 ENCOUNTER — Encounter (HOSPITAL_COMMUNITY): Payer: Self-pay | Admitting: *Deleted

## 2018-06-25 NOTE — Progress Notes (Signed)
    Pt looks well No CP   Continue to hold ASA and Brilinta for neurosurgery on Monday     Kristeen Miss, MD  06/25/2018 11:42 AM    [facility] [address] [place],  [address] [place], [place]  [address] Pager [phone] Phone: [phone]; Fax: [phone]

## 2018-06-25 NOTE — Progress Notes (Signed)
PROGRESS NOTE    Craig [name]  NLG:[id number] DOB: 19-May-1934 DOA: 06/23/2018 PCP: Practice, [facility]  Outpatient Specialists:   Brief Narrative: As per H&P done by Dr. Nada Maclachlan on admission "Craig [name] is a 84 y.o. male with medical history significant of CAD had inferior wall STEMI on 05/30/2018, underwent PCI with DES to D1.  EF 45-50%.  Had actually been doing fairly well since that time.  Did have residual disease and ultimately was planned to do another PCI, this time to the 80% blockage of the RCA, this originally planned for today but was deferred as an elective procedure for 4 weeks due to the coronavirus pandemic.  He had been doing well until earlier this week.  Larey Seat out of bed on Sunday.  Then had fall on Tuesday landing on his left side and arm, unsure if he hit his head.  He has been on Brilinta and ASA following the STEMI and PCI in Feb.  Since those falls he has had progressive mental and functional decline for the past 2 days.  He was independent prior to this, now with slowed mental status, needing maximal assist with walking.   ED Course: CTH reveals large bilateral SDH"  06/25/2018: Neurosurgery and cardiology input is appreciated.  There is plan for possible neurosurgical procedure on Monday (2 days) for the intracerebral hemorrhage.  Aspirin and Brilinta are on hold.  According to the patient's daughter, patient was fairly active prior to the falls (patient was seen alongside patient's daughter).   Assessment & Plan:   Principal Problem:   Bilateral subdural hematomas (HCC) Active Problems:   Status post coronary artery stent placement  Bilateral subdural hematoma with some mass-effect: Repeat CT scan head result done on 06/24/2018 is noted. Neurosurgery: Cardiology input is highly appreciated. Aspirin and Brilinta is on hold. For possible neurosurgical procedure on Monday, 06/27/2018. Continue neurochecks every 2 hours.   Coronary artery disease: Stable. No chest pain. Aspirin and Brilinta on hold.  Hypertension: Continue to monitor and optimize.  Ischemic cardiomyopathy: -Echocardiogram done on 05/31/2018 revealed EF of 40 to 45% and pseudonormalization of the left ventricular diastolic Doppler parameters. -Stable.  Chronic kidney disease stage IIIa: Stable. Continue to monitor. Last serum creatinine was 1.16.  DVT prophylaxis: SCDs Code Status: Full code for now (family going to discuss more) Family Communication: Daughter at bedside, the high risk and difficult situation has been explained as above.  She understands risks. Disposition Plan:  This will depend on hospital course. Consults called: Neurosurgery and cardiology  Procedures:   Stable neurosurgical procedure in 2 days time.  Antimicrobials:   None   Subjective: No new complaints. Patient has difficulty ambulating/standing. No chest No headache No shortness of breath Fever chills.  Objective: Vitals:   06/25/18 0400 06/25/18 0749 06/25/18 1228 06/25/18 1555  BP: 130/82 130/82 (!) 151/87 (!) 151/87  Pulse: 64  89 71  Resp: 18  17 17   Temp: 97.9 F (36.6 C) 98.2 F (36.8 C) 98.1 F (36.7 C) 98.6 F (37 C)  TempSrc: Oral Oral Oral Oral  SpO2:   95%   Weight:      Height:        Intake/Output Summary (Last 24 hours) at 06/25/2018 1637 Last data filed at 06/25/2018 1300 Gross per 24 hour  Intake 480 ml  Output -  Net 480 ml   Filed Weights   06/23/18 1939 06/24/18 0218 06/24/18 0739  Weight: 77.1 kg 80.8 kg 81.6 kg  Examination:  General exam: Appears calm and comfortable. Respiratory system: Clear to auscultation. Cardiovascular system: S1 & S2 heard Gastrointestinal system: Abdomen is nondistended, soft and nontender. No organomegaly or masses felt. Normal bowel sounds heard. Central nervous system: Awake and alert.  Patient moves all limbs.   Extremities: No leg edema.  Data Reviewed: I have  personally reviewed following labs and imaging studies  CBC: Recent Labs  Lab 06/23/18 2016 06/24/18 0553  WBC 7.9 6.7  HGB 14.5 12.6*  HCT 42.5 37.2*  MCV 88.7 86.7  PLT 179 132*   Basic Metabolic Panel: Recent Labs  Lab 06/23/18 2016 06/24/18 0553  NA 137 138  K 3.7 3.6  CL 105 107  CO2 23 21*  GLUCOSE 181* 125*  BUN 17 18  CREATININE 1.24 1.16  CALCIUM 9.4 8.9   GFR: Estimated Creatinine Clearance: 50.7 mL/min (by C-G formula based on SCr of 1.16 mg/dL). Liver Function Tests: Recent Labs  Lab 06/23/18 2153  AST 26  ALT 17  ALKPHOS 92  BILITOT 1.5*  PROT 7.2  ALBUMIN 4.0   No results for input(s): LIPASE, AMYLASE in the last 168 hours. No results for input(s): AMMONIA in the last 168 hours. Coagulation Profile: Recent Labs  Lab 06/23/18 0100  INR 1.0   Cardiac Enzymes: Recent Labs  Lab 06/23/18 2153  CKTOTAL 116  TROPONINI <0.03   BNP (last 3 results) No results for input(s): PROBNP in the last 8760 hours. HbA1C: No results for input(s): HGBA1C in the last 72 hours. CBG: Recent Labs  Lab 06/23/18 2348  GLUCAP 95   Lipid Profile: No results for input(s): CHOL, HDL, LDLCALC, TRIG, CHOLHDL, LDLDIRECT in the last 72 hours. Thyroid Function Tests: No results for input(s): TSH, T4TOTAL, FREET4, T3FREE, THYROIDAB in the last 72 hours. Anemia Panel: No results for input(s): VITAMINB12, FOLATE, FERRITIN, TIBC, IRON, RETICCTPCT in the last 72 hours. Urine analysis:    Component Value Date/Time   COLORURINE YELLOW 06/24/2018 0011   APPEARANCEUR CLEAR 06/24/2018 0011   LABSPEC 1.023 06/24/2018 0011   PHURINE 5.0 06/24/2018 0011   GLUCOSEU NEGATIVE 06/24/2018 0011   HGBUR NEGATIVE 06/24/2018 0011   BILIRUBINUR NEGATIVE 06/24/2018 0011   KETONESUR NEGATIVE 06/24/2018 0011   PROTEINUR NEGATIVE 06/24/2018 0011   NITRITE NEGATIVE 06/24/2018 0011   LEUKOCYTESUR NEGATIVE 06/24/2018 0011   Sepsis Labs: (procalcitonin:4,lacticidven:4)   )No results found for this or any previous visit (from the past 240 hour(s)).       Radiology Studies: Ct Head Wo Contrast  Result Date: 06/24/2018 CLINICAL DATA:  84 year old male with large bilateral subdural hematomas discovered after head trauma on anticoagulation. EXAM: CT HEAD WITHOUT CONTRAST TECHNIQUE: Contiguous axial images were obtained from the base of the skull through the vertex without intravenous contrast. COMPARISON:  06/23/2018. FINDINGS: Brain: Mixed density bilateral superior convexity extra-axial hematomas are biconvex, but subdural hematoma is suspected. Layering hematocrit bilaterally. Right frontal convexity hematoma measures up to 25 millimeters in thickness. Left convexity hematoma measures up to 26 millimeters. Associated frontal and parietal mass effect. Trace rightward midline shift (coronal image 36). Basilar cisterns remain normal. No other intracranial hemorrhage identified. Gray-white matter differentiation is within normal limits throughout the brain. No cortically based acute infarct identified. No ventriculomegaly. Vascular: Calcified atherosclerosis at the skull base. Skull: No fracture identified. Sinuses/Orbits: Visualized paranasal sinuses and mastoids are stable and well pneumatized. Other: Negative orbits and scalp soft tissues. IMPRESSION: 1. Bilateral superior convexity mixed density biconvex extra-axial hemorrhage most compatible with subdural hematomas.  Thickness up to 25 mm on the right and 26 mm on the left. Mass-effect primarily on the superior frontal and parietal convexities with trace rightward midline shift. Preserved basilar cisterns.  No ventriculomegaly. 2. No skull fracture or other acute intracranial abnormality identified. Electronically Signed   By: Odessa Fleming M.D.   On: 06/24/2018 07:26   Ct Head Wo Contrast  Result Date: 06/23/2018 CLINICAL DATA:  Head trauma, on anticoagulation EXAM: CT HEAD WITHOUT CONTRAST CT CERVICAL SPINE WITHOUT CONTRAST  TECHNIQUE: Multidetector CT imaging of the head and cervical spine was performed following the standard protocol without intravenous contrast. Multiplanar CT image reconstructions of the cervical spine were also generated. COMPARISON:  None. FINDINGS: CT HEAD FINDINGS Brain: There are bilateral mixed density subdural hematomas. The right subdural hematoma measures 24 mm in thickness and is predominantly low-density with layering high-density material posteriorly. The left subdural hematoma measures approximately 27 mm in thickness and is isodense with hyperdense areas within it. Mass effect on the underlying cerebral hemispheres. No midline shift. No intracerebral hemorrhage or infarction. No hydrocephalus. Vascular: No hyperdense vessel or unexpected calcification. Skull: No acute calvarial abnormality. Sinuses/Orbits: Visualized paranasal sinuses and mastoids clear. Orbital soft tissues unremarkable. Other: None CT CERVICAL SPINE FINDINGS Alignment: No subluxation Skull base and vertebrae: No acute fracture. No primary bone lesion or focal pathologic process. Soft tissues and spinal canal: No prevertebral fluid or swelling. No visible canal hematoma. Disc levels:  Early anterior spurring. Upper chest: No acute findings Other: None IMPRESSION: Large bilateral subdural hematomas of differing ages. Mass-effect on the underlying cerebral hemispheres. No midline shift. No acute bony abnormality in the cervical spine. These results were called by telephone at the time of interpretation on 06/23/2018 at 10:32 pm to Dr. Loren Racer , who verbally acknowledged these results. Electronically Signed   By: Charlett Nose M.D.   On: 06/23/2018 22:34   Ct Cervical Spine Wo Contrast  Result Date: 06/23/2018 CLINICAL DATA:  Head trauma, on anticoagulation EXAM: CT HEAD WITHOUT CONTRAST CT CERVICAL SPINE WITHOUT CONTRAST TECHNIQUE: Multidetector CT imaging of the head and cervical spine was performed following the standard  protocol without intravenous contrast. Multiplanar CT image reconstructions of the cervical spine were also generated. COMPARISON:  None. FINDINGS: CT HEAD FINDINGS Brain: There are bilateral mixed density subdural hematomas. The right subdural hematoma measures 24 mm in thickness and is predominantly low-density with layering high-density material posteriorly. The left subdural hematoma measures approximately 27 mm in thickness and is isodense with hyperdense areas within it. Mass effect on the underlying cerebral hemispheres. No midline shift. No intracerebral hemorrhage or infarction. No hydrocephalus. Vascular: No hyperdense vessel or unexpected calcification. Skull: No acute calvarial abnormality. Sinuses/Orbits: Visualized paranasal sinuses and mastoids clear. Orbital soft tissues unremarkable. Other: None CT CERVICAL SPINE FINDINGS Alignment: No subluxation Skull base and vertebrae: No acute fracture. No primary bone lesion or focal pathologic process. Soft tissues and spinal canal: No prevertebral fluid or swelling. No visible canal hematoma. Disc levels:  Early anterior spurring. Upper chest: No acute findings Other: None IMPRESSION: Large bilateral subdural hematomas of differing ages. Mass-effect on the underlying cerebral hemispheres. No midline shift. No acute bony abnormality in the cervical spine. These results were called by telephone at the time of interpretation on 06/23/2018 at 10:32 pm to Dr. Loren Racer , who verbally acknowledged these results. Electronically Signed   By: Charlett Nose M.D.   On: 06/23/2018 22:34   Dg Humerus Left  Result Date: 06/23/2018 CLINICAL DATA:  Larey Seat two days ago. Bruising over the forearm and shoulder. EXAM: LEFT HUMERUS - 2+ VIEW COMPARISON:  None. FINDINGS: There is no evidence of fracture or other focal bone lesions. Soft tissues are unremarkable. IMPRESSION: Negative. Electronically Signed   By: Amie Portland M.D.   On: 06/23/2018 22:28         Scheduled Meds: . sodium chloride flush  3 mL Intravenous Once   Continuous Infusions:   LOS: 1 day    Time spent: [id number]    Berton Mount, MD  [facility] Pager #: [phone] 7PM-7AM contact night coverage as above

## 2018-06-25 NOTE — Progress Notes (Signed)
Patient ID: Craig [name], male   DOB: May 29, 1934, 84 y.o.   MRN: [id number] Vital signs are stable Patient is modestly confused He is off anticoagulants at the current time and plan is for surgery on Monday Patient is pleasant and answers questions appropriately I discussed the plan with both him and his daughter who is present at the bedside Noted that at a minimum I would expect his length of stay to be at least 1 more week

## 2018-06-25 NOTE — Plan of Care (Signed)
  Problem: Education: Goal: Knowledge of disease or condition will improve Outcome: Progressing Goal: Knowledge of secondary prevention will improve Outcome: Progressing   

## 2018-06-26 NOTE — Anesthesia Preprocedure Evaluation (Addendum)
Anesthesia Evaluation  Patient identified by MRN, date of birth, ID band Patient awake and Patient confused    Reviewed: Allergy & Precautions, NPO status , Patient's Chart, lab work & pertinent test results  History of Anesthesia Complications Negative for: history of anesthetic complications  Airway Mallampati: II  TM Distance: >3 FB Neck ROM: Full    Dental  (+) Edentulous Upper, Edentulous Lower   Pulmonary former smoker,    breath sounds clear to auscultation       Cardiovascular hypertension, (-) angina+ CAD, + Past MI (05/30/2018) and + Cardiac Stents   Rhythm:Regular Rate:Normal  05/31/2018 ECHO: EF 45-50%, anterolat, inferolat hypokinesis, valves OK   Neuro/Psych Fall x2 on Brilinta: bilateral large SDH    GI/Hepatic negative GI ROS, Neg liver ROS,   Endo/Other  negative endocrine ROS  Renal/GU Renal InsufficiencyRenal disease     Musculoskeletal   Abdominal   Peds  Hematology brilinta plt 132k   Anesthesia Other Findings   Reproductive/Obstetrics                           Anesthesia Physical Anesthesia Plan  ASA: III  Anesthesia Plan: General   Post-op Pain Management:    Induction: Intravenous  PONV Risk Score and Plan: 2 and Treatment may vary due to age or medical condition, Ondansetron and Dexamethasone  Airway Management Planned: Oral ETT  Additional Equipment: Arterial line  Intra-op Plan:   Post-operative Plan: Extubation in OR  Informed Consent: I have reviewed the patients History and Physical, chart, labs and discussed the procedure including the risks, benefits and alternatives for the proposed anesthesia with the patient or authorized representative who has indicated his/her understanding and acceptance.     Consent reviewed with POA  Plan Discussed with: CRNA and Surgeon  Anesthesia Plan Comments: (Plan routine monitors, A line, GETA)        Anesthesia Quick Evaluation

## 2018-06-26 NOTE — Progress Notes (Signed)
Patient ID: Craig [name], male   DOB: 09-18-1934, 84 y.o.   MRN: [id number] Vital signs are stable Motor function remains about the same Plan for bur holes tomorrow Daughter in the room discussed surgery and answered questions Patient will be in ICU postop with likely dual drains.

## 2018-06-26 NOTE — Progress Notes (Signed)
PROGRESS NOTE    Craig [name]  NUU:[id number] DOB: 06/30/1934 DOA: 06/23/2018 PCP: Practice, [facility]  Outpatient Specialists:   Brief Narrative: As per H&P done by Dr. Nada Maclachlan on admission "Craig [name] is a 84 y.o. male with medical history significant of CAD had inferior wall STEMI on 05/30/2018, underwent PCI with DES to D1.  EF 45-50%.  Had actually been doing fairly well since that time.  Did have residual disease and ultimately was planned to do another PCI, this time to the 80% blockage of the RCA, this originally planned for today but was deferred as an elective procedure for 4 weeks due to the coronavirus pandemic.  He had been doing well until earlier this week.  Larey Seat out of bed on Sunday.  Then had fall on Tuesday landing on his left side and arm, unsure if he hit his head.  He has been on Brilinta and ASA following the STEMI and PCI in Feb.  Since those falls he has had progressive mental and functional decline for the past 2 days.  He was independent prior to this, now with slowed mental status, needing maximal assist with walking.   ED Course: CTH reveals large bilateral SDH"  06/26/2018: Neurosurgery and cardiology input is appreciated.  The plan is for burr hole in the morning as per neurosurgery team.   Assessment & Plan:   Principal Problem:   Bilateral subdural hematomas (HCC) Active Problems:   Status post coronary artery stent placement  Bilateral subdural hematoma with some mass-effect: Repeat CT scan head result done on 06/24/2018 is noted. Neurosurgery: Cardiology input is highly appreciated. Aspirin and Brilinta is on hold. For possible neurosurgical procedure (bur hole) on Monday, 06/27/2018. Continue neurochecks every 2 hours.  Coronary artery disease: Stable. No chest pain. Aspirin and Brilinta on hold.  Hypertension: Continue to monitor and optimize.  Ischemic cardiomyopathy: -Echocardiogram done on 05/31/2018  revealed EF of 40 to 45% and pseudonormalization of the left ventricular diastolic Doppler parameters. -Stable.  Chronic kidney disease stage IIIa: Stable. Continue to monitor. Last serum creatinine was 1.16.  DVT prophylaxis: SCDs Code Status: Full code for now (family going to discuss more) Family Communication: Daughter at bedside, the high risk and difficult situation has been explained as above.  She understands risks. Disposition Plan:  This will depend on hospital course. Consults called: Neurosurgery and cardiology  Procedures:   Stable neurosurgical procedure in 2 days time.  Antimicrobials:   None   Subjective: No new complaints. Patient has difficulty ambulating/standing. No chest No shortness of breath.  Objective: Vitals:   06/26/18 0805 06/26/18 1200 06/26/18 1320 06/26/18 1621  BP: 133/80 140/64  133/71  Pulse: 70 73 71 94  Resp: 20 16 20 20   Temp: 97.7 F (36.5 C) 97.6 F (36.4 C) 98.1 F (36.7 C) 98.4 F (36.9 C)  TempSrc: Oral Axillary Oral Oral  SpO2: 99% 94% 95% 92%  Weight:      Height:        Intake/Output Summary (Last 24 hours) at 06/26/2018 1709 Last data filed at 06/26/2018 0900 Gross per 24 hour  Intake 360 ml  Output 1150 ml  Net -790 ml   Filed Weights   06/23/18 1939 06/24/18 0218 06/24/18 0739  Weight: 77.1 kg 80.8 kg 81.6 kg    Examination:  General exam: Appears calm and comfortable. Respiratory system: Clear to auscultation. Cardiovascular system: S1 & S2 heard Gastrointestinal system: Abdomen is nondistended, soft and nontender. No organomegaly or  masses felt. Normal bowel sounds heard. Central nervous system: Awake and alert.  Patient moves all limbs.   Extremities: No leg edema.  Data Reviewed: I have personally reviewed following labs and imaging studies  CBC: Recent Labs  Lab 06/23/18 2016 06/24/18 0553  WBC 7.9 6.7  HGB 14.5 12.6*  HCT 42.5 37.2*  MCV 88.7 86.7  PLT 179 132*   Basic Metabolic Panel:  Recent Labs  Lab 06/23/18 2016 06/24/18 0553  NA 137 138  K 3.7 3.6  CL 105 107  CO2 23 21*  GLUCOSE 181* 125*  BUN 17 18  CREATININE 1.24 1.16  CALCIUM 9.4 8.9   GFR: Estimated Creatinine Clearance: 50.7 mL/min (by C-G formula based on SCr of 1.16 mg/dL). Liver Function Tests: Recent Labs  Lab 06/23/18 2153  AST 26  ALT 17  ALKPHOS 92  BILITOT 1.5*  PROT 7.2  ALBUMIN 4.0   No results for input(s): LIPASE, AMYLASE in the last 168 hours. No results for input(s): AMMONIA in the last 168 hours. Coagulation Profile: Recent Labs  Lab 06/23/18 0100  INR 1.0   Cardiac Enzymes: Recent Labs  Lab 06/23/18 2153  CKTOTAL 116  TROPONINI <0.03   BNP (last 3 results) No results for input(s): PROBNP in the last 8760 hours. HbA1C: No results for input(s): HGBA1C in the last 72 hours. CBG: Recent Labs  Lab 06/23/18 2348  GLUCAP 95   Lipid Profile: No results for input(s): CHOL, HDL, LDLCALC, TRIG, CHOLHDL, LDLDIRECT in the last 72 hours. Thyroid Function Tests: No results for input(s): TSH, T4TOTAL, FREET4, T3FREE, THYROIDAB in the last 72 hours. Anemia Panel: No results for input(s): VITAMINB12, FOLATE, FERRITIN, TIBC, IRON, RETICCTPCT in the last 72 hours. Urine analysis:    Component Value Date/Time   COLORURINE YELLOW 06/24/2018 0011   APPEARANCEUR CLEAR 06/24/2018 0011   LABSPEC 1.023 06/24/2018 0011   PHURINE 5.0 06/24/2018 0011   GLUCOSEU NEGATIVE 06/24/2018 0011   HGBUR NEGATIVE 06/24/2018 0011   BILIRUBINUR NEGATIVE 06/24/2018 0011   KETONESUR NEGATIVE 06/24/2018 0011   PROTEINUR NEGATIVE 06/24/2018 0011   NITRITE NEGATIVE 06/24/2018 0011   LEUKOCYTESUR NEGATIVE 06/24/2018 0011   Sepsis Labs: @LABRCNTIP (procalcitonin:4,lacticidven:4)  )No results found for this or any previous visit (from the past 240 hour(s)).       Radiology Studies: No results found.      Scheduled Meds: . sodium chloride flush  3 mL Intravenous Once   Continuous  Infusions:   LOS: 2 days    Time spent: 16109    Berton Mount, MD  [facility] Pager #: [phone] 7PM-7AM contact night coverage as above

## 2018-06-27 ENCOUNTER — Inpatient Hospital Stay (HOSPITAL_COMMUNITY): Payer: Medicare Other | Admitting: Anesthesiology

## 2018-06-27 ENCOUNTER — Telehealth: Payer: Self-pay | Admitting: Interventional Cardiology

## 2018-06-27 ENCOUNTER — Encounter (HOSPITAL_COMMUNITY)
Admission: EM | Disposition: A | Discharge status: Rehabilitation Facility | Payer: Self-pay | Source: Home / Self Care | Attending: Internal Medicine

## 2018-06-27 ENCOUNTER — Encounter (HOSPITAL_COMMUNITY): Payer: Self-pay | Admitting: Anesthesiology

## 2018-06-27 DIAGNOSIS — S065X9A Traumatic subdural hemorrhage with loss of consciousness of unspecified duration, initial encounter: Secondary | ICD-10-CM | POA: Diagnosis present

## 2018-06-27 DIAGNOSIS — S065XAA Traumatic subdural hemorrhage with loss of consciousness status unknown, initial encounter: Secondary | ICD-10-CM | POA: Diagnosis present

## 2018-06-27 HISTORY — PX: BURR HOLE: SHX908

## 2018-06-27 LAB — RENAL FUNCTION PANEL
Albumin: 3.8 g/dL (ref 3.5–5.0)
Anion gap: 11 (ref 5–15)
BUN: 20 mg/dL (ref 8–23)
CO2: 20 mmol/L — ABNORMAL LOW (ref 22–32)
Calcium: 9.2 mg/dL (ref 8.9–10.3)
Chloride: 104 mmol/L (ref 98–111)
Creatinine, Ser: 1.08 mg/dL (ref 0.61–1.24)
GFR calc Af Amer: >60 mL/min (ref >60)
GFR calc non Af Amer: >60 mL/min (ref >60)
Glucose, Bld: 128 mg/dL — ABNORMAL HIGH (ref 70–99)
Phosphorus: 3.3 mg/dL (ref 2.5–4.6)
Potassium: 4.2 mmol/L (ref 3.5–5.1)
Sodium: 135 mmol/L (ref 135–145)

## 2018-06-27 LAB — CBC WITH DIFFERENTIAL/PLATELET
Abs Immature Granulocytes: 0.02 10*3/uL (ref 0.00–0.07)
Basophils Absolute: 0 10*3/uL (ref 0.0–0.1)
Basophils Relative: 1 %
Eosinophils Absolute: 0.5 10*3/uL (ref 0.0–0.5)
Eosinophils Relative: 6 %
HCT: 42.9 % (ref 39.0–52.0)
Hemoglobin: 15.1 g/dL (ref 13.0–17.0)
Immature Granulocytes: 0 %
Lymphocytes Relative: 24 %
Lymphs Abs: 1.7 10*3/uL (ref 0.7–4.0)
MCH: 30.4 pg (ref 26.0–34.0)
MCHC: 35.2 g/dL (ref 30.0–36.0)
MCV: 86.3 fL (ref 80.0–100.0)
Monocytes Absolute: 0.5 10*3/uL (ref 0.1–1.0)
Monocytes Relative: 7 %
Neutro Abs: 4.6 10*3/uL (ref 1.7–7.7)
Neutrophils Relative %: 62 %
Platelets: 169 10*3/uL (ref 150–400)
RBC: 4.97 MIL/uL (ref 4.22–5.81)
RDW: 12.2 % (ref 11.5–15.5)
WBC: 7.4 10*3/uL (ref 4.0–10.5)
nRBC: 0 % (ref 0.0–0.2)

## 2018-06-27 LAB — TYPE AND SCREEN
ABO/RH(D): A POS
Antibody Screen: NEGATIVE

## 2018-06-27 LAB — CBC
HEMATOCRIT: 43.5 % (ref 39.0–52.0)
HEMOGLOBIN: 14.6 g/dL (ref 13.0–17.0)
MCH: 29.1 pg (ref 26.0–34.0)
MCHC: 33.6 g/dL (ref 30.0–36.0)
MCV: 86.8 fL (ref 80.0–100.0)
Platelets: 161 10*3/uL (ref 150–400)
RBC: 5.01 MIL/uL (ref 4.22–5.81)
RDW: 12.1 % (ref 11.5–15.5)
WBC: 9.3 10*3/uL (ref 4.0–10.5)
nRBC: 0 % (ref 0.0–0.2)

## 2018-06-27 LAB — MRSA PCR SCREENING: MRSA by PCR: NEGATIVE

## 2018-06-27 LAB — BASIC METABOLIC PANEL
Anion gap: 13 (ref 5–15)
BUN: 22 mg/dL (ref 8–23)
CO2: 22 mmol/L (ref 22–32)
Calcium: 9.3 mg/dL (ref 8.9–10.3)
Chloride: 103 mmol/L (ref 98–111)
Creatinine, Ser: 1.26 mg/dL — ABNORMAL HIGH (ref 0.61–1.24)
GFR calc Af Amer: >60 mL/min (ref >60)
GFR calc non Af Amer: 53 mL/min — ABNORMAL LOW (ref >60)
Glucose, Bld: 179 mg/dL — ABNORMAL HIGH (ref 70–99)
POTASSIUM: 4.2 mmol/L (ref 3.5–5.1)
Sodium: 138 mmol/L (ref 135–145)

## 2018-06-27 LAB — ABO/RH: ABO/RH(D): A POS

## 2018-06-27 LAB — MAGNESIUM: Magnesium: 2.1 mg/dL (ref 1.7–2.4)

## 2018-06-27 SURGERY — CREATION, CRANIAL BURR HOLE
Anesthesia: General

## 2018-06-27 MED ORDER — FENTANYL CITRATE (PF) 100 MCG/2ML IJ SOLN
INTRAMUSCULAR | Status: DC | PRN
  Administered 2018-06-27: 50 ug via INTRAVENOUS

## 2018-06-27 MED ORDER — CEFAZOLIN SODIUM-DEXTROSE 2-4 GM/100ML-% IV SOLN
2.0000 g | Freq: Once | INTRAVENOUS | Status: AC
  Administered 2018-06-27: 2 g via INTRAVENOUS

## 2018-06-27 MED ORDER — THROMBIN 5000 UNITS EX SOLR
OROMUCOSAL | Status: DC | PRN
  Administered 2018-06-27: 09:00:00 via TOPICAL

## 2018-06-27 MED ORDER — DEXAMETHASONE SODIUM PHOSPHATE 10 MG/ML IJ SOLN
INTRAMUSCULAR | Status: DC | PRN
  Administered 2018-06-27: 10 mg via INTRAVENOUS

## 2018-06-27 MED ORDER — CEFAZOLIN SODIUM-DEXTROSE 2-4 GM/100ML-% IV SOLN
2.0000 g | Freq: Three times a day (TID) | INTRAVENOUS | Status: AC
  Administered 2018-06-27 – 2018-06-28 (×2): 2 g via INTRAVENOUS
  Filled 2018-06-27 (×2): qty 100

## 2018-06-27 MED ORDER — PHENYLEPHRINE 40 MCG/ML (10ML) SYRINGE FOR IV PUSH (FOR BLOOD PRESSURE SUPPORT)
PREFILLED_SYRINGE | INTRAVENOUS | Status: DC | PRN
  Administered 2018-06-27 (×2): 120 ug via INTRAVENOUS

## 2018-06-27 MED ORDER — ROCURONIUM BROMIDE 10 MG/ML (PF) SYRINGE
PREFILLED_SYRINGE | INTRAVENOUS | Status: DC | PRN
  Administered 2018-06-27: 50 mg via INTRAVENOUS

## 2018-06-27 MED ORDER — CEFAZOLIN SODIUM-DEXTROSE 2-4 GM/100ML-% IV SOLN
INTRAVENOUS | Status: AC
  Filled 2018-06-27: qty 100

## 2018-06-27 MED ORDER — ONDANSETRON HCL 4 MG/2ML IJ SOLN: 4.0000 mg | INTRAMUSCULAR | Status: DC | PRN

## 2018-06-27 MED ORDER — LEVETIRACETAM IN NACL 500 MG/100ML IV SOLN
500.0000 mg | Freq: Two times a day (BID) | INTRAVENOUS | Status: DC
  Administered 2018-06-27 – 2018-06-30 (×7): 500 mg via INTRAVENOUS
  Filled 2018-06-27 (×7): qty 100

## 2018-06-27 MED ORDER — PROMETHAZINE HCL 25 MG PO TABS: 12.5000 mg | ORAL_TABLET | ORAL | Status: DC | PRN

## 2018-06-27 MED ORDER — THROMBIN 5000 UNITS EX SOLR
CUTANEOUS | Status: AC
  Filled 2018-06-27: qty 10000

## 2018-06-27 MED ORDER — PANTOPRAZOLE SODIUM 40 MG IV SOLR
40.0000 mg | Freq: Every day | INTRAVENOUS | Status: DC
  Administered 2018-06-27 – 2018-06-28 (×2): 40 mg via INTRAVENOUS
  Filled 2018-06-27 (×2): qty 40

## 2018-06-27 MED ORDER — DOCUSATE SODIUM 100 MG PO CAPS
100.0000 mg | ORAL_CAPSULE | Freq: Two times a day (BID) | ORAL | Status: DC
  Administered 2018-06-27 – 2018-06-30 (×6): 100 mg via ORAL
  Filled 2018-06-27 (×6): qty 1

## 2018-06-27 MED ORDER — BACITRACIN ZINC 500 UNIT/GM EX OINT
TOPICAL_OINTMENT | CUTANEOUS | Status: AC
  Filled 2018-06-27: qty 28.35

## 2018-06-27 MED ORDER — FENTANYL CITRATE (PF) 250 MCG/5ML IJ SOLN
INTRAMUSCULAR | Status: AC
  Filled 2018-06-27: qty 5

## 2018-06-27 MED ORDER — SODIUM CHLORIDE 0.9 % IV SOLN
INTRAVENOUS | Status: DC | PRN
  Administered 2018-06-27: 07:00:00 via INTRAVENOUS

## 2018-06-27 MED ORDER — LIDOCAINE-EPINEPHRINE 1 %-1:100000 IJ SOLN
INTRAMUSCULAR | Status: AC
  Filled 2018-06-27: qty 1

## 2018-06-27 MED ORDER — 0.9 % SODIUM CHLORIDE (POUR BTL) OPTIME
TOPICAL | Status: DC | PRN
  Administered 2018-06-27 (×3): 1000 mL

## 2018-06-27 MED ORDER — ONDANSETRON HCL 4 MG/2ML IJ SOLN
INTRAMUSCULAR | Status: AC
  Filled 2018-06-27: qty 2

## 2018-06-27 MED ORDER — HEMOSTATIC AGENTS (NO CHARGE) OPTIME
TOPICAL | Status: DC | PRN
  Administered 2018-06-27: 1 via TOPICAL

## 2018-06-27 MED ORDER — LIDOCAINE-EPINEPHRINE 1 %-1:100000 IJ SOLN
INTRAMUSCULAR | Status: DC | PRN
  Administered 2018-06-27: 5 mL

## 2018-06-27 MED ORDER — THROMBIN 5000 UNITS EX SOLR
CUTANEOUS | Status: AC
  Filled 2018-06-27: qty 5000

## 2018-06-27 MED ORDER — ONDANSETRON HCL 4 MG PO TABS: 4.0000 mg | ORAL_TABLET | ORAL | Status: DC | PRN

## 2018-06-27 MED ORDER — LIDOCAINE 2% (20 MG/ML) 5 ML SYRINGE
INTRAMUSCULAR | Status: AC
  Filled 2018-06-27: qty 5

## 2018-06-27 MED ORDER — POTASSIUM CHLORIDE IN NACL 20-0.9 MEQ/L-% IV SOLN
INTRAVENOUS | Status: DC
  Administered 2018-06-27 – 2018-06-29 (×4): via INTRAVENOUS
  Filled 2018-06-27 (×4): qty 1000

## 2018-06-27 MED ORDER — THROMBIN 5000 UNITS EX SOLR
CUTANEOUS | Status: DC | PRN
  Administered 2018-06-27 (×2): 5000 [IU] via TOPICAL

## 2018-06-27 MED ORDER — LABETALOL HCL 5 MG/ML IV SOLN: 10.0000 mg | INTRAVENOUS | Status: DC | PRN

## 2018-06-27 MED ORDER — PROPOFOL 10 MG/ML IV BOLUS
INTRAVENOUS | Status: AC
  Filled 2018-06-27: qty 20

## 2018-06-27 MED ORDER — PROPOFOL 10 MG/ML IV BOLUS
INTRAVENOUS | Status: DC | PRN
  Administered 2018-06-27: 60 mg via INTRAVENOUS
  Administered 2018-06-27: 30 mg via INTRAVENOUS

## 2018-06-27 MED ORDER — SODIUM CHLORIDE 0.9 % IV SOLN
INTRAVENOUS | Status: DC | PRN
  Administered 2018-06-27: 50 ug/min via INTRAVENOUS

## 2018-06-27 MED ORDER — LABETALOL HCL 5 MG/ML IV SOLN
INTRAVENOUS | Status: DC | PRN
  Administered 2018-06-27 (×3): 5 mg via INTRAVENOUS

## 2018-06-27 MED ORDER — LACTATED RINGERS IV SOLN
INTRAVENOUS | Status: DC | PRN
  Administered 2018-06-27: 07:00:00 via INTRAVENOUS

## 2018-06-27 MED ORDER — SUGAMMADEX SODIUM 200 MG/2ML IV SOLN
INTRAVENOUS | Status: DC | PRN
  Administered 2018-06-27: 175 mg via INTRAVENOUS

## 2018-06-27 MED ORDER — SODIUM CHLORIDE 0.9 % IV SOLN
INTRAVENOUS | Status: DC | PRN
  Administered 2018-06-27: 07:00:00

## 2018-06-27 MED ORDER — BACITRACIN ZINC 500 UNIT/GM EX OINT
TOPICAL_OINTMENT | CUTANEOUS | Status: DC | PRN
  Administered 2018-06-27: 1 via TOPICAL

## 2018-06-27 MED ORDER — DEXAMETHASONE SODIUM PHOSPHATE 10 MG/ML IJ SOLN
INTRAMUSCULAR | Status: AC
  Filled 2018-06-27: qty 1

## 2018-06-27 MED ORDER — ONDANSETRON HCL 4 MG/2ML IJ SOLN
INTRAMUSCULAR | Status: DC | PRN
  Administered 2018-06-27: 4 mg via INTRAVENOUS

## 2018-06-27 SURGICAL SUPPLY — 67 items
BAG DECANTER FOR FLEXI CONT (MISCELLANEOUS) ×3 IMPLANT
BLADE CLIPPER SURG (BLADE) IMPLANT
BLADE SURG 11 STRL SS (BLADE) ×3 IMPLANT
BNDG COHESIVE 4X5 TAN NS LF (GAUZE/BANDAGES/DRESSINGS) IMPLANT
BNDG GAUZE ELAST 4 BULKY (GAUZE/BANDAGES/DRESSINGS) IMPLANT
BUR ACORN 9.0 PRECISION (BURR) ×2 IMPLANT
BUR ACORN 9.0MM PRECISION (BURR) ×1
CANISTER SUCT 3000ML PPV (MISCELLANEOUS) ×3 IMPLANT
CARTRIDGE OIL MAESTRO DRILL (MISCELLANEOUS) ×1 IMPLANT
CATH ROBINSON RED A/P 12FR (CATHETERS) ×2 IMPLANT
CATH VENTRIC 35X38 W/TROCAR LG (CATHETERS) ×2 IMPLANT
CLIP VESOCCLUDE MED 6/CT (CLIP) IMPLANT
COVER WAND RF STERILE (DRAPES) ×3 IMPLANT
DECANTER SPIKE VIAL GLASS SM (MISCELLANEOUS) ×3 IMPLANT
DERMABOND ADVANCED (GAUZE/BANDAGES/DRESSINGS) ×2
DERMABOND ADVANCED .7 DNX12 (GAUZE/BANDAGES/DRESSINGS) ×1 IMPLANT
DIFFUSER DRILL AIR PNEUMATIC (MISCELLANEOUS) ×3 IMPLANT
DRAPE NEUROLOGICAL W/INCISE (DRAPES) IMPLANT
DRAPE SURG 17X23 STRL (DRAPES) IMPLANT
DRAPE WARM FLUID 44X44 (DRAPE) ×3 IMPLANT
DRSG OPSITE POSTOP 3X4 (GAUZE/BANDAGES/DRESSINGS) ×2 IMPLANT
DRSG OPSITE POSTOP 4X6 (GAUZE/BANDAGES/DRESSINGS) ×2 IMPLANT
ELECT REM PT RETURN 9FT ADLT (ELECTROSURGICAL) ×3
ELECTRODE REM PT RTRN 9FT ADLT (ELECTROSURGICAL) ×1 IMPLANT
GAUZE 4X4 16PLY RFD (DISPOSABLE) IMPLANT
GAUZE SPONGE 4X4 12PLY STRL (GAUZE/BANDAGES/DRESSINGS) IMPLANT
GLOVE BIO SURGEON STRL SZ7 (GLOVE) IMPLANT
GLOVE BIO SURGEON STRL SZ8 (GLOVE) ×3 IMPLANT
GLOVE BIOGEL PI IND STRL 7.0 (GLOVE) IMPLANT
GLOVE BIOGEL PI INDICATOR 7.0 (GLOVE)
GLOVE EXAM NITRILE XL STR (GLOVE) IMPLANT
GLOVE INDICATOR 8.5 STRL (GLOVE) ×3 IMPLANT
GOWN STRL REUS W/ TWL LRG LVL3 (GOWN DISPOSABLE) ×1 IMPLANT
GOWN STRL REUS W/ TWL XL LVL3 (GOWN DISPOSABLE) ×1 IMPLANT
GOWN STRL REUS W/TWL 2XL LVL3 (GOWN DISPOSABLE) ×3 IMPLANT
GOWN STRL REUS W/TWL LRG LVL3 (GOWN DISPOSABLE) ×2
GOWN STRL REUS W/TWL XL LVL3 (GOWN DISPOSABLE) ×2
HEMOSTAT POWDER SURGIFOAM 1G (HEMOSTASIS) ×2 IMPLANT
HEMOSTAT SURGICEL 2X14 (HEMOSTASIS) ×2 IMPLANT
HOOK DURA (MISCELLANEOUS) ×3 IMPLANT
KIT BASIN OR (CUSTOM PROCEDURE TRAY) ×3 IMPLANT
KIT TURNOVER KIT B (KITS) ×3 IMPLANT
NDL HYPO 25X1 1.5 SAFETY (NEEDLE) ×1 IMPLANT
NEEDLE HYPO 25X1 1.5 SAFETY (NEEDLE) ×3 IMPLANT
NS IRRIG 1000ML POUR BTL (IV SOLUTION) ×7 IMPLANT
OIL CARTRIDGE MAESTRO DRILL (MISCELLANEOUS) ×3
PACK CRANIOTOMY CUSTOM (CUSTOM PROCEDURE TRAY) ×3 IMPLANT
PAD ARMBOARD 7.5X6 YLW CONV (MISCELLANEOUS) ×9 IMPLANT
PATTIES SURGICAL .25X.25 (GAUZE/BANDAGES/DRESSINGS) IMPLANT
PATTIES SURGICAL .5 X.5 (GAUZE/BANDAGES/DRESSINGS) IMPLANT
PATTIES SURGICAL .5 X3 (DISPOSABLE) IMPLANT
PATTIES SURGICAL 1X1 (DISPOSABLE) IMPLANT
PIN MAYFIELD SKULL DISP (PIN) IMPLANT
SPONGE NEURO XRAY DETECT 1X3 (DISPOSABLE) IMPLANT
SPONGE SURGIFOAM ABS GEL 100 (HEMOSTASIS) IMPLANT
SPONGE SURGIFOAM ABS GEL SZ50 (HEMOSTASIS) ×2 IMPLANT
STAPLER VISISTAT 35W (STAPLE) ×3 IMPLANT
SUT NURALON 4 0 TR CR/8 (SUTURE) ×6 IMPLANT
SUT VIC AB 2-0 CT1 18 (SUTURE) ×5 IMPLANT
SYS ACCUDRAIN CSF W/REFLUX VAL (SYSTAGENIX WOUND MANAGEMENT) ×3
SYSTEM ACCDRN CSF W/REFLUX VAL (SYSTAGENIX WOUND MANAGEMENT) IMPLANT
TOWEL GREEN STERILE (TOWEL DISPOSABLE) ×3 IMPLANT
TOWEL GREEN STERILE FF (TOWEL DISPOSABLE) ×3 IMPLANT
TRAY FOLEY MTR SLVR 16FR STAT (SET/KITS/TRAYS/PACK) IMPLANT
TUBE CONNECTING 20'X1/4 (TUBING) ×1
TUBE CONNECTING 20X1/4 (TUBING) ×1 IMPLANT
WATER STERILE IRR 1000ML POUR (IV SOLUTION) ×3 IMPLANT

## 2018-06-27 NOTE — Anesthesia Procedure Notes (Signed)
Arterial Line Insertion Start/End3/23/2020 7:05 AM, 06/27/2018 7:34 AM Performed by: Jairo Ben, MD, anesthesiologist  Patient location: Pre-op. Preanesthetic checklist: patient identified, IV checked, risks and benefits discussed, surgical consent, monitors and equipment checked, pre-op evaluation, timeout performed and anesthesia consent Lidocaine 1% used for infiltration Left, radial was placed Catheter size: 20 G Hand hygiene performed , maximum sterile barriers used  and Seldinger technique used  Attempts: 4 (unsuccessful on right) Procedure performed using ultrasound guided technique. Ultrasound Notes:anatomy identified, needle tip was noted to be adjacent to the nerve/plexus identified and no ultrasound evidence of intravascular and/or intraneural injection Following insertion, dressing applied and Biopatch. Patient tolerated the procedure well with no immediate complications.

## 2018-06-27 NOTE — Anesthesia Procedure Notes (Signed)
Procedure Name: Intubation Date/Time: 06/27/2018 8:29 AM Performed by: Lovie Chol, CRNA Pre-anesthesia Checklist: Patient identified, Emergency Drugs available, Suction available and Patient being monitored Patient Re-evaluated:Patient Re-evaluated prior to induction Oxygen Delivery Method: Circle System Utilized Preoxygenation: Pre-oxygenation with 100% oxygen Induction Type: IV induction Ventilation: Mask ventilation without difficulty Laryngoscope Size: Miller and 3 Grade View: Grade I Tube type: Oral Tube size: 7.5 mm Number of attempts: 1 Airway Equipment and Method: Stylet and Oral airway Placement Confirmation: ETT inserted through vocal cords under direct vision,  positive ETCO2 and breath sounds checked- equal and bilateral Secured at: 22 cm Tube secured with: Tape Dental Injury: Teeth and Oropharynx as per pre-operative assessment

## 2018-06-27 NOTE — Progress Notes (Signed)
[place] TEAM 1 - Stepdown/ICU TEAM  STEVESON BOLING  HWT:[id number] DOB: November 21, 1934 DOA: 06/23/2018 PCP: [facility]    Brief Narrative:  84yo on Brilinta and ASA w/ a hx of CAD s/p inferior wall STEMI on 05/30/2018 > PCI with DES to D1 w/ residual disease and plan for eventual PCI to an 80% RCA lesion (delayed due to current pandemic), who fell out of bed. Two days later he fell again, landing on his left side and arm. Following these falls he was noted to exhibit progressive mental and functional decline. In the ED a CT head noted large B SDHs.   Significant Events: 3/20 admit 3/23 OR > B bur hole craniectomy for evacuation of B subacute subdural hematomas  Subjective: Pt was combative and confused upon returning to ICU post OR. At time of my visit he is more calm, alert, and appears comfortable.   Assessment & Plan:  Bilateral subdural hematomas with mass-effect Now s/p evacuation - post-op care per NS   Coronary artery disease s/p recent PCI > DES To resume ASA alone initially, then when safe substitute Plavix for Brilinta  Hypertension BP stable at this time   Ischemic cardiomyopathy TTE 05/31/2018 noted EF of 40-45%  Chronic kidney disease stage III Follow renal fxn in serial fashion   DVT prophylaxis: SCDs Code Status: FULL CODE Family Communication: no family present at time of exam  Disposition Plan:   Consultants:  NS Cardiology   Antimicrobials:  none  Objective: Blood pressure 112/64, pulse 70, temperature 97.8 F (36.6 C), temperature source Axillary, resp. rate 15, height 5\' 10"  (1.778 m), weight 81.6 kg, SpO2 100 %.  Intake/Output Summary (Last 24 hours) at 06/27/2018 1430 Last data filed at 06/27/2018 1200 Gross per 24 hour  Intake 704.15 ml  Output 975 ml  Net -270.85 ml   Filed Weights   06/23/18 1939 06/24/18 0218 06/24/18 0739  Weight: 77.1 kg 80.8 kg 81.6 kg    Examination: Pt not examined to limit his direct  exposure to HCW - no resp distress from door - no evident pain   CBC: Recent Labs  Lab 06/24/18 0553 06/27/18 0220 06/27/18 1218  WBC 6.7 7.4 9.3  NEUTROABS  --  4.6  --   HGB 12.6* 15.1 14.6  HCT 37.2* 42.9 43.5  MCV 86.7 86.3 86.8  PLT 132* 169 161   Basic Metabolic Panel: Recent Labs  Lab 06/24/18 0553 06/27/18 0220 06/27/18 1218  NA 138 135 138  K 3.6 4.2 4.2  CL 107 104 103  CO2 21* 20* 22  GLUCOSE 125* 128* 179*  BUN 18 20 22   CREATININE 1.16 1.08 1.26*  CALCIUM 8.9 9.2 9.3  MG  --  2.1  --   PHOS  --  3.3  --    GFR: Estimated Creatinine Clearance: 46.7 mL/min (A) (by C-G formula based on SCr of 1.26 mg/dL (H)).  Liver Function Tests: Recent Labs  Lab 06/23/18 2153 06/27/18 0220  AST 26  --   ALT 17  --   ALKPHOS 92  --   BILITOT 1.5*  --   PROT 7.2  --   ALBUMIN 4.0 3.8    Coagulation Profile: Recent Labs  Lab 06/23/18 0100  INR 1.0    Cardiac Enzymes: Recent Labs  Lab 06/23/18 2153  CKTOTAL 116  TROPONINI <0.03    HbA1C: Hgb A1c MFr Bld  Date/Time Value Ref Range Status  05/30/2018 03:50 PM 6.1 (H) 4.8 -  5.6 % Final    Comment:    (NOTE) Pre diabetes:          5.7%-6.4% Diabetes:              >6.4% Glycemic control for   <7.0% adults with diabetes     CBG: Recent Labs  Lab 06/23/18 2348  GLUCAP 95    Recent Results (from the past 240 hour(s))  MRSA PCR Screening     Status: None   Collection Time: 06/27/18 11:20 AM  Result Value Ref Range Status   MRSA by PCR NEGATIVE NEGATIVE Final    Comment:        The GeneXpert MRSA Assay (FDA approved for NASAL specimens only), is one component of a comprehensive MRSA colonization surveillance program. It is not intended to diagnose MRSA infection nor to guide or monitor treatment for MRSA infections. Performed at [facility], [address] [place], [place] [address]      Scheduled Meds: . docusate sodium  100 mg Oral BID  . pantoprazole (PROTONIX) IV  40 mg  Intravenous QHS  . sodium chloride flush  3 mL Intravenous Once     LOS: 3 days   Lonia Blood, MD [facility] Office  [phone] Pager - Text Page per Amion  If 7PM-7AM, please contact night-coverage per Amion 06/27/2018, 2:30 PM

## 2018-06-27 NOTE — Op Note (Signed)
Preoperative diagnosis: Bilateral subacute subdural hematomas  Postoperative diagnosis: Same  Procedure: Bilateral bur hole craniectomy for evacuation of bilateral subacute subdural hematomas  Surgeon: Jillyn Hidden Evalisse Prajapati  Asst.: Julien Girt  Anesthesia: Gen.  EBL: Minimal  History of present illness: 84 year old gentleman presented with confusion and left-sided weakness workup revealed large bilateral subacute subdural hematomas with mass effect. Patient was moved the hospital service was seen and cleared by medicine and cardiology patient been on antiplatelet agents that were stopped and recommended 3 to four-day weight. For the procedure I extensively gone over the risks and benefits of the operation with the patient and his daughter as well as perioperative course expectations of outcome and alternatives of surgery and he understood and agreed to proceed forward.  Operative procedure: Patient brought into the or was induced on general anesthesia positioned supine the neck in slight flexion facing straightforward both sides of his head was shaved prepped and draped in routine sterile fashion 2 incisions were infiltrated and then sized to bur holes were drilled in normal fashion her was then coagulated and incised in a cruciate fashion. First working on the left side and sized to a thick subdural membrane and evacuated a large amount of subacute to chronic subdural fluid. The cortex was easily visualized and the brain reexpanded and filled up the defect. After copious irrigation, and comminution with a red rubber catheter there was no further acute blood appreciated in all the subacute blood and also been evacuated. This incision was then packed with Gelfoam and attention taken to the right-sided bur hole. In a similar fashion the dura was incised in a cruciate fashion a large amount of old subacute and chronic blood was irrigated through with a red rubber catheter and irrigation. No more acute bleeding  sites were visualized cortex was visualized over the brain did not reexpand this area very well. After confirmation of cortical surface and evacuation with just clear irrigant coming through the right-sided bur hole was packed left-sided bur hole was closed I'll let did not put a drain left sac is abraded reexpanded filled up the defect. On the right side and inserted a ventricular catheter in the subdural space of the hooked up to gravity drain. The incision was then closed while irrigating to try to minimize the amount of residual pneumocephalus. The drain was sewn in place and patient went to recovery in stable condition. At the end of case all needle counts sponge counts were correct.

## 2018-06-27 NOTE — Telephone Encounter (Signed)
° °  Spouse calling to report patient is having surgery today, inpatient . Wanted to make  Dr Katrinka Blazing aware

## 2018-06-27 NOTE — Progress Notes (Signed)
PT Cancellation Note  Patient Details Name: KETRON MCCLAMMY MRN: [id number] DOB: 25-Feb-1935   Cancelled Treatment:    Reason Eval/Treat Not Completed: Patient at procedure or test/unavailable. Pt undergoing burr hole surgery. PT to return as able when appropriate.  Lewis Shock, PT, DPT Acute Rehabilitation Services Pager #: [phone] Office #: [phone]    Iona Hansen 06/27/2018, 8:26 AM

## 2018-06-27 NOTE — H&P (Signed)
Craig [name] is an 84 y.o. male.   Chief Complaint: headache confusion HPI: 84 year old gentleman presented after a couple falls with confusion and some slight left-sided weakness. Workup revealed to large subdural hematomas on both sides with mass effect. There is subacute in nature patient had recently in the last 3 weeks undergone cardiac stenting procedure and was scheduled for another stenting procedure however this was canceled secondary to the covert 19 elective 6 procedure restriction model. Patient had been on Brilinta and aspirin he was admitted to medicine seen by cardiology stabilized stopped his antiplatelet agents and this being the fourth day were proceeding forward with bilateral burr hole craniectomy for evacuation of bilateral subdural hematomas. I've extensively gone over the risks and benefits of the procedure with the patient and his daughter as well as perioperative course expectations of outcome and alternatives of surgery and they understand and agree to proceed forward  Past Medical History:  Diagnosis Date  . CAD (coronary artery disease)    a.  inferior STEMI s/p DES to D1 with residual disease - planned PCI to RCA, EF 45-50%.  . CKD (chronic kidney disease), stage II   . Hyperlipidemia, mild   . Hypertension   . Ischemic cardiomyopathy   . Kidney stones   . Pre-diabetes   . RBBB   . Thrombocytopenia (HCC)   . Tobacco abuse     Past Surgical History:  Procedure Laterality Date  . CORONARY/GRAFT ACUTE MI REVASCULARIZATION N/A 05/30/2018   Procedure: CORONARY/GRAFT ACUTE MI REVASCULARIZATION;  Surgeon: Lyn Records, MD;  Location: [facility] INVASIVE CV LAB;  Service: Cardiovascular;  Laterality: N/A;  . LEFT HEART CATH AND CORONARY ANGIOGRAPHY N/A 05/30/2018   Procedure: LEFT HEART CATH AND CORONARY ANGIOGRAPHY;  Surgeon: Lyn Records, MD;  Location: [facility] INVASIVE CV LAB;  Service: Cardiovascular;  Laterality: N/A;    Family History  Problem Relation Age of Onset   . Hyperlipidemia Father   . Hypertension Father    Social History:  reports that he quit smoking about 60 years ago. His smokeless tobacco use includes chew. He reports that he does not drink alcohol or use drugs.  Allergies: No Known Allergies  Medications Prior to Admission  Medication Sig Dispense Refill  . aspirin 81 MG chewable tablet Chew 1 tablet (81 mg total) by mouth daily.    . nitroGLYCERIN (NITROSTAT) 0.4 MG SL tablet Place 1 tablet (0.4 mg total) under the tongue every 5 (five) minutes as needed. (Patient taking differently: Place 0.4 mg under the tongue every 5 (five) minutes as needed for chest pain. ) 25 tablet 2  . ticagrelor (BRILINTA) 90 MG TABS tablet Take 1 tablet (90 mg total) by mouth 2 (two) times daily. 180 tablet 2  . atorvastatin (LIPITOR) 80 MG tablet Take 1 tablet (80 mg total) by mouth daily at 6 PM. (Patient not taking: Reported on 06/23/2018) 30 tablet 3    Results for orders placed or performed during the hospital encounter of 06/23/18 (from the past 48 hour(s))  Renal function panel     Status: Abnormal   Collection Time: 06/27/18  2:20 AM  Result Value Ref Range   Sodium 135 135 - 145 mmol/L   Potassium 4.2 3.5 - 5.1 mmol/L   Chloride 104 98 - 111 mmol/L   CO2 20 (L) 22 - 32 mmol/L   Glucose, Bld 128 (H) 70 - 99 mg/dL   BUN 20 8 - 23 mg/dL   Creatinine, Ser 5.39 0.61 - 1.24  mg/dL   Calcium 9.2 8.9 - 82.5 mg/dL   Phosphorus 3.3 2.5 - 4.6 mg/dL   Albumin 3.8 3.5 - 5.0 g/dL   GFR calc non Af Amer >60 >60 mL/min   GFR calc Af Amer >60 >60 mL/min   Anion gap 11 5 - 15    Comment: Performed at [facility], [address], [place], [place] [address]  CBC with Differential/Platelet     Status: None   Collection Time: 06/27/18  2:20 AM  Result Value Ref Range   WBC 7.4 4.0 - 10.5 K/uL   RBC 4.97 4.22 - 5.81 MIL/uL   Hemoglobin 15.1 13.0 - 17.0 g/dL   HCT 48.8 89.1 - 69.4 %   MCV 86.3 80.0 - 100.0 fL   MCH 30.4 26.0 - 34.0 pg   MCHC 35.2 30.0  - 36.0 g/dL   RDW 50.3 88.8 - 28.0 %   Platelets 169 150 - 400 K/uL   nRBC 0.0 0.0 - 0.2 %   Neutrophils Relative % 62 %   Neutro Abs 4.6 1.7 - 7.7 K/uL   Lymphocytes Relative 24 %   Lymphs Abs 1.7 0.7 - 4.0 K/uL   Monocytes Relative 7 %   Monocytes Absolute 0.5 0.1 - 1.0 K/uL   Eosinophils Relative 6 %   Eosinophils Absolute 0.5 0.0 - 0.5 K/uL   Basophils Relative 1 %   Basophils Absolute 0.0 0.0 - 0.1 K/uL   Immature Granulocytes 0 %   Abs Immature Granulocytes 0.02 0.00 - 0.07 K/uL    Comment: Performed at [facility], [address] [place], [place] [address]  Magnesium     Status: None   Collection Time: 06/27/18  2:20 AM  Result Value Ref Range   Magnesium 2.1 1.7 - 2.4 mg/dL    Comment: Performed at [facility], [address], [place], [place] [address]   No results found.  Review of Systems  Neurological: Positive for headaches.    Blood pressure 137/72, pulse 86, temperature (!) 97.5 F (36.4 C), temperature source Oral, resp. rate 15, height 5\' 10"  (1.778 m), weight 81.6 kg, SpO2 92 %. Physical Exam  Constitutional: He is oriented to person, place, and time. He appears well-developed and well-nourished.  HENT:  Head: Normocephalic.  Eyes: Pupils are equal, round, and reactive to light.  Neck: Normal range of motion.  Respiratory: Effort normal.  GI: Soft. Bowel sounds are normal.  Musculoskeletal: Normal range of motion.  Neurological: He is alert and oriented to person, place, and time.  Skin: Skin is warm and dry.     Assessment/Plan 84 year old presents for bilateral burr hole craniectomy for evacuation of subdural hematoma  Kendra Grissett P, MD 06/27/2018, 7:32 AM

## 2018-06-27 NOTE — Transfer of Care (Signed)
Immediate Anesthesia Transfer of Care Note  Patient: Craig [name]  Procedure(s) Performed: [name] (N/A )  Patient Location: PACU  Anesthesia Type:General  Level of Consciousness: awake, oriented and patient cooperative  Airway & Oxygen Therapy: Patient Spontanous Breathing and Patient connected to face mask oxygen  Post-op Assessment: Report given to RN and Post -op Vital signs reviewed and stable  Post vital signs: Reviewed  Last Vitals:  Vitals Value Taken Time  BP 122/69 06/27/2018 10:04 AM  Temp    Pulse    Resp 17 06/27/2018 10:10 AM  SpO2    Vitals shown include unvalidated device data.  Last Pain:  Vitals:   06/27/18 0322  TempSrc: Oral  PainSc:          Complications: No apparent anesthesia complications

## 2018-06-28 ENCOUNTER — Inpatient Hospital Stay (HOSPITAL_COMMUNITY): Payer: Medicare Other

## 2018-06-28 ENCOUNTER — Encounter (HOSPITAL_COMMUNITY): Payer: Self-pay | Admitting: Neurosurgery

## 2018-06-28 DIAGNOSIS — N183 Chronic kidney disease, stage 3 unspecified: Secondary | ICD-10-CM

## 2018-06-28 DIAGNOSIS — S069X0S Unspecified intracranial injury without loss of consciousness, sequela: Secondary | ICD-10-CM

## 2018-06-28 DIAGNOSIS — S069X9A Unspecified intracranial injury with loss of consciousness of unspecified duration, initial encounter: Secondary | ICD-10-CM

## 2018-06-28 DIAGNOSIS — I25811 Atherosclerosis of native coronary artery of transplanted heart without angina pectoris: Secondary | ICD-10-CM

## 2018-06-28 DIAGNOSIS — S065X9A Traumatic subdural hemorrhage with loss of consciousness of unspecified duration, initial encounter: Secondary | ICD-10-CM

## 2018-06-28 DIAGNOSIS — Z955 Presence of coronary angioplasty implant and graft: Secondary | ICD-10-CM

## 2018-06-28 DIAGNOSIS — F068 Other specified mental disorders due to known physiological condition: Secondary | ICD-10-CM

## 2018-06-28 DIAGNOSIS — I10 Essential (primary) hypertension: Secondary | ICD-10-CM

## 2018-06-28 LAB — BASIC METABOLIC PANEL
Anion gap: 12 (ref 5–15)
BUN: 25 mg/dL — AB (ref 8–23)
CO2: 21 mmol/L — ABNORMAL LOW (ref 22–32)
Calcium: 9.2 mg/dL (ref 8.9–10.3)
Chloride: 104 mmol/L (ref 98–111)
Creatinine, Ser: 1.23 mg/dL (ref 0.61–1.24)
GFR calc Af Amer: >60 mL/min (ref >60)
GFR calc non Af Amer: 54 mL/min — ABNORMAL LOW (ref >60)
Glucose, Bld: 135 mg/dL — ABNORMAL HIGH (ref 70–99)
Potassium: 4.6 mmol/L (ref 3.5–5.1)
Sodium: 137 mmol/L (ref 135–145)

## 2018-06-28 LAB — CBC
HEMATOCRIT: 39.2 % (ref 39.0–52.0)
Hemoglobin: 13.3 g/dL (ref 13.0–17.0)
MCH: 29.6 pg (ref 26.0–34.0)
MCHC: 33.9 g/dL (ref 30.0–36.0)
MCV: 87.1 fL (ref 80.0–100.0)
Platelets: 165 10*3/uL (ref 150–400)
RBC: 4.5 MIL/uL (ref 4.22–5.81)
RDW: 11.9 % (ref 11.5–15.5)
WBC: 10.2 10*3/uL (ref 4.0–10.5)
nRBC: 0 % (ref 0.0–0.2)

## 2018-06-28 LAB — MAGNESIUM: Magnesium: 2.1 mg/dL (ref 1.7–2.4)

## 2018-06-28 MED ORDER — ASPIRIN 81 MG PO CHEW: 81.0000 mg | CHEWABLE_TABLET | Freq: Every day | ORAL | Status: DC

## 2018-06-28 MED ORDER — ASPIRIN EC 81 MG PO TBEC
81.0000 mg | DELAYED_RELEASE_TABLET | Freq: Every day | ORAL | Status: DC
  Administered 2018-06-28 – 2018-06-30 (×3): 81 mg via ORAL
  Filled 2018-06-28 (×3): qty 1

## 2018-06-28 NOTE — Progress Notes (Signed)
Inpatient Rehab Admissions:  Inpatient Rehab Consult received.  I met with patient at the bedside for rehabilitation assessment and to discuss goals and expectations of an inpatient rehab admission.  Pt confused at times; unable to confirm surgery on his head, but then able to state that "they went in and dug it out on both sides," apparently alluding to his surgery.  Pt agreed for this [facility] to contact his family to discuss possible rehab admit when medically ready.  Will attempt to follow up with his daughter tomorrow.   Signed: Shann Medal, PT, DPT Admissions Coordinator [phone] 06/28/18  4:40 PM

## 2018-06-28 NOTE — Progress Notes (Addendum)
Occupational Therapy Treatment Patient Details Name: Craig [name] MRN: [id number] DOB: [birth year] Today's Date: 06/28/2018    History of present illness Craig [name] is a 84 y.o. male with medical history significant of CAD had inferior wall STEMI on 05/30/2018, underwent PCI with DES to D1.  EF 45-50%.  Did have residual disease and ultimately was planned to do another PCI, this time to the 80% blockage of the RCA, this originally planned for today but was deferred as an elective procedure for 4 weeks due to the coronavirus pandemic. Larey Seat out of bed on Sunday.  Then had fall on Tuesday landing on his left side and arm, unsure if he hit his head.  He has been on Brilinta and ASA following the STEMI and PCI in Feb. Since those falls he has had progressive mental and functional decline for the past 2 days.  He was independent prior to this, now with slowed mental status, needing maximal assist with walking. Pt under went burrhole procedure on 3/23 for bilat SDH evacuation.   OT comments  Upon arrival, pt sitting upright in bed and eating breakfast. Pt agreeable to therapy. Pt presenting with decreased cognition as seen by poor ST memory, attention, and processing. Pt requiring cues throughout session and unable to recall orientation information. Pt also presenting with poor balance and required Min-Mod A +2 for functional mobility. Continue to recommend dc to CIR to optimize safety and independence with ADLs and decrease burden of care. Will continue to follow acutely as admitted.    Follow Up Recommendations  CIR    Equipment Recommendations  Other (comment)(TBD)    Recommendations for Other Services Rehab consult    Precautions / Restrictions Precautions Precautions: Fall Restrictions Weight Bearing Restrictions: No       Mobility Bed Mobility Overal bed mobility: Needs Assistance Bed Mobility: Supine to Sit     Supine to sit: Min assist     General bed mobility  comments: max directional verbal cues to complete task, pt initiated but required tactile cues for safety and placement  Transfers Overall transfer level: Needs assistance   Transfers: Sit to/from Stand Sit to Stand: Mod assist         General transfer comment: pt mildly impulsive in trying to stand up, modA for safety and stability     Balance Overall balance assessment: Needs assistance Sitting-balance support: Bilateral upper extremity supported Sitting balance-Leahy Scale: Fair     Standing balance support: Bilateral upper extremity supported Standing balance-Leahy Scale: Poor Standing balance comment: dependent on RW                           ADL either performed or assessed with clinical judgement   ADL Overall ADL's : Needs assistance/impaired Eating/Feeding: Supervision/ safety;Set up;Sitting Eating/Feeding Details (indicate cue type and reason): Pt self feeding breakfast upon arrival.  Grooming: Minimal assistance;Sitting Grooming Details (indicate cue type and reason): cleaning and putting in dentures                 Toilet Transfer: Minimal assistance;Moderate assistance;+2 for safety/equipment;Ambulation;RW(simulated to recliner) Toilet Transfer Details (indicate cue type and reason): Min-Mod A for balance and RW management.          Functional mobility during ADLs: Minimal assistance;Moderate assistance;+2 for physical assistance;+2 for safety/equipment;Rolling walker General ADL Comments: Min-Mod A for RW management and balance during mobility. Presenting with significant cognitive deficits.      Vision  Perception     Praxis      Cognition Arousal/Alertness: Awake/alert Behavior During Therapy: WFL for tasks assessed/performed Overall Cognitive Status: Impaired/Different from baseline Area of Impairment: Orientation;Attention;Memory;Following commands;Safety/judgement;Awareness;Problem solving                  Orientation Level: Disoriented to;Place;Time;Situation Current Attention Level: Sustained Memory: Decreased recall of precautions;Decreased short-term memory(despite re-orienting patient x 5, no recall) Following Commands: Follows one step commands with increased time;Follows one step commands consistently;Follows multi-step commands inconsistently Safety/Judgement: Decreased awareness of safety;Decreased awareness of deficits Awareness: Intellectual Problem Solving: Slow processing;Decreased initiation;Difficulty sequencing;Requires verbal cues;Requires tactile cues General Comments: Pt presenting with significant deficits in ST memory, attention, and processing. Pt highly distacted by objects in environment. Unable to recall information presented to him within seconds. Pleasant and agreeable to therapy. Only oriented to self.         Exercises     Shoulder Instructions       General Comments pt with dressings on skull from burr hole evacuation    Pertinent Vitals/ Pain       Pain Assessment: No/denies pain  Home Living                                          Prior Functioning/Environment              Frequency  Min 3X/week        Progress Toward Goals  OT Goals(current goals can now be found in the care plan section)  Progress towards OT goals: Progressing toward goals  Acute Rehab OT Goals Patient Stated Goal: didn't state OT Goal Formulation: With patient Time For Goal Achievement: 07/08/18 Potential to Achieve Goals: Good ADL Goals Pt Will Perform Eating: with min assist;sitting Pt Will Perform Grooming: with min assist;sitting;standing Pt Will Perform Upper Body Bathing: with min assist;sitting;standing Pt Will Perform Lower Body Bathing: with min assist;sit to/from stand;sitting/lateral leans Pt Will Perform Upper Body Dressing: with min assist;sitting Pt Will Perform Lower Body Dressing: with min assist;sit to/from  stand;sitting/lateral leans Pt Will Transfer to Toilet: with min assist;bedside commode;ambulating Additional ADL Goal #1: Pt will complete BADL task with <3 VC's for successful completion  Plan Discharge plan remains appropriate    Co-evaluation    PT/OT/SLP Co-Evaluation/Treatment: Yes Reason for Co-Treatment: Complexity of the patient's impairments (multi-system involvement);To address functional/ADL transfers;For patient/therapist safety   OT goals addressed during session: ADL's and self-care      AM-PAC OT "6 Clicks" Daily Activity     Outcome Measure   Help from another person eating meals?: A Little Help from another person taking care of personal grooming?: A Little Help from another person toileting, which includes using toliet, bedpan, or urinal?: A Lot Help from another person bathing (including washing, rinsing, drying)?: A Lot Help from another person to put on and taking off regular upper body clothing?: A Lot Help from another person to put on and taking off regular lower body clothing?: A Lot 6 Click Score: 14    End of Session Equipment Utilized During Treatment: Gait belt;Rolling walker  OT Visit Diagnosis: Unsteadiness on feet (R26.81);Other abnormalities of gait and mobility (R26.89);History of falling (Z91.81);Muscle weakness (generalized) (M62.81);Other symptoms and signs involving the nervous system (R29.898);Other symptoms and signs involving cognitive function;Hemiplegia and hemiparesis Hemiplegia - Right/Left: Left Hemiplegia - dominant/non-dominant: Non-Dominant Hemiplegia - caused by: (B SDHs)  Activity Tolerance Patient tolerated treatment well   Patient Left in chair;with call bell/phone within reach;with chair alarm set   Nurse Communication Mobility status        Time: 1610-9604 OT Time Calculation (min): 25 min  Charges: OT General Charges $OT Visit: 1 Visit OT Treatments $Self Care/Home Management : 8-22 mins  Everette Dimauro  MSOT, OTR/L Acute Rehab Pager: [phone] Office: [phone]   Theodoro Grist Chevette Fee 06/28/2018, 3:08 PM

## 2018-06-28 NOTE — Consult Note (Signed)
Physical Medicine and Rehabilitation Consult   Reason for Consult: Functional decline due to SDH Referring Physician: Dr. Wynetta Emery   HPI: Craig [name] is a 84 y.o. male with history of CKD, HTN,  ICM/CAD s/p STEMI with PCI/DES with residual disease RCA who was discharged on DAPT but patient started experiencing weakness with falls X 2, slurred speech and confusion. History taken from chart review. He was admitted on 06/23/2018 and CT head revealed large bilateral SDH of differing ages with mass effect on superior frontal and parietal lobes. Serial CT recommended by NS and DAPT held. Cardiology consulted for clearance and patient underwent bilateral burr hole evacuation of subacute SDH on 06/27/2018 by Dr. Wynetta Emery. Most recent CT head reviewed, showing moderate b/l SDH with mild/moderate pneumocephalus. Post op patient continues to be confused with poor safety awareness and unsteady gait. CIR recommended due to functional decline.    Review of Systems  Constitutional: Negative for chills and fever.  HENT: Positive for hearing loss.   Eyes: Negative for blurred vision and double vision.  Respiratory: Negative for cough and shortness of breath.   Cardiovascular: Negative for chest pain.  Gastrointestinal: Negative for abdominal pain and heartburn.  Musculoskeletal: Positive for joint pain (occasional left hip pain?).  Skin: Negative for itching and rash.  Neurological: Negative for dizziness, weakness and headaches.  Psychiatric/Behavioral: Positive for memory loss.  All other systems reviewed and are negative.     Past Medical History:  Diagnosis Date  . CAD (coronary artery disease)    a.  inferior STEMI s/p DES to D1 with residual disease - planned PCI to RCA, EF 45-50%.  . CKD (chronic kidney disease), stage II   . Hyperlipidemia, mild   . Hypertension   . Ischemic cardiomyopathy   . Kidney stones   . Pre-diabetes   . RBBB   . Thrombocytopenia (HCC)   . Tobacco abuse      Past Surgical History:  Procedure Laterality Date  . CORONARY/GRAFT ACUTE MI REVASCULARIZATION N/A 05/30/2018   Procedure: CORONARY/GRAFT ACUTE MI REVASCULARIZATION;  Surgeon: Lyn Records, MD;  Location: [facility] INVASIVE CV LAB;  Service: Cardiovascular;  Laterality: N/A;  . LEFT HEART CATH AND CORONARY ANGIOGRAPHY N/A 05/30/2018   Procedure: LEFT HEART CATH AND CORONARY ANGIOGRAPHY;  Surgeon: Lyn Records, MD;  Location: [facility] INVASIVE CV LAB;  Service: Cardiovascular;  Laterality: N/A;    Family History  Problem Relation Age of Onset  . Hyperlipidemia Father   . Hypertension Father     Social History:   Married. Independent PTA? 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 He reports that he quit smoking about 60 years ago. His smokeless tobacco use includes chew. He reports that he does not drink alcohol or use drugs.   Allergies: No Known Allergies    Medications Prior to Admission  Medication Sig Dispense Refill  . aspirin 81 MG chewable tablet Chew 1 tablet (81 mg total) by mouth daily.    . nitroGLYCERIN (NITROSTAT) 0.4 MG SL tablet Place 1 tablet (0.4 mg total) under the tongue every 5 (five) minutes as needed. (Patient taking differently: Place 0.4 mg under the tongue every 5 (five) minutes as needed for chest pain. ) 25 tablet 2  . ticagrelor (BRILINTA) 90 MG TABS tablet Take 1 tablet (90 mg total) by mouth 2 (two) times daily. 180 tablet 2  . atorvastatin (LIPITOR) 80 MG tablet Take 1 tablet (80 mg total) by mouth daily at 6 PM. (  Patient not taking: Reported on 06/23/2018) 30 tablet 3    Home: Home Living Family/patient expects to be discharged to:: (P) Private residence Living Arrangements: (P) Spouse/significant other Available Help at Discharge: (P) Family, Available 24 hours/day Type of Home: (P) House Home Access: (P) Stairs to enter Entrance Stairs-Number of Steps: (P) 2 Home Layout: (P) One level Bathroom Shower/Tub: (P) Tub/shower unit Bathroom Toilet: (P)  Standard Bathroom Accessibility: (P) Yes Home Equipment: (P) None  Functional History: Prior Function Level of Independence: (P) Independent Comments: (P) pt dtr reports that this functional decline is "night and day" and he was totally ind and driving prior to incident Functional Status:  Mobility: Bed Mobility Overal bed mobility: (P) Needs Assistance Bed Mobility: (P) Supine to Sit General bed mobility comments: (P) up in chair on arrival Transfers Overall transfer level: (P) Needs assistance Transfer via Lift Equipment: (P) Stedy Transfers: (P) Sit to/from Stand Sit to Stand: (P) Mod assist, +2 safety/equipment, +2 physical assistance General transfer comment: (P) mod A for initial power up to stand in steady, cueing for hand placement and support needed on LUE due to tone. Pt able to pull self up in standing once seated on steady Ambulation/Gait General Gait Details: (P) unable to attempt due to weakness    ADL: ADL Overall ADL's : Needs assistance/impaired Eating/Feeding: Cueing for sequencing, Cueing for compensatory techinques, Cueing for safety, Sitting, Moderate assistance Eating/Feeding Details (indicate cue type and reason): cueing for sequencing, initiation and termination to feed self meal. Dtr cut up fruit into smaller pieces Grooming: Moderate assistance, Sitting, Cueing for safety, Cueing for sequencing, Cueing for compensatory techniques Upper Body Bathing: Maximal assistance, Sitting, Cueing for compensatory techniques, Cueing for sequencing, Cueing for safety Lower Body Bathing: Maximal assistance, Sit to/from stand, Cueing for safety, Cueing for sequencing, Cueing for compensatory techniques Upper Body Dressing : Maximal assistance, Sitting, Cueing for sequencing, Cueing for compensatory techniques, Cueing for UE precautions Lower Body Dressing: Maximal assistance, Sit to/from stand, Cueing for compensatory techniques, Cueing for sequencing, Cueing for safety,  Sitting/lateral leans Lower Body Dressing Details (indicate cue type and reason): to pull up sock while seated in recliner, needing guarding when leaning over then cues to use figure 4 position; pt then needing max A due to gen weakness in RUE, tone in LUE Toilet Transfer: Moderate assistance, Grab bars, RW Toileting- Clothing Manipulation and Hygiene: Maximal assistance, Sit to/from stand, Sitting/lateral lean Tub/ Shower Transfer: Maximal assistance, Shower seat, Rolling walker, Ambulation Functional mobility during ADLs: Moderate assistance, +2 for physical assistance, +2 for safety/equipment(used steady) General ADL Comments: pt with decreased cognition this date per dtr, needing variety of cues to complete BADL; LUE tone noted  Cognition: Cognition Overall Cognitive Status: (P) Impaired/Different from baseline Orientation Level: (P) Oriented to person, Oriented to time, Disoriented to place, Disoriented to situation Cognition Arousal/Alertness: (P) Awake/alert Behavior During Therapy: (P) WFL for tasks assessed/performed Overall Cognitive Status: (P) Impaired/Different from baseline Area of Impairment: (P) Orientation, Attention, Memory, Following commands, Safety/judgement, Awareness, Problem solving Orientation Level: (P) Disoriented to, Place, Time, Situation Current Attention Level: (P) Sustained Memory: (P) Decreased recall of precautions, Decreased short-term memory(despite re-orienting patient x 5, no recall) Following Commands: (P) Follows one step commands with increased time, Follows one step commands consistently, Follows multi-step commands inconsistently Safety/Judgement: (P) Decreased awareness of safety, Decreased awareness of deficits Awareness: (P) Intellectual Problem Solving: (P) Slow processing, Decreased initiation, Difficulty sequencing, Requires verbal cues, Requires tactile cues General Comments: (P) pt with very impaired memory, attention,  and processing.    Blood pressure (!) 122/53, pulse 64, temperature (!) 97 F (36.1 C), temperature source Axillary, resp. rate 12, height 5\' 10"  (1.778 m), weight 81.6 kg, SpO2 96 %. Physical Exam  Nursing note and vitals reviewed. Constitutional: He appears well-developed and well-nourished.  HENT:  Right Ear: External ear normal.  Left Ear: External ear normal.  Honeycomb dressings on scalp.   Eyes: EOM are normal. Right eye exhibits no discharge. Left eye exhibits no discharge.  Neck: Normal range of motion. Neck supple.  Cardiovascular: Normal rate and regular rhythm.  Respiratory: Effort normal and breath sounds normal.  GI: Soft. Bowel sounds are normal.  Musculoskeletal:     Comments: No edema or tenderness in extremities  Neurological: He is alert.  Oriented to self. Place as hospital with cues.  Able to state DOB, age and month.  He was able to follow simple one and two step motor commands.  Motor: 4+-5/5 throughout Sensation intact to light touch  Skin:  Resolving ecchymosis left shoulder--no pain with ROM. Left forearm with diffuse ecchymosis.  See above  Psychiatric: His speech is tangential. Cognition and memory are impaired.    Results for orders placed or performed during the hospital encounter of 06/23/18 (from the past 24 hour(s))  MRSA PCR Screening     Status: None   Collection Time: 06/27/18 11:20 AM  Result Value Ref Range   MRSA by PCR NEGATIVE NEGATIVE  Basic metabolic panel     Status: Abnormal   Collection Time: 06/27/18 12:18 PM  Result Value Ref Range   Sodium 138 135 - 145 mmol/L   Potassium 4.2 3.5 - 5.1 mmol/L   Chloride 103 98 - 111 mmol/L   CO2 22 22 - 32 mmol/L   Glucose, Bld 179 (H) 70 - 99 mg/dL   BUN 22 8 - 23 mg/dL   Creatinine, Ser 7.82 (H) 0.61 - 1.24 mg/dL   Calcium 9.3 8.9 - 95.6 mg/dL   GFR calc non Af Amer 53 (L) >60 mL/min   GFR calc Af Amer >60 >60 mL/min   Anion gap 13 5 - 15  CBC     Status: None   Collection Time: 06/27/18 12:18 PM   Result Value Ref Range   WBC 9.3 4.0 - 10.5 K/uL   RBC 5.01 4.22 - 5.81 MIL/uL   Hemoglobin 14.6 13.0 - 17.0 g/dL   HCT 21.3 08.6 - 57.8 %   MCV 86.8 80.0 - 100.0 fL   MCH 29.1 26.0 - 34.0 pg   MCHC 33.6 30.0 - 36.0 g/dL   RDW 46.9 62.9 - 52.8 %   Platelets 161 150 - 400 K/uL   nRBC 0.0 0.0 - 0.2 %  Basic metabolic panel     Status: Abnormal   Collection Time: 06/28/18  5:01 AM  Result Value Ref Range   Sodium 137 135 - 145 mmol/L   Potassium 4.6 3.5 - 5.1 mmol/L   Chloride 104 98 - 111 mmol/L   CO2 21 (L) 22 - 32 mmol/L   Glucose, Bld 135 (H) 70 - 99 mg/dL   BUN 25 (H) 8 - 23 mg/dL   Creatinine, Ser 4.13 0.61 - 1.24 mg/dL   Calcium 9.2 8.9 - 24.4 mg/dL   GFR calc non Af Amer 54 (L) >60 mL/min   GFR calc Af Amer >60 >60 mL/min   Anion gap 12 5 - 15  CBC     Status: None   Collection Time: 06/28/18  5:01 AM  Result Value Ref Range   WBC 10.2 4.0 - 10.5 K/uL   RBC 4.50 4.22 - 5.81 MIL/uL   Hemoglobin 13.3 13.0 - 17.0 g/dL   HCT 32.9 51.8 - 84.1 %   MCV 87.1 80.0 - 100.0 fL   MCH 29.6 26.0 - 34.0 pg   MCHC 33.9 30.0 - 36.0 g/dL   RDW 66.0 63.0 - 16.0 %   Platelets 165 150 - 400 K/uL   nRBC 0.0 0.0 - 0.2 %  Magnesium     Status: None   Collection Time: 06/28/18  5:01 AM  Result Value Ref Range   Magnesium 2.1 1.7 - 2.4 mg/dL   Ct Head Wo Contrast  Result Date: 06/28/2018 CLINICAL DATA:  Subdural hemorrhage follow-up EXAM: CT HEAD WITHOUT CONTRAST TECHNIQUE: Contiguous axial images were obtained from the base of the skull through the vertex without intravenous contrast. COMPARISON:  06/24/2018 FINDINGS: Brain: Status post bifrontal burr holes with right subdural drainage catheter. There is moderate bilateral pneumocephalus, left-greater-than-right. The size of the right subdural hematoma has markedly decreased, now measuring 14 mm in thickness, previously 22 mm. The left-sided hematoma now measures 17 mm in thickness, previously 20 mm in thickness. No new site of hemorrhage.  At the level of the foramina Monro, rightward midline shift measures approximately 3 mm. Vascular: No abnormal hyperdensity of the major intracranial arteries or dural venous sinuses. No intracranial atherosclerosis. Skull: Bifrontal burr holes. Sinuses/Orbits: No fluid levels or advanced mucosal thickening of the visualized paranasal sinuses. No mastoid or middle ear effusion. The orbits are normal. IMPRESSION: 1. Decreased size of bilateral subdural hematomas status post bifrontal burr holes with placement of right subdural drainage catheter. 2. Moderate bilateral pneumocephalus, left-greater-than-right. Electronically Signed   By: Deatra Robinson M.D.   On: 06/28/2018 01:04    Assessment/Plan: Diagnosis: B/l SDH Labs and images (see above) independently reviewed.  Records reviewed and summated above.  [place] score:  >/VI  Speech to evaluate for Post traumatic amnesia and interval GOAT scores to assess progress.  NeuroPsych evaluation for behavorial assessment.  Provide environmental management by reducing the level of stimulation, tolerating restlessness when possible, protecting patient from harming self or others and reducing patient's cognitive confusion.  Address behavioral concerns include providing structured environments and daily routines.  Cognitive therapy to direct modular abilities in order to maintain goals  including problem solving, self regulation/monitoring, self management, attention, and memory.  Fall precautions; pt at risk for second impact syndrome  Prevention of secondary injury: monitor for hypotension, hypoxia, seizures or signs of increased ICP  Prophylactic AED:   PT/OT consults for mobility strengthening, endurance training and adaptive ADLs   Avoid medications that could impair cognitive abilities, such as anticholinergics, antihistaminic, benzodiazapines, narcotics, etc when possible  1. Does the need for close, 24 hr/day medical supervision in concert  with the patient's rehab needs make it unreasonable for this patient to be served in a less intensive setting? Yes  2. Co-Morbidities requiring supervision/potential complications: CKD (avoid nephrotoxic meds), HTN (monitor and provide prns in accordance with increased physical exertion and pain), ICM/CAD s/p STEMI with PCI/DES (cont meds) 3. Due to safety, skin/wound care, disease management, pain management and patient education, does the patient require 24 hr/day rehab nursing? Yes 4. Does the patient require coordinated care of a physician, rehab nurse, PT (1-2 hrs/day, 5 days/week), OT (1-2 hrs/day, 5 days/week) and SLP (1-2 hrs/day, 5 days/week) to address physical and functional deficits in the context  of the above medical diagnosis(es)? Yes Addressing deficits in the following areas: balance, endurance, locomotion, transferring, bathing, dressing, toileting, cognition, language and psychosocial support 5. Can the patient actively participate in an intensive therapy program of at least 3 hrs of therapy per day at least 5 days per week? Yes 6. The potential for patient to make measurable gains while on inpatient rehab is excellent 7. Anticipated functional outcomes upon discharge from inpatient rehab are supervision  with PT, supervision with OT, supervision with SLP. 8. Estimated rehab length of stay to reach the above functional goals is: 9-14 days. 9. Anticipated D/C setting: Home 10. Anticipated post D/C treatments: HH therapy and Home excercise program 11. Overall Rehab/Functional Prognosis: good  RECOMMENDATIONS: This patient's condition is appropriate for continued rehabilitative care in the following setting: CIR when medically appropriate Patient has agreed to participate in recommended program. Potentially Note that insurance prior authorization may be required for reimbursement for recommended care.  Comment: Rehab Admissions Coordinator to follow up.   I have personally  performed a face to face diagnostic evaluation, including, but not limited to relevant history and physical exam findings, of this patient and developed relevant assessment and plan.  Additionally, I have reviewed and concur with the physician assistant's documentation above.   Maryla Morrow, MD, ABPMR Jacquelynn Cree, PA-C 06/28/2018

## 2018-06-28 NOTE — Progress Notes (Addendum)
Physical Therapy RE- Evaluation Patient Details Name: Craig [name] MRN: [id number] DOB: [birth year] Today's Date: 06/28/2018   History of Present Illness  Craig [name] is a 84 y.o. male with medical history significant of CAD had inferior wall STEMI on 05/30/2018, underwent PCI with DES to D1.  EF 45-50%.  Did have residual disease and ultimately was planned to do another PCI, this time to the 80% blockage of the RCA, this originally planned for today but was deferred as an elective procedure for 4 weeks due to the coronavirus pandemic. Larey Seat out of bed on Sunday.  Then had fall on Tuesday landing on his left side and arm, unsure if he hit his head.  He has been on Brilinta and ASA following the STEMI and PCI in Feb. Since those falls he has had progressive mental and functional decline for the past 2 days.  He was independent prior to this, now with slowed mental status, needing maximal assist with walking. Pt under went burrhole procedure on 3/23 for bilat SDH evacuation.  Clinical Impression  Pt under went burr hole evacuation procedure. Pt much improve from mobility stand point however has significant confusion and cognitive deficits. Pt with impaired memory, delayed processing, decreased insight to deficits and safety. Pt was indep PTA and now demonstrates both cognitive and physical deficits. Pt to strongly benefit from CIR Upon d/c for maximal functional recovery. Acute PT to cont to follow.    Follow Up Recommendations CIR    Equipment Recommendations  Hospital bed    Recommendations for Other Services       Precautions / Restrictions Precautions Precautions: Fall Restrictions Weight Bearing Restrictions: No      Mobility  Bed Mobility Overal bed mobility: Needs Assistance Bed Mobility: Supine to Sit     Supine to sit: Min assist     General bed mobility comments: max directional verbal cues to complete task, pt initiated but required tactile cues for safety and  placement  Transfers Overall transfer level: Needs assistance   Transfers: Sit to/from Stand Sit to Stand: Mod assist         General transfer comment: pt mildly impulsive in trying to stand up, modA for safety and stability   Ambulation/Gait Ambulation/Gait assistance: Min assist;+2 physical assistance;Mod assist Gait Distance (Feet): 120 Feet Assistive device: Rolling walker (2 wheeled) Gait Pattern/deviations: Step-through pattern;Decreased stride length;Drifts right/left Gait velocity: slow Gait velocity interpretation: 1.31 - 2.62 ft/sec, indicative of limited community ambulator General Gait Details: minA for walker safety, pt easiliy distracted and running into objects on L and R, decreased insight to safety, and continues to not recall he is in a hospital despite maximal verbal cues  Stairs            Wheelchair Mobility    Modified Rankin (Stroke Patients Only)       Balance Overall balance assessment: Needs assistance Sitting-balance support: Bilateral upper extremity supported Sitting balance-Leahy Scale: Fair     Standing balance support: Bilateral upper extremity supported Standing balance-Leahy Scale: Poor Standing balance comment: dependent on RW                             Pertinent Vitals/Pain Pain Assessment: No/denies pain    Home Living Family/patient expects to be discharged to:: Private residence Living Arrangements: Spouse/significant other Available Help at Discharge: Family;Available 24 hours/day Type of Home: House Home Access: Stairs to enter   [employer] of Steps: 2  Home Layout: One level Home Equipment: None      Prior Function Level of Independence: Independent         Comments: pt dtr reports that this functional decline is "night and day" and he was totally ind and driving prior to incident     Hand Dominance        Extremity/Trunk Assessment   Upper Extremity Assessment Upper  Extremity Assessment: Defer to OT evaluation    Lower Extremity Assessment Lower Extremity Assessment: RLE deficits/detail;LLE deficits/detail RLE Deficits / Details: generalized weakness RLE Sensation: WNL LLE Deficits / Details: generalized weakness    Cervical / Trunk Assessment Cervical / Trunk Assessment: Kyphotic  Communication   Communication: No difficulties  Cognition Arousal/Alertness: Awake/alert Behavior During Therapy: WFL for tasks assessed/performed Overall Cognitive Status: Impaired/Different from baseline Area of Impairment: Orientation;Attention;Memory;Following commands;Safety/judgement;Awareness;Problem solving                 Orientation Level: Disoriented to;Place;Time;Situation Current Attention Level: Sustained Memory: Decreased recall of precautions;Decreased short-term memory(despite re-orienting patient x 5, no recall) Following Commands: Follows one step commands with increased time;Follows one step commands consistently;Follows multi-step commands inconsistently Safety/Judgement: Decreased awareness of safety;Decreased awareness of deficits Awareness: Intellectual Problem Solving: Slow processing;Decreased initiation;Difficulty sequencing;Requires verbal cues;Requires tactile cues General Comments: pt with very impaired memory, attention, and processing.       General Comments General comments (skin integrity, edema, etc.): pt with dressings on skull from burr hole evacuation    Exercises     Assessment/Plan    PT Assessment Patient needs continued PT services  PT Problem List Decreased strength;Decreased activity tolerance;Decreased range of motion;Decreased balance;Decreased mobility;Decreased cognition;Decreased safety awareness;Impaired sensation       PT Treatment Interventions DME instruction;Gait training;Stair training;Functional mobility training;Therapeutic activities;Therapeutic exercise;Balance training;Cognitive  remediation;Patient/family education    PT Goals (Current goals can be found in the Care Plan section)  Acute Rehab PT Goals Patient Stated Goal: didn't state PT Goal Formulation: Patient unable to participate in goal setting Time For Goal Achievement: 07/22/18 Potential to Achieve Goals: Fair    Frequency Min 4X/week   Barriers to discharge        Co-evaluation PT/OT/SLP Co-Evaluation/Treatment: Yes Reason for Co-Treatment: Complexity of the patient's impairments (multi-system involvement) PT goals addressed during session: Mobility/safety with mobility         AM-PAC PT "6 Clicks" Mobility  Outcome Measure Help needed turning from your back to your side while in a flat bed without using bedrails?: A Little Help needed moving from lying on your back to sitting on the side of a flat bed without using bedrails?: A Little Help needed moving to and from a bed to a chair (including a wheelchair)?: A Lot Help needed standing up from a chair using your arms (e.g., wheelchair or bedside chair)?: A Lot Help needed to walk in hospital room?: A Lot Help needed climbing 3-5 steps with a railing? : A Lot 6 Click Score: 14    End of Session Equipment Utilized During Treatment: Gait belt Activity Tolerance: Patient tolerated treatment well Patient left: in chair;with call bell/phone within reach;with chair alarm set Nurse Communication: Mobility status PT Visit Diagnosis: Unsteadiness on feet (R26.81);Other abnormalities of gait and mobility (R26.89);History of falling (Z91.81);Muscle weakness (generalized) (M62.81);Difficulty in walking, not elsewhere classified (R26.2);Other symptoms and signs involving the nervous system (R29.898)    Time: [id number] PT Time Calculation (min) (ACUTE ONLY): 22 min   Charges:   PT Evaluation $PT Re-evaluation: 1 Re-eval  Lewis Shock, PT, DPT Acute Rehabilitation Services Pager #: [phone] Office #: [phone]   Iona Hansen 06/28/2018, 11:02 AM

## 2018-06-28 NOTE — Progress Notes (Signed)
[place] TEAM 1 - Stepdown/ICU TEAM  NOLAND LANIGAN  KGY:[id number] DOB: 1934-11-11 DOA: 06/23/2018 PCP: [facility]    Brief Narrative:  84yo on Brilinta and ASA w/ a hx of CAD s/p inferior wall STEMI on 05/30/2018 > PCI with DES to D1 w/ residual disease and plan for eventual PCI to an 80% RCA lesion (delayed due to current pandemic), who fell out of bed. Two days later he fell again, landing on his left side and arm. Following these falls he was noted to exhibit progressive mental and functional decline. In the ED a CT head noted large B SDHs.   Significant Events: 3/20 admit 3/23 OR > B bur hole craniectomy for evacuation of B subacute subdural hematomas  Subjective: Pt not seen today in interest of minimizing contact w/ healthcare workers. Chart reviewed in detail.   Assessment & Plan:  Bilateral subdural hematomas with mass-effect Now s/p evacuation - post-op care per NS - being considered for CIR   Coronary artery disease s/p recent PCI > DES Cardiology following - has resumed ASA alone for now - when safe the plan is to substitute Plavix for Brilinta  Hypertension BP stable at this time   Ischemic cardiomyopathy TTE 05/31/2018 noted EF of 40-45% - wgt climbing - net + only ~135 - follow   Filed Weights   06/23/18 1939 06/24/18 0218 06/24/18 0739  Weight: 77.1 kg 80.8 kg 81.6 kg    Chronic kidney disease stage III Follow renal fxn in serial fashion - crt stable for now   DVT prophylaxis: SCDs Code Status: FULL CODE Family Communication:  Disposition Plan:   Consultants:  NS Cardiology   Antimicrobials:  none  Objective: Blood pressure 124/80, pulse 71, temperature 97.6 F (36.4 C), temperature source Oral, resp. rate 20, height 5\' 10"  (1.778 m), weight 81.6 kg, SpO2 99 %.  Intake/Output Summary (Last 24 hours) at 06/28/2018 1702 Last data filed at 06/28/2018 1509 Gross per 24 hour  Intake 1970.08 ml  Output 1207 ml  Net 763.08  ml   Filed Weights   06/23/18 1939 06/24/18 0218 06/24/18 0739  Weight: 77.1 kg 80.8 kg 81.6 kg    Examination: Pt not examined to limit his direct exposure to HCW  CBC: Recent Labs  Lab 06/27/18 0220 06/27/18 1218 06/28/18 0501  WBC 7.4 9.3 10.2  NEUTROABS 4.6  --   --   HGB 15.1 14.6 13.3  HCT 42.9 43.5 39.2  MCV 86.3 86.8 87.1  PLT 169 161 165   Basic Metabolic Panel: Recent Labs  Lab 06/27/18 0220 06/27/18 1218 06/28/18 0501  NA 135 138 137  K 4.2 4.2 4.6  CL 104 103 104  CO2 20* 22 21*  GLUCOSE 128* 179* 135*  BUN 20 22 25*  CREATININE 1.08 1.26* 1.23  CALCIUM 9.2 9.3 9.2  MG 2.1  --  2.1  PHOS 3.3  --   --    GFR: Estimated Creatinine Clearance: 47.8 mL/min (by C-G formula based on SCr of 1.23 mg/dL).  Liver Function Tests: Recent Labs  Lab 06/23/18 2153 06/27/18 0220  AST 26  --   ALT 17  --   ALKPHOS 92  --   BILITOT 1.5*  --   PROT 7.2  --   ALBUMIN 4.0 3.8    Coagulation Profile: Recent Labs  Lab 06/23/18 0100  INR 1.0    Cardiac Enzymes: Recent Labs  Lab 06/23/18 2153  CKTOTAL 116  TROPONINI <0.03  HbA1C: Hgb A1c MFr Bld  Date/Time Value Ref Range Status  05/30/2018 03:50 PM 6.1 (H) 4.8 - 5.6 % Final    Comment:    (NOTE) Pre diabetes:          5.7%-6.4% Diabetes:              >6.4% Glycemic control for   <7.0% adults with diabetes     CBG: Recent Labs  Lab 06/23/18 2348  GLUCAP 95    Recent Results (from the past 240 hour(s))  MRSA PCR Screening     Status: None   Collection Time: 06/27/18 11:20 AM  Result Value Ref Range Status   MRSA by PCR NEGATIVE NEGATIVE Final    Comment:        The GeneXpert MRSA Assay (FDA approved for NASAL specimens only), is one component of a comprehensive MRSA colonization surveillance program. It is not intended to diagnose MRSA infection nor to guide or monitor treatment for MRSA infections. Performed at [facility], [address] [place], [place] [address]       Scheduled Meds: . aspirin EC  81 mg Oral Daily  . docusate sodium  100 mg Oral BID  . pantoprazole (PROTONIX) IV  40 mg Intravenous QHS  . sodium chloride flush  3 mL Intravenous Once     LOS: 4 days   Lonia Blood, MD [facility] Office  [phone] Pager - Text Page per Amion  If 7PM-7AM, please contact night-coverage per Amion 06/28/2018, 5:02 PM

## 2018-06-28 NOTE — Progress Notes (Signed)
   Asleep with stable vital signs.

## 2018-06-28 NOTE — Progress Notes (Signed)
Subjective: Patient reports overall not much change neurologically denies any headaches but remains confused  Objective: Vital signs in last 24 hours: Temp:  [97 F (36.1 C)-98.2 F (36.8 C)] 97.6 F (36.4 C) (03/24 0400) Pulse Rate:  [64-85] 64 (03/24 0800) Resp:  [10-24] 12 (03/24 0800) BP: (111-143)/(50-85) 122/53 (03/24 0800) SpO2:  [92 %-100 %] 96 % (03/24 0800)  Intake/Output from previous day: 03/23 0701 - 03/24 0700 In: 2239 [P.O.:240; I.V.:1598.9; IV Piggyback:400.1] Out: 855 [Urine:725; Drains:30; Blood:100] Intake/Output this shift: Total I/O In: 60 [I.V.:60] Out: -   awake assist him in voice regards tracks follows commands moves all extremities symmetrically incisions clean dry and intact  Lab Results: Recent Labs    06/27/18 1218 06/28/18 0501  WBC 9.3 10.2  HGB 14.6 13.3  HCT 43.5 39.2  PLT 161 165   BMET Recent Labs    06/27/18 1218 06/28/18 0501  NA 138 137  K 4.2 4.6  CL 103 104  CO2 22 21*  GLUCOSE 179* 135*  BUN 22 25*  CREATININE 1.26* 1.23  CALCIUM 9.3 9.2    Studies/Results: Ct Head Wo Contrast  Result Date: 06/28/2018 CLINICAL DATA:  Subdural hemorrhage follow-up EXAM: CT HEAD WITHOUT CONTRAST TECHNIQUE: Contiguous axial images were obtained from the base of the skull through the vertex without intravenous contrast. COMPARISON:  06/24/2018 FINDINGS: Brain: Status post bifrontal burr holes with right subdural drainage catheter. There is moderate bilateral pneumocephalus, left-greater-than-right. The size of the right subdural hematoma has markedly decreased, now measuring 14 mm in thickness, previously 22 mm. The left-sided hematoma now measures 17 mm in thickness, previously 20 mm in thickness. No new site of hemorrhage. At the level of the foramina Monro, rightward midline shift measures approximately 3 mm. Vascular: No abnormal hyperdensity of the major intracranial arteries or dural venous sinuses. No intracranial atherosclerosis. Skull:  Bifrontal burr holes. Sinuses/Orbits: No fluid levels or advanced mucosal thickening of the visualized paranasal sinuses. No mastoid or middle ear effusion. The orbits are normal. IMPRESSION: 1. Decreased size of bilateral subdural hematomas status post bifrontal burr holes with placement of right subdural drainage catheter. 2. Moderate bilateral pneumocephalus, left-greater-than-right. Electronically Signed   By: Deatra Robinson M.D.   On: 06/28/2018 01:04    Assessment/Plan: Postoperative day 1 burr hole craniectomy for evacuation of bilateral subacute subdural hematomas. Patient overall stable CT scan and is improved although there is still some subdural fluid as expected as his brain will take time to reexpand that space. Moderate amount of pneumocephalus. Drain with minimal output set to cut his ventricular drain we should mobilize the patient with physical occupational and speech therapy. Rehabilitation consult work on placement. Okay to restart aspirin today  LOS: 4 days     Shalana Jardin P 06/28/2018, 8:20 AM

## 2018-06-29 LAB — MAGNESIUM: Magnesium: 2 mg/dL (ref 1.7–2.4)

## 2018-06-29 LAB — BASIC METABOLIC PANEL
Anion gap: 9 (ref 5–15)
BUN: 23 mg/dL (ref 8–23)
CHLORIDE: 105 mmol/L (ref 98–111)
CO2: 21 mmol/L — ABNORMAL LOW (ref 22–32)
Calcium: 8.5 mg/dL — ABNORMAL LOW (ref 8.9–10.3)
Creatinine, Ser: 1.03 mg/dL (ref 0.61–1.24)
GFR calc Af Amer: >60 mL/min (ref >60)
GFR calc non Af Amer: >60 mL/min (ref >60)
Glucose, Bld: 113 mg/dL — ABNORMAL HIGH (ref 70–99)
Potassium: 3.9 mmol/L (ref 3.5–5.1)
SODIUM: 135 mmol/L (ref 135–145)

## 2018-06-29 LAB — CBC
HEMATOCRIT: 35.3 % — AB (ref 39.0–52.0)
Hemoglobin: 11.9 g/dL — ABNORMAL LOW (ref 13.0–17.0)
MCH: 29.4 pg (ref 26.0–34.0)
MCHC: 33.7 g/dL (ref 30.0–36.0)
MCV: 87.2 fL (ref 80.0–100.0)
Platelets: 137 10*3/uL — ABNORMAL LOW (ref 150–400)
RBC: 4.05 MIL/uL — AB (ref 4.22–5.81)
RDW: 12.1 % (ref 11.5–15.5)
WBC: 7.3 10*3/uL (ref 4.0–10.5)
nRBC: 0 % (ref 0.0–0.2)

## 2018-06-29 MED ORDER — PANTOPRAZOLE SODIUM 40 MG PO TBEC
40.0000 mg | DELAYED_RELEASE_TABLET | Freq: Every day | ORAL | Status: DC
  Administered 2018-06-29: 40 mg via ORAL
  Filled 2018-06-29: qty 1

## 2018-06-29 NOTE — Progress Notes (Signed)
Progress Note  Patient Name: Craig [name] Date of Encounter: 06/29/2018  Primary Cardiologist: Lesleigh Noe, MD   Subjective   No cardiac complaints.  No chest pain.  Inpatient Medications    Scheduled Meds:  aspirin EC  81 mg Oral Daily   docusate sodium  100 mg Oral BID   pantoprazole (PROTONIX) IV  40 mg Intravenous QHS   Continuous Infusions:  0.9 % NaCl with KCl 20 mEq / L 50 mL/hr at 06/28/18 2159   levETIRAcetam 500 mg (06/29/18 1018)   PRN Meds: acetaminophen **OR** acetaminophen, labetalol, ondansetron **OR** ondansetron (ZOFRAN) IV, promethazine   Vital Signs    Vitals:   06/28/18 1508 06/28/18 2000 06/29/18 0400 06/29/18 0754  BP: 124/80 (!) 134/57  121/62  Pulse: 71   71  Resp: 20   15  Temp: 97.6 F (36.4 C) 97.6 F (36.4 C) 98.4 F (36.9 C) 98.3 F (36.8 C)  TempSrc: Oral Oral Oral Oral  SpO2: 99%   100%  Weight:      Height:        Intake/Output Summary (Last 24 hours) at 06/29/2018 1039 Last data filed at 06/29/2018 0754 Gross per 24 hour  Intake 1140 ml  Output 725 ml  Net 415 ml   Last 3 Weights 06/24/2018 06/24/2018 06/23/2018  Weight (lbs) 179 lb 14.3 oz 178 lb 2.1 oz 170 lb  Weight (kg) 81.6 kg 80.8 kg 77.111 kg      Telemetry    Normal sinus rhythm- Personally Reviewed  ECG    Not repeated- Personally Reviewed  Physical Exam  Sitting at bedside GEN: No acute distress.   Neck: No JVD Cardiac: RRR, no murmurs, rubs, or gallops.  Respiratory: Clear to auscultation bilaterally. GI: Soft, nontender, non-distended  MS: No edema; No deformity. Neuro:  Scalp bandages at sites of craniectomy. Psych: Normal affect   Labs    Chemistry Recent Labs  Lab 06/23/18 2153  06/27/18 0220 06/27/18 1218 06/28/18 0501 06/29/18 0413  NA  --    < > 135 138 137 135  K  --    < > 4.2 4.2 4.6 3.9  CL  --    < > 104 103 104 105  CO2  --    < > 20* 22 21* 21*  GLUCOSE  --    < > 128* 179* 135* 113*  BUN  --    < > 20 22  25* 23  CREATININE  --    < > 1.08 1.26* 1.23 1.03  CALCIUM  --    < > 9.2 9.3 9.2 8.5*  PROT 7.2  --   --   --   --   --   ALBUMIN 4.0  --  3.8  --   --   --   AST 26  --   --   --   --   --   ALT 17  --   --   --   --   --   ALKPHOS 92  --   --   --   --   --   BILITOT 1.5*  --   --   --   --   --   GFRNONAA  --    < > >60 53* 54* >60  GFRAA  --    < > >60 >60 >60 >60  ANIONGAP  --    < > 11 13 12 9    < > =  values in this interval not displayed.     Hematology Recent Labs  Lab 06/27/18 1218 06/28/18 0501 06/29/18 0413  WBC 9.3 10.2 7.3  RBC 5.01 4.50 4.05*  HGB 14.6 13.3 11.9*  HCT 43.5 39.2 35.3*  MCV 86.8 87.1 87.2  MCH 29.1 29.6 29.4  MCHC 33.6 33.9 33.7  RDW 12.1 11.9 12.1  PLT 161 165 137*    Cardiac Enzymes Recent Labs  Lab 06/23/18 2153  TROPONINI <0.03   No results for input(s): TROPIPOC in the last 168 hours.   BNPNo results for input(s): BNP, PROBNP in the last 168 hours.   DDimer No results for input(s): DDIMER in the last 168 hours.   Radiology    Ct Head Wo Contrast  Result Date: 06/28/2018 CLINICAL DATA:  Subdural hemorrhage follow-up EXAM: CT HEAD WITHOUT CONTRAST TECHNIQUE: Contiguous axial images were obtained from the base of the skull through the vertex without intravenous contrast. COMPARISON:  06/24/2018 FINDINGS: Brain: Status post bifrontal burr holes with right subdural drainage catheter. There is moderate bilateral pneumocephalus, left-greater-than-right. The size of the right subdural hematoma has markedly decreased, now measuring 14 mm in thickness, previously 22 mm. The left-sided hematoma now measures 17 mm in thickness, previously 20 mm in thickness. No new site of hemorrhage. At the level of the foramina Monro, rightward midline shift measures approximately 3 mm. Vascular: No abnormal hyperdensity of the major intracranial arteries or dural venous sinuses. No intracranial atherosclerosis. Skull: Bifrontal burr holes. Sinuses/Orbits: No  fluid levels or advanced mucosal thickening of the visualized paranasal sinuses. No mastoid or middle ear effusion. The orbits are normal. IMPRESSION: 1. Decreased size of bilateral subdural hematomas status post bifrontal burr holes with placement of right subdural drainage catheter. 2. Moderate bilateral pneumocephalus, left-greater-than-right. Electronically Signed   By: Deatra Robinson M.D.   On: 06/28/2018 01:04    Cardiac Studies   No new data  Patient Profile     84 y.o. male male with a hx of STEMI s/p PCI/DESx1 diag, residual disease in RCA, HTN, HL, RBBB, CKD who is being seen today for the evaluation of subdural hematoma on DAPT at the request of Dr. Wynetta Emery.  DAPT was discontinued.  He is status post bilateral craniectomy with bur.  Aspirin has been resumed.  Assessment & Plan    1. Status post evacuation of bilateral subdural hematomas.  Patient had been on dual antiplatelet therapy since acute intervention for myocardial infarction in February 2020.  Seems to be doing well neurologically. 2. Coronary artery disease, recent acute coronary syndrome, residual CAD, stent implantation 1 month ago.  I would recommend resuming clopidogrel 75 mg/day and drop aspirin when it is safe to start clopidogrel.  WHEN CLOPIDOGREL BE STARTED?       For questions or updates, please contact [facility] Please consult [email or web address] for contact info under        Signed, Lesleigh Noe, MD  06/29/2018, 10:39 AM

## 2018-06-29 NOTE — Progress Notes (Signed)
Occupational Therapy Treatment Patient Details Name: Craig [name] MRN: [id number] DOB: 03-Mar-1935 Today's Date: 06/29/2018    History of present illness Craig [name] is a 84 y.o. male with medical history significant of CAD had inferior wall STEMI on 05/30/2018, underwent PCI with DES to D1.  EF 45-50%.  Did have residual disease and ultimately was planned to do another PCI, this time to the 80% blockage of the RCA, this originally planned for today but was deferred as an elective procedure for 4 weeks due to the coronavirus pandemic. Larey Seat out of bed on Sunday.  Then had fall on Tuesday landing on his left side and arm, unsure if he hit his head.  He has been on Brilinta and ASA following the STEMI and PCI in Feb. Since those falls he has had progressive mental and functional decline for the past 2 days.  He was independent prior to this, now with slowed mental status, needing maximal assist with walking. Pt under went burrhole procedure on 3/23 for bilat SDH evacuation.   OT comments  Pt progressing towards established OT goals. Pt performing trail making task with three part directions. Pt requiring Min VCs for problem solving and memory during trail making task, but demonstrated increased memory to recall his room number. Administered Mini-Cog screen to assess memory, languange, visual-motor, and exective functioning. Pt scoring 1 out of 5 indicating meaningful cognitive deficits. Discussed dc recommendation, pt verbalizing he wants to go home but is agreeable to rehab to increase safety with ADLs before returning home. Continue to recommend dc to CIR and will continue to follow acutely as admitted.    Follow Up Recommendations  CIR    Equipment Recommendations  Other (comment)(TBD)    Recommendations for Other Services Rehab consult    Precautions / Restrictions Precautions Precautions: Fall Precaution Comments: recent burr hole surgery Restrictions Weight Bearing Restrictions:  No       Mobility Bed Mobility Overal bed mobility: Needs Assistance Bed Mobility: Supine to Sit     Supine to sit: Min guard     General bed mobility comments: min guard for safety as patient impulsively quick not paying attention to lines or blankets that he could get caught up on  Transfers Overall transfer level: Needs assistance Equipment used: None Transfers: Sit to/from Stand Sit to Stand: Min guard         General transfer comment: Min Guard A for safety    Balance Overall balance assessment: Needs assistance Sitting-balance support: No upper extremity supported Sitting balance-Leahy Scale: Fair     Standing balance support: No upper extremity supported Standing balance-Leahy Scale: Fair                             ADL either performed or assessed with clinical judgement   ADL Overall ADL's : Needs assistance/impaired                                       General ADL Comments: Focused session on cognition to challenge memory, problem solving, and awareness. Pt performing trail making task and completing Mini-Cog screening.      Vision       Perception     Praxis      Cognition Arousal/Alertness: Awake/alert Behavior During Therapy: Impulsive Overall Cognitive Status: Impaired/Different from baseline Area of Impairment: Orientation;Attention;Memory;Following commands;Safety/judgement;Problem solving  Orientation Level: Disoriented to;Time Current Attention Level: Sustained Memory: Decreased short-term memory Following Commands: Follows one step commands with increased time;Follows one step commands consistently;Follows multi-step commands with increased time Safety/Judgement: Decreased awareness of safety;Decreased awareness of deficits Awareness: Emergent Problem Solving: Difficulty sequencing;Requires verbal cues;Requires tactile cues General Comments: Administered Mini-Cog Screening to assess  memory, languange, visual-motor, and exective functioning. Pt scored a 1 out of 5 indicating meaningful cognitive impairment . Pt unable to recall ST memory words (version 1). Pt placing numbers in correct positions on clock, but inappropiately placed hands (both clock hands pointing to number 11).  Pt asking reassuring questions during test such as "do you want me to start at the 12?" Pt performing a trail making task to locate the exit. Provided him three step directions. Pt initially confused and required Min cues to realize confusion; pt then returning to his room to restart. Pt abel to recall his room number throughout task. During this session, pt demosntrating increased problem solving, awareness of deficits, and memory. However, continues to present with deficits impacting his safety with ADLs and IADLs.         Exercises     Shoulder Instructions       General Comments VSS    Pertinent Vitals/ Pain       Pain Assessment: No/denies pain  Home Living                                          Prior Functioning/Environment              Frequency  Min 3X/week        Progress Toward Goals  OT Goals(current goals can now be found in the care plan section)  Progress towards OT goals: Progressing toward goals  Acute Rehab OT Goals Patient Stated Goal: home today OT Goal Formulation: With patient Time For Goal Achievement: 07/08/18 Potential to Achieve Goals: Good ADL Goals Pt Will Perform Eating: with min assist;sitting Pt Will Perform Grooming: with min assist;sitting;standing Pt Will Perform Upper Body Bathing: with min assist;sitting;standing Pt Will Perform Lower Body Bathing: with min assist;sit to/from stand;sitting/lateral leans Pt Will Perform Upper Body Dressing: with min assist;sitting Pt Will Perform Lower Body Dressing: with min assist;sit to/from stand;sitting/lateral leans Pt Will Transfer to Toilet: with min assist;bedside  commode;ambulating Additional ADL Goal #1: Pt will complete BADL task with <3 VC's for successful completion  Plan Discharge plan remains appropriate    Co-evaluation                 AM-PAC OT "6 Clicks" Daily Activity     Outcome Measure   Help from another person eating meals?: A Little Help from another person taking care of personal grooming?: A Little Help from another person toileting, which includes using toliet, bedpan, or urinal?: A Little Help from another person bathing (including washing, rinsing, drying)?: A Lot Help from another person to put on and taking off regular upper body clothing?: A Little Help from another person to put on and taking off regular lower body clothing?: A Lot 6 Click Score: 16    End of Session Equipment Utilized During Treatment: Gait belt  OT Visit Diagnosis: Unsteadiness on feet (R26.81);Other abnormalities of gait and mobility (R26.89);History of falling (Z91.81);Muscle weakness (generalized) (M62.81);Other symptoms and signs involving the nervous system (R29.898);Other symptoms and signs involving cognitive function;Hemiplegia and hemiparesis Hemiplegia -  Right/Left: Left Hemiplegia - dominant/non-dominant: Non-Dominant Hemiplegia - caused by: (B SDHs)   Activity Tolerance Patient tolerated treatment well   Patient Left in bed;with call bell/phone within reach;with bed alarm set   Nurse Communication Mobility status        Time: [id number] OT Time Calculation (min): 32 min  Charges: OT General Charges $OT Visit: 1 Visit OT Treatments $Self Care/Home Management : 8-22 mins $Cognitive Funtion inital: Initial 15 mins  Kunal Levario MSOT, OTR/L Acute Rehab Pager: [phone] Office: [phone]   Theodoro GristCharis M [name] Tuft 06/29/2018, 4:41 PM

## 2018-06-29 NOTE — Progress Notes (Addendum)
PROGRESS NOTE  Craig [name] VFM:[id number] DOB: 01/18/1935 DOA: 06/23/2018 PCP: Practice, [facility]  HPI/Recap of past 24 hours: Brief Narrative:  84yo on Brilinta and ASA w/ a hx of CAD s/p inferior wall STEMI on 05/30/2018 > PCI with DES to D1 w/ residual disease and plan for eventual PCI to an 80% RCA lesion (delayed due to current pandemic), who fell out of bed. Two days later he fell again, landing on his left side and arm. Following these falls he was noted to exhibit progressive mental and functional decline. In the ED a CT head noted large B SDHs.   Significant Events: 3/20 admit 3/23 OR > B bur hole craniectomy for evacuation of B subacute subdural hematomas  Subjective: Patient seen and examined his bedside.  No acute events overnight.  He wants to go home.  He is alert and oriented x2.  Restarted on aspirin on 06/28/2018 per neurosurgery.  Denies headache.  PT assessed and recommended inpatient rehab.  We will continue therapies as recommended by neurosurgery.   Assessment/Plan: Principal Problem:   Bilateral subdural hematomas (HCC) Active Problems:   Status post coronary artery stent placement   Subdural hematoma (HCC)   SDH (subdural hematoma) (HCC)   Stage 3 chronic kidney disease (HCC)   Benign essential HTN   Cognitive deficit as late effect of traumatic brain injury (HCC)   Traumatic brain injury with loss of consciousness (HCC)   Coronary artery disease involving native artery of transplanted heart without angina pectoris  Large bilateral subdural hematoma POD #2 post bur hole craniectomy for evacuation on 06/27/2018 by neurosurgery  On aspirin and Brilinta prior to presentation for status post recent PCI with stent placement Independently reviewed CT head with no contrast done on 06/23/2018, 06/24/2018 and 06/28/2018 Improvement noted in latest CT head Neurosurgery following Restarted on aspirin on 06/28/2018 per neurosurgery Continue to closely  monitor mental status and symptomatology Continue IV Keppra  Acute blood loss anemia post bur hole craniectomy Presented with hemoglobin of 13.3 Hemoglobin 11.9 on 06/29/2018 Continue to monitor H&H No obvious sign of bleeding at this time Repeat CBC in the morning  Coronary artery disease status post recent PCI with stenting Aspirin has been resumed Plan is to substitute Plavix for Brilinta Cardiology following  Physical debility/ambulatory dysfunction PT assessed and recommended inpatient rehab Fall precautions  Ischemic cardiomyopathy with EF of 40 to 45% Continue strict I's and O's and daily weight Continue current medications  GERD Continue Protonix   DVT prophylaxis: SCDs Code Status: FULL CODE Family Communication:  Disposition Plan:  Possible inpatient rehab if patient is agreeable when cardiology and neurosurgery signed off.  Consultants:  [facility]   Antimicrobials:  none     Objective: Vitals:   06/28/18 1508 06/28/18 2000 06/29/18 0400 06/29/18 0754  BP: 124/80 (!) 134/57  121/62  Pulse: 71   71  Resp: 20   15  Temp: 97.6 F (36.4 C) 97.6 F (36.4 C) 98.4 F (36.9 C) 98.3 F (36.8 C)  TempSrc: Oral Oral Oral Oral  SpO2: 99%   100%  Weight:      Height:        Intake/Output Summary (Last 24 hours) at 06/29/2018 1037 Last data filed at 06/29/2018 0754 Gross per 24 hour  Intake 1140 ml  Output 725 ml  Net 415 ml   Filed Weights   06/23/18 1939 06/24/18 0218 06/24/18 0739  Weight: 77.1 kg 80.8 kg 81.6 kg    Exam:  .  General: 84 y.o. year-old male well developed well nourished in no acute distress.  Alert and interactive.  Oriented x2. . Cardiovascular: Regular rate and rhythm with no rubs or gallops.  No thyromegaly or JVD noted.   Marland Kitchen Respiratory: Clear to auscultation with no wheezes or rales. Good inspiratory effort. . Abdomen: Soft nontender nondistended with normal bowel sounds x4 quadrants. . Psychiatry: Mood is appropriate  for condition and setting   Data Reviewed: CBC: Recent Labs  Lab 06/24/18 0553 06/27/18 0220 06/27/18 1218 06/28/18 0501 06/29/18 0413  WBC 6.7 7.4 9.3 10.2 7.3  NEUTROABS  --  4.6  --   --   --   HGB 12.6* 15.1 14.6 13.3 11.9*  HCT 37.2* 42.9 43.5 39.2 35.3*  MCV 86.7 86.3 86.8 87.1 87.2  PLT 132* 169 161 165 137*   Basic Metabolic Panel: Recent Labs  Lab 06/24/18 0553 06/27/18 0220 06/27/18 1218 06/28/18 0501 06/29/18 0413  NA 138 135 138 137 135  K 3.6 4.2 4.2 4.6 3.9  CL 107 104 103 104 105  CO2 21* 20* 22 21* 21*  GLUCOSE 125* 128* 179* 135* 113*  BUN 18 20 22  25* 23  CREATININE 1.16 1.08 1.26* 1.23 1.03  CALCIUM 8.9 9.2 9.3 9.2 8.5*  MG  --  2.1  --  2.1 2.0  PHOS  --  3.3  --   --   --    GFR: Estimated Creatinine Clearance: 57.1 mL/min (by C-G formula based on SCr of 1.03 mg/dL). Liver Function Tests: Recent Labs  Lab 06/23/18 2153 06/27/18 0220  AST 26  --   ALT 17  --   ALKPHOS 92  --   BILITOT 1.5*  --   PROT 7.2  --   ALBUMIN 4.0 3.8   No results for input(s): LIPASE, AMYLASE in the last 168 hours. No results for input(s): AMMONIA in the last 168 hours. Coagulation Profile: Recent Labs  Lab 06/23/18 0100  INR 1.0   Cardiac Enzymes: Recent Labs  Lab 06/23/18 2153  CKTOTAL 116  TROPONINI <0.03   BNP (last 3 results) No results for input(s): PROBNP in the last 8760 hours. HbA1C: No results for input(s): HGBA1C in the last 72 hours. CBG: Recent Labs  Lab 06/23/18 2348  GLUCAP 95   Lipid Profile: No results for input(s): CHOL, HDL, LDLCALC, TRIG, CHOLHDL, LDLDIRECT in the last 72 hours. Thyroid Function Tests: No results for input(s): TSH, T4TOTAL, FREET4, T3FREE, THYROIDAB in the last 72 hours. Anemia Panel: No results for input(s): VITAMINB12, FOLATE, FERRITIN, TIBC, IRON, RETICCTPCT in the last 72 hours. Urine analysis:    Component Value Date/Time   COLORURINE YELLOW 06/24/2018 0011   APPEARANCEUR CLEAR 06/24/2018 0011    LABSPEC 1.023 06/24/2018 0011   PHURINE 5.0 06/24/2018 0011   GLUCOSEU NEGATIVE 06/24/2018 0011   HGBUR NEGATIVE 06/24/2018 0011   BILIRUBINUR NEGATIVE 06/24/2018 0011   KETONESUR NEGATIVE 06/24/2018 0011   PROTEINUR NEGATIVE 06/24/2018 0011   NITRITE NEGATIVE 06/24/2018 0011   LEUKOCYTESUR NEGATIVE 06/24/2018 0011   Sepsis Labs: @LABRCNTIP (procalcitonin:4,lacticidven:4)  ) Recent Results (from the past 240 hour(s))  MRSA PCR Screening     Status: None   Collection Time: 06/27/18 11:20 AM  Result Value Ref Range Status   MRSA by PCR NEGATIVE NEGATIVE Final    Comment:        The GeneXpert MRSA Assay (FDA approved for NASAL specimens only), is one component of a comprehensive MRSA colonization surveillance program. It is not intended  to diagnose MRSA infection nor to guide or monitor treatment for MRSA infections. Performed at [facility], [address] [place], [place] [address]       Studies: No results found.  Scheduled Meds: . aspirin EC  81 mg Oral Daily  . docusate sodium  100 mg Oral BID  . pantoprazole (PROTONIX) IV  40 mg Intravenous QHS    Continuous Infusions: . 0.9 % NaCl with KCl 20 mEq / L 50 mL/hr at 06/28/18 2159  . levETIRAcetam 500 mg (06/29/18 1018)     LOS: 5 days     Darlin Drop, MD [facility] Pager [phone]  If 7PM-7AM, please contact night-coverage [email or web address] Password [facility] 06/29/2018, 10:37 AM

## 2018-06-29 NOTE — Progress Notes (Signed)
Subjective: Patient reports doing well, denies any headaches. Doing well with therapies  Objective: Vital signs in last 24 hours: Temp:  [97.6 F (36.4 C)-98.4 F (36.9 C)] 98.3 F (36.8 C) (03/25 0754) Pulse Rate:  [64-80] 71 (03/25 0754) Resp:  [13-21] 15 (03/25 0754) BP: (111-134)/(57-80) 121/62 (03/25 0754) SpO2:  [99 %-100 %] 100 % (03/25 0754)  Intake/Output from previous day: 03/24 0701 - 03/25 0700 In: 1440 [P.O.:240; I.V.:1200] Out: 750 [Urine:750] Intake/Output this shift: Total I/O In: -  Out: 200 [Urine:200]  Neurologic: Grossly normal, patient awake and alert but confused at times.   Lab Results: Lab Results  Component Value Date   WBC 7.3 06/29/2018   HGB 11.9 (L) 06/29/2018   HCT 35.3 (L) 06/29/2018   MCV 87.2 06/29/2018   PLT 137 (L) 06/29/2018   Lab Results  Component Value Date   INR 1.0 06/23/2018   BMET Lab Results  Component Value Date   NA 135 06/29/2018   K 3.9 06/29/2018   CL 105 06/29/2018   CO2 21 (L) 06/29/2018   GLUCOSE 113 (H) 06/29/2018   BUN 23 06/29/2018   CREATININE 1.03 06/29/2018   CALCIUM 8.5 (L) 06/29/2018    Studies/Results: Ct Head Wo Contrast  Result Date: 06/28/2018 CLINICAL DATA:  Subdural hemorrhage follow-up EXAM: CT HEAD WITHOUT CONTRAST TECHNIQUE: Contiguous axial images were obtained from the base of the skull through the vertex without intravenous contrast. COMPARISON:  06/24/2018 FINDINGS: Brain: Status post bifrontal burr holes with right subdural drainage catheter. There is moderate bilateral pneumocephalus, left-greater-than-right. The size of the right subdural hematoma has markedly decreased, now measuring 14 mm in thickness, previously 22 mm. The left-sided hematoma now measures 17 mm in thickness, previously 20 mm in thickness. No new site of hemorrhage. At the level of the foramina Monro, rightward midline shift measures approximately 3 mm. Vascular: No abnormal hyperdensity of the major intracranial  arteries or dural venous sinuses. No intracranial atherosclerosis. Skull: Bifrontal burr holes. Sinuses/Orbits: No fluid levels or advanced mucosal thickening of the visualized paranasal sinuses. No mastoid or middle ear effusion. The orbits are normal. IMPRESSION: 1. Decreased size of bilateral subdural hematomas status post bifrontal burr holes with placement of right subdural drainage catheter. 2. Moderate bilateral pneumocephalus, left-greater-than-right. Electronically Signed   By: Deatra Robinson M.D.   On: 06/28/2018 01:04    Assessment/Plan: Postop day 2 from bilateral burr holes for evacuation of hematoma. Plan is for CIR. Continue therapies today.    LOS: 5 days    Tiana Loft [facility] 06/29/2018, 10:32 AM

## 2018-06-29 NOTE — Progress Notes (Signed)
Inpatient Rehab Admissions Coordinator:   I spoke with pt's wife and daughter over this phone, and met with the pt at the bedside.  Wife/daughter agreeable to possible rehab admission; pt would prefer to go home.  Discussed goals and expectations of a possible rehab stay with all parties, and they would like to confer amongst themselves to make a decision.  I will plan to follow up tomorrow morning for decision.    Shann Medal, PT, DPT Admissions Coordinator [phone] 06/29/18  2:22 PM

## 2018-06-29 NOTE — Progress Notes (Signed)
Physical Therapy Treatment Patient Details Name: Craig [name] MRN: [id number] DOB: [birth year] Today's Date: 06/29/2018    History of Present Illness Craig [name] is a 84 y.o. male with medical history significant of CAD had inferior wall STEMI on 05/30/2018, underwent PCI with DES to D1.  EF 45-50%.  Did have residual disease and ultimately was planned to do another PCI, this time to the 80% blockage of the RCA, this originally planned for today but was deferred as an elective procedure for 4 weeks due to the coronavirus pandemic. Larey Seat out of bed on Sunday.  Then had fall on Tuesday landing on his left side and arm, unsure if he hit his head.  He has been on Brilinta and ASA following the STEMI and PCI in Feb. Since those falls he has had progressive mental and functional decline for the past 2 days.  He was independent prior to this, now with slowed mental status, needing maximal assist with walking. Pt under went burrhole procedure on 3/23 for bilat SDH evacuation.    PT Comments    Pt improved from yesterday however remains mildly confused amongst other cognitive deficits mentioned below and continues to have impaired balance and is at an increased falls risk. Cont to benefit from CIR upon d/c to address mentioned deficits as pt was indep PTA. Acute PT to cont to follow.    Follow Up Recommendations  CIR     Equipment Recommendations  None recommended by PT    Recommendations for Other Services       Precautions / Restrictions Precautions Precautions: Fall Precaution Comments: recent burr hole surgery Restrictions Weight Bearing Restrictions: No    Mobility  Bed Mobility Overal bed mobility: Needs Assistance Bed Mobility: Supine to Sit     Supine to sit: Min guard     General bed mobility comments: min guard for safety as patient impulsively quick not paying attention to lines or blankets that he could get caught up on  Transfers Overall transfer level: Needs  assistance Equipment used: None Transfers: Sit to/from Stand Sit to Stand: Min assist         General transfer comment: pt con't to be impulsive, minA for sit to stand, verbal cues for hand placement  Ambulation/Gait Ambulation/Gait assistance: Min assist Gait Distance (Feet): 200 Feet Assistive device: None Gait Pattern/deviations: Step-through pattern;Decreased stride length Gait velocity: slow Gait velocity interpretation: 1.31 - 2.62 ft/sec, indicative of limited community ambulator General Gait Details: pt safer without RW than with. Pt cont to be easily distracted causing episode of mild instability, max directional verbal cues for halway navigation however was able to find room with minimal cuing   Stairs Stairs: Yes Stairs assistance: Min assist Stair Management: Step to pattern(HHA for handrail as patient with no HR at home) Number of Stairs: 2 General stair comments: minA for safety due to lines   Wheelchair Mobility    Modified Rankin (Stroke Patients Only)       Balance Overall balance assessment: Needs assistance Sitting-balance support: No upper extremity supported Sitting balance-Leahy Scale: Fair     Standing balance support: No upper extremity supported Standing balance-Leahy Scale: Fair                              Cognition Arousal/Alertness: Awake/alert Behavior During Therapy: Impulsive Overall Cognitive Status: Impaired/Different from baseline Area of Impairment: Orientation;Attention;Memory;Following commands;Safety/judgement;Problem solving  Orientation Level: (initially pt disoriented, once re-oriented pt able to recall) Current Attention Level: Sustained Memory: Decreased short-term memory Following Commands: Follows one step commands with increased time;Follows one step commands consistently;Follows multi-step commands with increased time Safety/Judgement: Decreased awareness of safety;Decreased  awareness of deficits Awareness: Emergent Problem Solving: Difficulty sequencing;Requires verbal cues;Requires tactile cues General Comments: pt improved form yesterday however remains to have decreased insight to safety and deficits. Pt was unaware he was in the hospital initially however once re-oriented pt able to recall, time, place, situation t/o session.       Exercises      General Comments General comments (skin integrity, edema, etc.): dressings in place on head. Pt able to sequence on how to get a snack from the vending machine      Pertinent Vitals/Pain Pain Assessment: No/denies pain    Home Living                      Prior Function            PT Goals (current goals can now be found in the care plan section) Acute Rehab PT Goals Patient Stated Goal: home today Progress towards PT goals: Progressing toward goals    Frequency    Min 4X/week      PT Plan Current plan remains appropriate    Co-evaluation              AM-PAC PT "6 Clicks" Mobility   Outcome Measure  Help needed turning from your back to your side while in a flat bed without using bedrails?: A Little Help needed moving from lying on your back to sitting on the side of a flat bed without using bedrails?: A Little Help needed moving to and from a bed to a chair (including a wheelchair)?: A Little Help needed standing up from a chair using your arms (e.g., wheelchair or bedside chair)?: A Little Help needed to walk in hospital room?: A Little Help needed climbing 3-5 steps with a railing? : A Little 6 Click Score: 18    End of Session Equipment Utilized During Treatment: Gait belt Activity Tolerance: Patient tolerated treatment well Patient left: in chair;with call bell/phone within reach;with chair alarm set Nurse Communication: Mobility status PT Visit Diagnosis: Unsteadiness on feet (R26.81);Other abnormalities of gait and mobility (R26.89);History of falling  (Z91.81);Muscle weakness (generalized) (M62.81);Difficulty in walking, not elsewhere classified (R26.2);Other symptoms and signs involving the nervous system (R29.898)     Time: 4536-4680 PT Time Calculation (min) (ACUTE ONLY): 31 min  Charges:  $Gait Training: 23-37 mins                     Lewis Shock, PT, DPT Acute Rehabilitation Services Pager #: [phone] Office #: [phone]    Iona Hansen 06/29/2018, 11:01 AM

## 2018-06-30 ENCOUNTER — Inpatient Hospital Stay (HOSPITAL_COMMUNITY): Payer: Medicare Other

## 2018-06-30 ENCOUNTER — Encounter (HOSPITAL_COMMUNITY): Payer: Self-pay | Admitting: Nurse Practitioner

## 2018-06-30 ENCOUNTER — Inpatient Hospital Stay (HOSPITAL_COMMUNITY)
Admission: RE | Admit: 2018-06-30 | Discharge: 2018-07-07 | DRG: 945 | Disposition: A | Discharge status: Home Health | Payer: Medicare Other | Source: Intra-hospital | Attending: Physical Medicine & Rehabilitation | Admitting: Physical Medicine & Rehabilitation

## 2018-06-30 DIAGNOSIS — Z955 Presence of coronary angioplasty implant and graft: Secondary | ICD-10-CM | POA: Diagnosis not present

## 2018-06-30 DIAGNOSIS — W19XXXD Unspecified fall, subsequent encounter: Secondary | ICD-10-CM | POA: Diagnosis present

## 2018-06-30 DIAGNOSIS — D62 Acute posthemorrhagic anemia: Secondary | ICD-10-CM | POA: Diagnosis present

## 2018-06-30 DIAGNOSIS — N183 Chronic kidney disease, stage 3 (moderate): Secondary | ICD-10-CM | POA: Diagnosis not present

## 2018-06-30 DIAGNOSIS — R0602 Shortness of breath: Secondary | ICD-10-CM

## 2018-06-30 DIAGNOSIS — I255 Ischemic cardiomyopathy: Secondary | ICD-10-CM | POA: Diagnosis present

## 2018-06-30 DIAGNOSIS — S069X0S Unspecified intracranial injury without loss of consciousness, sequela: Secondary | ICD-10-CM | POA: Diagnosis not present

## 2018-06-30 DIAGNOSIS — R7303 Prediabetes: Secondary | ICD-10-CM | POA: Diagnosis not present

## 2018-06-30 DIAGNOSIS — F068 Other specified mental disorders due to known physiological condition: Secondary | ICD-10-CM | POA: Diagnosis not present

## 2018-06-30 DIAGNOSIS — I252 Old myocardial infarction: Secondary | ICD-10-CM

## 2018-06-30 DIAGNOSIS — I10 Essential (primary) hypertension: Secondary | ICD-10-CM | POA: Diagnosis present

## 2018-06-30 DIAGNOSIS — R5381 Other malaise: Secondary | ICD-10-CM | POA: Diagnosis not present

## 2018-06-30 DIAGNOSIS — R0902 Hypoxemia: Secondary | ICD-10-CM | POA: Diagnosis not present

## 2018-06-30 DIAGNOSIS — S069X9A Unspecified intracranial injury with loss of consciousness of unspecified duration, initial encounter: Secondary | ICD-10-CM | POA: Diagnosis present

## 2018-06-30 DIAGNOSIS — R296 Repeated falls: Secondary | ICD-10-CM | POA: Diagnosis present

## 2018-06-30 DIAGNOSIS — R609 Edema, unspecified: Secondary | ICD-10-CM | POA: Diagnosis not present

## 2018-06-30 DIAGNOSIS — I2510 Atherosclerotic heart disease of native coronary artery without angina pectoris: Secondary | ICD-10-CM | POA: Diagnosis present

## 2018-06-30 DIAGNOSIS — S065X9D Traumatic subdural hemorrhage with loss of consciousness of unspecified duration, subsequent encounter: Secondary | ICD-10-CM | POA: Diagnosis not present

## 2018-06-30 DIAGNOSIS — I129 Hypertensive chronic kidney disease with stage 1 through stage 4 chronic kidney disease, or unspecified chronic kidney disease: Secondary | ICD-10-CM | POA: Diagnosis not present

## 2018-06-30 DIAGNOSIS — S065X9A Traumatic subdural hemorrhage with loss of consciousness of unspecified duration, initial encounter: Secondary | ICD-10-CM | POA: Diagnosis not present

## 2018-06-30 DIAGNOSIS — R739 Hyperglycemia, unspecified: Secondary | ICD-10-CM | POA: Diagnosis not present

## 2018-06-30 DIAGNOSIS — S065XAA Traumatic subdural hemorrhage with loss of consciousness status unknown, initial encounter: Secondary | ICD-10-CM

## 2018-06-30 DIAGNOSIS — S069X9S Unspecified intracranial injury with loss of consciousness of unspecified duration, sequela: Secondary | ICD-10-CM | POA: Diagnosis not present

## 2018-06-30 LAB — CBC
HCT: 33.8 % — ABNORMAL LOW (ref 39.0–52.0)
Hemoglobin: 11.4 g/dL — ABNORMAL LOW (ref 13.0–17.0)
MCH: 29.2 pg (ref 26.0–34.0)
MCHC: 33.7 g/dL (ref 30.0–36.0)
MCV: 86.7 fL (ref 80.0–100.0)
Platelets: 135 10*3/uL — ABNORMAL LOW (ref 150–400)
RBC: 3.9 MIL/uL — ABNORMAL LOW (ref 4.22–5.81)
RDW: 12 % (ref 11.5–15.5)
WBC: 7.2 10*3/uL (ref 4.0–10.5)
nRBC: 0 % (ref 0.0–0.2)

## 2018-06-30 LAB — GLUCOSE, CAPILLARY
Glucose-Capillary: 108 mg/dL — ABNORMAL HIGH (ref 70–99)
Glucose-Capillary: 116 mg/dL — ABNORMAL HIGH (ref 70–99)

## 2018-06-30 MED ORDER — LEVETIRACETAM 500 MG PO TABS
500.0000 mg | ORAL_TABLET | Freq: Two times a day (BID) | ORAL | Status: DC
  Administered 2018-06-30 – 2018-07-07 (×14): 500 mg via ORAL
  Filled 2018-06-30 (×14): qty 1

## 2018-06-30 MED ORDER — PROCHLORPERAZINE MALEATE 5 MG PO TABS: 5.0000 mg | ORAL_TABLET | Freq: Four times a day (QID) | ORAL | Status: DC | PRN

## 2018-06-30 MED ORDER — FLEET ENEMA 7-19 GM/118ML RE ENEM: 1.0000 | ENEMA | Freq: Once | RECTAL | Status: DC | PRN

## 2018-06-30 MED ORDER — DOCUSATE SODIUM 100 MG PO CAPS: 100.0000 mg | ORAL_CAPSULE | Freq: Two times a day (BID) | ORAL | 0 refills | Status: DC

## 2018-06-30 MED ORDER — PANTOPRAZOLE SODIUM 40 MG PO TBEC
40.0000 mg | DELAYED_RELEASE_TABLET | Freq: Every day | ORAL | Status: DC
  Administered 2018-06-30 – 2018-07-06 (×7): 40 mg via ORAL
  Filled 2018-06-30 (×7): qty 1

## 2018-06-30 MED ORDER — POLYETHYLENE GLYCOL 3350 17 G PO PACK: 17.0000 g | PACK | Freq: Every day | ORAL | Status: DC | PRN

## 2018-06-30 MED ORDER — LEVETIRACETAM 500 MG PO TABS: 500.0000 mg | ORAL_TABLET | Freq: Two times a day (BID) | ORAL | 0 refills | Status: DC

## 2018-06-30 MED ORDER — ASPIRIN 81 MG PO TBEC: 81.0000 mg | DELAYED_RELEASE_TABLET | Freq: Every day | ORAL | 0 refills | Status: DC

## 2018-06-30 MED ORDER — NICOTINE 7 MG/24HR TD PT24
7.0000 mg | MEDICATED_PATCH | Freq: Every day | TRANSDERMAL | Status: DC
  Administered 2018-07-01 – 2018-07-05 (×5): 7 mg via TRANSDERMAL
  Filled 2018-06-30 (×6): qty 1

## 2018-06-30 MED ORDER — ASPIRIN EC 81 MG PO TBEC
81.0000 mg | DELAYED_RELEASE_TABLET | Freq: Every day | ORAL | Status: DC
  Administered 2018-07-01 – 2018-07-07 (×7): 81 mg via ORAL
  Filled 2018-06-30 (×7): qty 1

## 2018-06-30 MED ORDER — PROCHLORPERAZINE 25 MG RE SUPP: 12.5000 mg | Freq: Four times a day (QID) | RECTAL | Status: DC | PRN

## 2018-06-30 MED ORDER — ALUM & MAG HYDROXIDE-SIMETH 200-200-20 MG/5ML PO SUSP: 30.0000 mL | ORAL | Status: DC | PRN

## 2018-06-30 MED ORDER — PROCHLORPERAZINE EDISYLATE 10 MG/2ML IJ SOLN: 5.0000 mg | Freq: Four times a day (QID) | INTRAMUSCULAR | Status: DC | PRN

## 2018-06-30 MED ORDER — DIPHENHYDRAMINE HCL 12.5 MG/5ML PO ELIX: 12.5000 mg | ORAL_SOLUTION | Freq: Four times a day (QID) | ORAL | Status: DC | PRN

## 2018-06-30 MED ORDER — PANTOPRAZOLE SODIUM 40 MG PO TBEC: 40.0000 mg | DELAYED_RELEASE_TABLET | Freq: Every day | ORAL | 0 refills | Status: DC

## 2018-06-30 MED ORDER — DOCUSATE SODIUM 100 MG PO CAPS
100.0000 mg | ORAL_CAPSULE | Freq: Two times a day (BID) | ORAL | Status: DC
  Administered 2018-06-30 – 2018-07-06 (×13): 100 mg via ORAL
  Filled 2018-06-30 (×14): qty 1

## 2018-06-30 MED ORDER — ACETAMINOPHEN 325 MG PO TABS
325.0000 mg | ORAL_TABLET | ORAL | Status: DC | PRN
  Administered 2018-07-01: 650 mg via ORAL
  Filled 2018-06-30: qty 2

## 2018-06-30 MED ORDER — GUAIFENESIN-DM 100-10 MG/5ML PO SYRP: 5.0000 mL | ORAL_SOLUTION | Freq: Four times a day (QID) | ORAL | Status: DC | PRN

## 2018-06-30 MED ORDER — BISACODYL 10 MG RE SUPP: 10.0000 mg | Freq: Every day | RECTAL | Status: DC | PRN

## 2018-06-30 MED ORDER — TRAZODONE HCL 50 MG PO TABS
25.0000 mg | ORAL_TABLET | Freq: Every evening | ORAL | Status: DC | PRN
  Administered 2018-06-30: 50 mg via ORAL
  Filled 2018-06-30 (×2): qty 1

## 2018-06-30 NOTE — Progress Notes (Signed)
Patient ID: Craig [name], male   DOB: 1934-08-16, 84 y.o.   MRN: [id number] Patient admitted to [phone] via wheelchair, escorted by nursing staff.  Patient oriented to unit, specifically to fall prevention policy, visitation policy (none for now due to COVID), personal belongings policy.  Appears to be in no immediate distress at this time.  Dani Gobble, RN

## 2018-06-30 NOTE — Progress Notes (Addendum)
Occupational Therapy Treatment Patient Details Name: Craig [name] MRN: [id number] DOB: [birth year] Today's Date: 06/30/2018    History of present illness Craig [name] is a 84 y.o. male with medical history significant of CAD had inferior wall STEMI on 05/30/2018, underwent PCI with DES to D1.  EF 45-50%.  Did have residual disease and ultimately was planned to do another PCI, this time to the 80% blockage of the RCA, this originally planned for today but was deferred as an elective procedure for 4 weeks due to the coronavirus pandemic. Larey Seat out of bed on Sunday.  Then had fall on Tuesday landing on his left side and arm, unsure if he hit his head.  He has been on Brilinta and ASA following the STEMI and PCI in Feb. Since those falls he has had progressive mental and functional decline for the past 2 days.  He was independent prior to this, now with slowed mental status, needing maximal assist with walking. Pt under went burrhole procedure on 3/23 for bilat SDH evacuation.   OT comments  Pt presents supine in bed pleasant and willing to participate in therapy session. Pt requiring minA for functional mobility without AD within room. Performing toileting and x2 standing grooming ADL tasks with overall minA. Pt with continued cognitive impairments including decreased STM and unable to recall list of ADL tasks instructed to perform once entering bathroom. Feel CIR recommendation remains appropriate at this time. Will continue to follow acutely.    Follow Up Recommendations  CIR    Equipment Recommendations  Other (comment)(TBD)    Recommendations for Other Services Rehab consult    Precautions / Restrictions Precautions Precautions: Fall Precaution Comments: recent burr hole surgery Restrictions Weight Bearing Restrictions: No       Mobility Bed Mobility Overal bed mobility: Needs Assistance Bed Mobility: Supine to Sit;Sit to Supine     Supine to sit: Min assist Sit to  supine: Min guard   General bed mobility comments: minA for trunk elevation when rising to sitting  Transfers Overall transfer level: Needs assistance Equipment used: None Transfers: Sit to/from Stand Sit to Stand: Min guard         General transfer comment: Min Guard A for safety    Balance Overall balance assessment: Needs assistance Sitting-balance support: No upper extremity supported Sitting balance-Leahy Scale: Fair     Standing balance support: No upper extremity supported Standing balance-Leahy Scale: Fair                             ADL either performed or assessed with clinical judgement   ADL Overall ADL's : Needs assistance/impaired     Grooming: Min guard;Minimal assistance;Oral care;Wash/dry hands Grooming Details (indicate cue type and reason): close mingaurd-minA                 Toilet Transfer: Minimal assistance;Ambulation;Regular Toilet   Toileting- Clothing Manipulation and Hygiene: Minimal assistance;Sit to/from stand Toileting - Clothing Manipulation Details (indicate cue type and reason): for voiding bladder seated on toilet; minA for gown management     Functional mobility during ADLs: Minimal assistance       Vision       Perception     Praxis      Cognition Arousal/Alertness: Awake/alert Behavior During Therapy: Impulsive Overall Cognitive Status: Impaired/Different from baseline Area of Impairment: Orientation;Attention;Memory;Following commands;Safety/judgement;Problem solving  Orientation Level: Disoriented to;Time Current Attention Level: Sustained Memory: Decreased short-term memory Following Commands: Follows one step commands with increased time;Follows one step commands consistently;Follows multi-step commands with increased time Safety/Judgement: Decreased awareness of safety;Decreased awareness of deficits Awareness: Emergent Problem Solving: Difficulty sequencing;Requires  verbal cues;Requires tactile cues General Comments: pt with decreased STM noted this session, while seated EOB provided pt two ADL tasks to perform standing at sink, after entering bathroom pt could not recall tasks requested         Exercises     Shoulder Instructions       General Comments HR up to 120 with activity    Pertinent Vitals/ Pain       Pain Assessment: No/denies pain  Home Living                                          Prior Functioning/Environment              Frequency  Min 3X/week        Progress Toward Goals  OT Goals(current goals can now be found in the care plan section)  Progress towards OT goals: Progressing toward goals  Acute Rehab OT Goals Patient Stated Goal: home today OT Goal Formulation: With patient Time For Goal Achievement: 07/08/18 Potential to Achieve Goals: Good ADL Goals Pt Will Perform Eating: with min assist;sitting Pt Will Perform Grooming: with min assist;sitting;standing Pt Will Perform Upper Body Bathing: with min assist;sitting;standing Pt Will Perform Lower Body Bathing: with min assist;sit to/from stand;sitting/lateral leans Pt Will Perform Upper Body Dressing: with min assist;sitting Pt Will Perform Lower Body Dressing: with min assist;sit to/from stand;sitting/lateral leans Pt Will Transfer to Toilet: with min assist;bedside commode;ambulating Additional ADL Goal #1: Pt will complete BADL task with <3 VC's for successful completion  Plan Discharge plan remains appropriate    Co-evaluation                 AM-PAC OT "6 Clicks" Daily Activity     Outcome Measure   Help from another person eating meals?: A Little Help from another person taking care of personal grooming?: A Little Help from another person toileting, which includes using toliet, bedpan, or urinal?: A Little Help from another person bathing (including washing, rinsing, drying)?: A Lot Help from another person to put on  and taking off regular upper body clothing?: A Little Help from another person to put on and taking off regular lower body clothing?: A Lot 6 Click Score: 16    End of Session Equipment Utilized During Treatment: Gait belt  OT Visit Diagnosis: Unsteadiness on feet (R26.81);Other abnormalities of gait and mobility (R26.89);History of falling (Z91.81);Muscle weakness (generalized) (M62.81);Other symptoms and signs involving the nervous system (R29.898);Other symptoms and signs involving cognitive function;Hemiplegia and hemiparesis Hemiplegia - Right/Left: Left Hemiplegia - dominant/non-dominant: Non-Dominant Hemiplegia - caused by: (B SDHs)   Activity Tolerance Patient tolerated treatment well   Patient Left in bed;with call bell/phone within reach;with bed alarm set   Nurse Communication Mobility status        Time: 9311-2162 OT Time Calculation (min): 24 min  Charges: OT General Charges $OT Visit: 1 Visit OT Treatments $Self Care/Home Management : 23-37 mins  Marcy Siren, OT Supplemental Rehabilitation Services Pager [phone] Office [phone]    Orlando Penner 06/30/2018, 4:34 PM

## 2018-06-30 NOTE — Progress Notes (Signed)
Michel Santee, PT  Rehab Admission Coordinator  Physical Medicine and Rehabilitation  PMR Pre-admission  Signed  Date of Service:  06/30/2018 12:35 PM       Related encounter: ED to Hosp-Admission (Current) from 06/23/2018 in [place]         Show:Clear all [x] Manual[x] Template[x] Copied  Added by: [x] Michel Santee, PT  [] Hover for details PMR Admission Coordinator Pre-Admission Assessment  Patient: OMERO KOWAL is an 84 y.o., male MRN: [id number] DOB: 1935-01-25 Height: 5' 10"  (177.8 cm) Weight: 81.6 kg                                                                                                                                                  Insurance Information HMO: yes    PPO:      PCP:      IPA:      80/20:      OTHER:  PRIMARY: [employer] Medicare      Policy#: [id number]      Subscriber: patient CM Name: Hershal Coria      Phone#: [phone]     Fax#:  Pre-Cert#: [id number] with updates due to Dorthula Nettles on 07/06/2018 (phone [phone], fax [phone]) Employer:  Benefits:  Phone #: [phone]     Name:  Irene Shipper. Date: 04/06/2018     Deduct: $0      Out of Pocket Max: $4,500 (met $672.17) Life Max:  n/a CIR: $325/day days 1-5, $0/day remaining days      SNF: $0/day days 1-20, $160/day days 21-49, $0/day days 50-100 Outpatient:      Co-Pay: $35/visit Home Health: 100%      Co-Pay:  DME: 80%     Co-Pay: 20% Providers: patient choice    Medicaid Application Date:       Case Manager:  Disability Application Date:       Case Worker:   Emergency Contact Information         Contact Information    Name Relation Home Work Mobile   Phelps,Jennifer Daughter [phone]  [phone]   Codi, Kertz Daughter [phone]  [phone]   Arvle, Grabe [phone]  [phone]     Current Medical History  Patient Admitting Diagnosis: bilateral SDH with burr hole evacuation History of Present  Illness: BLAYDE BACIGALUPI is an 84 year old male with history of CKD, HTN, ICM/CAD s/p STEMI with PCI/DES with residual disease RCA who was discharged to home on 05/30/18. CT head revealed large largebilateral SDH of differing ages with mass-effect on superior frontal and parietal lobes.Serial CT of head recommended by neurosurgery and DAPT held. Cardiology consulted and cleared patient for surgery. He underwent bilateral bur hole evacuation of subacute SDH on 06/27/2018 by Dr. Saintclair Halsted. Most recent CT head showed decrease in size of bilateral SDH with moderate bilateral  pneumocephalus. Postop patient continue have issues with confusion, poor safety awareness and unsteady gait.   Complete NIHSS TOTAL: 6 Glasgow Coma Scale Score: 15  Past Medical History      Past Medical History:  Diagnosis Date  . CAD (coronary artery disease)    a.  inferior STEMI s/p DES to D1 with residual disease - planned PCI to RCA, EF 45-50%.  . CKD (chronic kidney disease), stage II   . Hyperlipidemia, mild   . Hypertension   . Ischemic cardiomyopathy   . Kidney stones   . Pre-diabetes   . RBBB   . Thrombocytopenia ([place])   . Tobacco abuse     Family History  family history includes Hyperlipidemia in his father; Hypertension in his father.  Prior Rehab/Hospitalizations:  Has the patient had prior rehab or hospitalizations prior to admission? Yes, was hospitalized in February of 2020 with STEMI  Has the patient had major surgery during 100 days prior to admission? Yes  PCI in February 2020, and burr hole surgery to evacuate bilateral SDHs in March of 2020 (this admission).   Current Medications   Current Facility-Administered Medications:  .  0.9 % NaCl with KCl 20 mEq/ L  infusion, , Intravenous, Continuous, Cherene Altes, MD, Last Rate: 50 mL/hr at 06/29/18 1853 .  acetaminophen (TYLENOL) tablet 650 mg, 650 mg, Oral, Q6H PRN **OR** acetaminophen (TYLENOL) suppository 650 mg,  650 mg, Rectal, Q6H PRN, Kary Kos, MD .  aspirin EC tablet 81 mg, 81 mg, Oral, Daily, Kary Kos, MD, 81 mg at 06/30/18 1124 .  docusate sodium (COLACE) capsule 100 mg, 100 mg, Oral, BID, Kary Kos, MD, 100 mg at 06/30/18 1125 .  labetalol (NORMODYNE,TRANDATE) injection 10-40 mg, 10-40 mg, Intravenous, Q10 min PRN, Kary Kos, MD .  levETIRAcetam (KEPPRA) IVPB 500 mg/100 mL premix, 500 mg, Intravenous, Q12H, Kary Kos, MD, Last Rate: 400 mL/hr at 06/30/18 1121, 500 mg at 06/30/18 1121 .  ondansetron (ZOFRAN) tablet 4 mg, 4 mg, Oral, Q4H PRN **OR** ondansetron (ZOFRAN) injection 4 mg, 4 mg, Intravenous, Q4H PRN, Kary Kos, MD .  pantoprazole (PROTONIX) EC tablet 40 mg, 40 mg, Oral, QHS, Hall, Carole N, DO, 40 mg at 06/29/18 2201 .  promethazine (PHENERGAN) tablet 12.5-25 mg, 12.5-25 mg, Oral, Q4H PRN, Kary Kos, MD  Patients Current Diet:     Diet Order                  Diet - low sodium heart healthy         Diet regular Room service appropriate? Yes; Fluid consistency: Thin  Diet effective now               Precautions / Restrictions Precautions Precautions: Fall Precaution Comments: recent burr hole surgery Restrictions Weight Bearing Restrictions: No   Has the patient had 2 or more falls or a fall with injury in the past year?Yes, the fall that led to this admission  Prior Activity Level Community (5-7x/wk): very active, ambulating without an AD, still driving  Prior Functional Level Prior Function Level of Independence: Independent Comments: pt dtr reports that this functional decline is "night and day" and he was totally ind and driving prior to incident  Self Care: Did the patient need help bathing, dressing, using the toilet or eating?  Independent  Indoor Mobility: Did the patient need assistance with walking from room to room (with or without device)? Independent  Stairs: Did the patient need assistance with internal or external stairs  (  with or without device)? Independent  Functional Cognition: Did the patient need help planning regular tasks such as shopping or remembering to take medications? Independent  Home Assistive Devices / Equipment Home Assistive Devices/Equipment: None Home Equipment: None  Prior Device Use: Indicate devices/aids used by the patient prior to current illness, exacerbation or injury? None of the above  Current Functional Level Cognition  Overall Cognitive Status: Impaired/Different from baseline Current Attention Level: Sustained Orientation Level: Oriented to person, Oriented to place, Oriented to situation Following Commands: Follows one step commands with increased time, Follows one step commands consistently, Follows multi-step commands with increased time Safety/Judgement: Decreased awareness of safety, Decreased awareness of deficits General Comments: Administered Mini-Cog Screening to assess memory, languange, visual-motor, and exective functioning. Pt scored a 1 out of 5 indicating meaningful cognitive impairment . Pt unable to recall ST memory words (version 1). Pt placing numbers in correct positions on clock, but inappropiately placed hands (both clock hands pointing to number 11).  Pt asking reassuring questions during test such as "do you want me to start at the 12?" Pt performing a trail making task to locate the exit. Provided him three step directions. Pt initially confused and required Min cues to realize confusion; pt then returning to his room to restart. Pt abel to recall his room number throughout task. During this session, pt demosntrating increased problem solving, awareness of deficits, and memory. However, continues to present with deficits impacting his safety with ADLs and IADLs.     Extremity Assessment (includes Sensation/Coordination)  Upper Extremity Assessment: RUE deficits/detail, LUE deficits/detail RUE Deficits / Details: generalized weakness LUE Deficits /  Details: Decreased strength LUE Sensation: decreased light touch(further to test proprio) LUE Coordination: decreased fine motor, decreased gross motor  Lower Extremity Assessment: Defer to PT evaluation RLE Deficits / Details: generalized weakness RLE Sensation: WNL RLE Coordination: decreased fine motor, decreased gross motor LLE Deficits / Details: generalized weakness LLE Sensation: WNL LLE Coordination: decreased fine motor    ADLs  Overall ADL's : Needs assistance/impaired Eating/Feeding: Supervision/ safety, Set up, Sitting Eating/Feeding Details (indicate cue type and reason): Pt self feeding breakfast upon arrival.  Grooming: Minimal assistance, Sitting Grooming Details (indicate cue type and reason): cleaning and putting in dentures Upper Body Bathing: Maximal assistance, Sitting, Cueing for compensatory techniques, Cueing for sequencing, Cueing for safety Lower Body Bathing: Maximal assistance, Sit to/from stand, Cueing for safety, Cueing for sequencing, Cueing for compensatory techniques Upper Body Dressing : Maximal assistance, Sitting, Cueing for sequencing, Cueing for compensatory techniques, Cueing for UE precautions Lower Body Dressing: Maximal assistance, Sit to/from stand, Cueing for compensatory techniques, Cueing for sequencing, Cueing for safety, Sitting/lateral leans Lower Body Dressing Details (indicate cue type and reason): to pull up sock while seated in recliner, needing guarding when leaning over then cues to use figure 4 position; pt then needing max A due to gen weakness in RUE, tone in LUE Toilet Transfer: Minimal assistance, Moderate assistance, +2 for safety/equipment, Ambulation, RW(simulated to recliner) Toilet Transfer Details (indicate cue type and reason): Min-Mod A for balance and RW management.  Toileting- Clothing Manipulation and Hygiene: Maximal assistance, Sit to/from stand, Sitting/lateral lean Tub/ Shower Transfer: Maximal assistance, Shower  seat, Rolling walker, Ambulation Functional mobility during ADLs: Minimal assistance, Moderate assistance, +2 for physical assistance, +2 for safety/equipment, Rolling walker General ADL Comments: Focused session on cognition to challenge memory, problem solving, and awareness. Pt performing trail making task and completing Mini-Cog screening.     Mobility  Overal bed mobility:  Needs Assistance Bed Mobility: Supine to Sit Supine to sit: Min guard General bed mobility comments: min guard for safety as patient impulsively quick not paying attention to lines or blankets that he could get caught up on    Transfers  Overall transfer level: Needs assistance Equipment used: None Transfer via Lift Equipment: Stedy Transfers: Sit to/from [employer] to Stand: Min guard General transfer comment: Geophysicist/field seismologist A for safety    Ambulation / Gait / Stairs / Emergency planning/management officer  Ambulation/Gait Ambulation/Gait assistance: Herbalist (Feet): 200 Feet Assistive device: None Gait Pattern/deviations: Step-through pattern, Decreased stride length General Gait Details: pt safer without RW than with. Pt cont to be easily distracted causing episode of mild instability, max directional verbal cues for halway navigation however was able to find room with minimal cuing Gait velocity: slow Gait velocity interpretation: 1.31 - 2.62 ft/sec, indicative of limited community ambulator Stairs: Yes Stairs assistance: Min assist Stair Management: Step to pattern(HHA for handrail as patient with no HR at home) Number of Stairs: 2 General stair comments: minA for safety due to lines    Posture / Balance Balance Overall balance assessment: Needs assistance Sitting-balance support: No upper extremity supported Sitting balance-Leahy Scale: Fair Standing balance support: No upper extremity supported Standing balance-Leahy Scale: Fair Standing balance comment: dependent on RW    Special needs/care  consideration BiPAP/CPAP no  CPM no Continuous Drip IV keppra and fluids Dialysis no        Days n/a Life Vest no Oxygen no Special Bed no Trach Size no Wound Vac (area) no      Skin ecchymosis and abrasions to bilateral knees and shoulders, incision to bilateral scalp                   Bowel mgmt:  Bladder mgmt: no BM this admission Diabetic mgmt no Behavioral consideration impulsive Chemo/radiation no     Previous Home Environment (from acute therapy documentation) Living Arrangements: Spouse/significant other Available Help at Discharge: Family, Available 24 hours/day Type of Home: House Home Layout: One level Home Access: Stairs to enter [employer] of Steps: 2 Bathroom Shower/Tub: Optometrist: Yes Home Care Services: No  Discharge Living Setting Plans for Discharge Living Setting: Patient's home Type of Home at Discharge: House Discharge Home Layout: One level Discharge Home Access: Stairs to enter Entrance Stairs-Rails: None Entrance Stairs-Number of Steps: 2 Discharge Bathroom Shower/Tub: Tub/shower unit Discharge Bathroom Toilet: Standard Discharge Bathroom Accessibility: Yes How Accessible: Accessible via walker Does the patient have any problems obtaining your medications?: No  Social/Family/Support Systems Patient Roles: Spouse, Parent Anticipated Caregiver: wife, Mechele Claude, and daughters, [name] and [place] Anticipated Caregiver's Contact Information: Mechele Claude [phone], Chong Sic[place] [phone] Ability/Limitations of Caregiver: none anticipated Caregiver Availability: 24/7 Discharge Plan Discussed with Primary Caregiver: Yes Is Caregiver In Agreement with Plan?: Yes Does Caregiver/Family have Issues with Lodging/Transportation while Pt is in Rehab?: No   Goals/Additional Needs Patient/Family Goal for Rehab: PT/OT/SLP supervision Expected length of stay: 9-14 days  Cultural Considerations: none Dietary Needs: regular, thin Equipment Needs: none Special Service Needs: none Additional Information: per wife, pt chews tobacco and she wonders whether he may benefit from a nicotine patch Pt/Family Agrees to Admission and willing to participate: Yes Program Orientation Provided & Reviewed with Pt/Caregiver Including Roles  & Responsibilities: Yes    Possible need for SNF placement upon discharge: not anticipated   Patient Condition: This patient's medical and functional status has changed since  the consult dated: 06/28/2018 in which the Rehabilitation Physician determined and documented that the patient's condition is appropriate for intensive rehabilitative care in an inpatient rehabilitation facility. See "History of Present Illness" (above) for medical update. Functional changes are: pt currently min assist overall with cognitive deficits impacting patient's safety and ability to problem solve. Patient's medical and functional status update has been discussed with the Rehabilitation physician and patient remains appropriate for inpatient rehabilitation. Will admit to inpatient rehab today.  Preadmission Screen Completed By:  Michel Santee, PT, 06/30/2018 12:36 PM ______________________________________________________________________   Discussed status with Dr. Letta Pate on 06/30/18 at 1:10 PM  and received approval for admission today.  Admission Coordinator:  Michel Santee, time 1:11 PM Sudie Grumbling 06/30/18          Cosigned by: Charlett Blake, MD at 06/30/2018 2:01 PM  Revision History

## 2018-06-30 NOTE — TOC Transition Note (Addendum)
Transition of Care [facility]) - CM/SW Discharge Note   Patient Details  Name: Craig [name] MRN: [id number] Date of Birth: July 13, 1934  [facility]) CM/SW Contact:  Glennon Mac, RN Phone Number: 06/30/2018, 1:44 PM   Clinical Narrative:  Pt admitted on 06/23/2018 s/p multiple falls with progressive mental and functional decline.  He was found to have bilateral subacute SDH, requiring bilateral bur hole craniectomy for evacuation on 06/27/2018.  PT/OT recommending CIR, and consult requested.  Wife and daughter able to provide 24h care at discharge.   Pt medically stable and insurance authorization has been received for admission today.  Plan dc to CIR once bed available.        Final next level of care: IP Rehab Facility Barriers to Discharge: No Barriers Identified     Readmission Risk Interventions No flowsheet data found.  Quintella Baton, RN, BSN  Trauma/Neuro ICU Case Manager [phone]

## 2018-06-30 NOTE — PMR Pre-admission (Signed)
PMR Admission Coordinator Pre-Admission Assessment  Patient: Craig [name] is an 84 y.o., male MRN: [id number] DOB: Sep 26, 1934 Height: _0  (177.8 cm) Weight: 81.6 kg              Insurance Information HMO: yes    PPO:      PCP:      IPA:      80/20:      OTHER:  PRIMARY: [employer] Medicare      Policy#: [id number]      Subscriber: patient CM Name: Craig [name]      Phone#: [phone]     Fax#:  Pre-Cert#: [id number] with updates due to Craig [name] on 07/06/2018 (phone [phone], fax [phone]) Employer:  Benefits:  Phone #: [phone]     Name:  Craig [name]. Date: 04/06/2018     Deduct: $0      Out of Pocket Max: $4,500 (met $672.17) Life Max:  [name]/a CIR: $325/day days 1-5, $0/day remaining days      SNF: $0/day days 1-20, $160/day days 21-49, $0/day days 50-100 Outpatient:      Co-Pay: $35/visit Home Health: 100%      Co-Pay:  DME: 80%     Co-Pay: 20% Providers: patient choice    Medicaid Application Date:       Case Manager:  Disability Application Date:       Case Worker:   Emergency Contact Information Contact Information    Name Relation Home Work Mobile   Craig [name] Daughter [phone]  [phone]   Craig [name] Daughter [phone]  [phone]   [place], Hammack [phone]  [phone]     Current Medical History  Patient Admitting Diagnosis: bilateral SDH with burr hole evacuation History of Present Illness: Craig [name] is an 84 year old male with history of CKD, HTN, ICM/CAD s/p STEMI with PCI/DES with residual disease RCA who was discharged to home on 05/30/18. CT head revealed large large bilateral SDH of differing ages with mass-effect on superior frontal and parietal lobes.  Serial CT of head recommended by neurosurgery and DAPT held.  Cardiology consulted and cleared patient for surgery.  He underwent bilateral bur hole evacuation of subacute SDH on 06/27/2018 by Craig [name].  Most recent CT head showed decrease in size of bilateral SDH with  moderate bilateral  pneumocephalus.  Postop patient continue have issues with confusion, poor safety awareness and unsteady gait.    Complete NIHSS TOTAL: 6 Glasgow Coma Scale Score: 15  Past Medical History  Past Medical History:  Diagnosis Date  . CAD (coronary artery disease)    a.  inferior STEMI s/p DES to D1 with residual disease - planned PCI to RCA, EF 45-50%.  . CKD (chronic kidney disease), stage II   . Hyperlipidemia, mild   . Hypertension   . Ischemic cardiomyopathy   . Kidney stones   . Pre-diabetes   . RBBB   . Thrombocytopenia ([place])   . Tobacco abuse     Family History  family history includes Hyperlipidemia in his father; Hypertension in his father.  Prior Rehab/Hospitalizations:  Has the patient had prior rehab or hospitalizations prior to admission? Yes, was hospitalized in February of 2020 with STEMI  Has the patient had major surgery during 100 days prior to admission? Yes  PCI in February 2020, and burr hole surgery to evacuate bilateral SDHs in March of 2020 (this admission).   Current Medications   Current Facility-Administered Medications:  .  0.9 % NaCl with KCl 20 mEq/ L  infusion, ,  Intravenous, Continuous, Craig Altes, MD, Last Rate: 50 mL/hr at 06/29/18 1853 .  acetaminophen (TYLENOL) tablet 650 mg, 650 mg, Oral, Q6H PRN **OR** acetaminophen (TYLENOL) suppository 650 mg, 650 mg, Rectal, Q6H PRN, Craig Kos, MD .  aspirin EC tablet 81 mg, 81 mg, Oral, Daily, Craig Kos, MD, 81 mg at 06/30/18 1124 .  docusate sodium (COLACE) capsule 100 mg, 100 mg, Oral, BID, Craig Kos, MD, 100 mg at 06/30/18 1125 .  labetalol (NORMODYNE,TRANDATE) injection 10-40 mg, 10-40 mg, Intravenous, Q10 min PRN, Craig Kos, MD .  levETIRAcetam (KEPPRA) IVPB 500 mg/100 mL premix, 500 mg, Intravenous, Q12H, Craig Kos, MD, Last Rate: 400 mL/hr at 06/30/18 1121, 500 mg at 06/30/18 1121 .  ondansetron (ZOFRAN) tablet 4 mg, 4 mg, Oral, Q4H PRN **OR** ondansetron (ZOFRAN)  injection 4 mg, 4 mg, Intravenous, Q4H PRN, Craig Kos, MD .  pantoprazole (PROTONIX) EC tablet 40 mg, 40 mg, Oral, QHS, Hall, Carole N, DO, 40 mg at 06/29/18 2201 .  promethazine (PHENERGAN) tablet 12.5-25 mg, 12.5-25 mg, Oral, Q4H PRN, Craig Kos, MD  Patients Current Diet:  Diet Order            Diet - low sodium heart healthy        Diet regular Room service appropriate? Yes; Fluid consistency: Thin  Diet effective now              Precautions / Restrictions Precautions Precautions: Fall Precaution Comments: recent burr hole surgery Restrictions Weight Bearing Restrictions: No   Has the patient had 2 or more falls or a fall with injury in the past year?Yes, the fall that led to this admission  Prior Activity Level Community (5-7x/wk): very active, ambulating without an AD, still driving  Prior Functional Level Prior Function Level of Independence: Independent Comments: pt dtr reports that this functional decline is "night and day" and he was totally ind and driving prior to incident  Self Care: Did the patient need help bathing, dressing, using the toilet or eating?  Independent  Indoor Mobility: Did the patient need assistance with walking from room to room (with or without device)? Independent  Stairs: Did the patient need assistance with internal or external stairs (with or without device)? Independent  Functional Cognition: Did the patient need help planning regular tasks such as shopping or remembering to take medications? Independent  Home Assistive Devices / Equipment Home Assistive Devices/Equipment: None Home Equipment: None  Prior Device Use: Indicate devices/aids used by the patient prior to current illness, exacerbation or injury? None of the above  Current Functional Level Cognition  Overall Cognitive Status: Impaired/Different from baseline Current Attention Level: Sustained Orientation Level: Oriented to person, Oriented to place, Oriented to  situation Following Commands: Follows one step commands with increased time, Follows one step commands consistently, Follows multi-step commands with increased time Safety/Judgement: Decreased awareness of safety, Decreased awareness of deficits General Comments: Administered Mini-Cog Screening to assess memory, languange, visual-motor, and exective functioning. Pt scored a 1 out of 5 indicating meaningful cognitive impairment . Pt unable to recall ST memory words (version 1). Pt placing numbers in correct positions on clock, but inappropiately placed hands (both clock hands pointing to number 11).  Pt asking reassuring questions during test such as "do you want me to start at the 12?" Pt performing a trail making task to locate the exit. Provided him three step directions. Pt initially confused and required Min cues to realize confusion; pt then returning to his room to restart. Pt  abel to recall his room number throughout task. During this session, pt demosntrating increased problem solving, awareness of deficits, and memory. However, continues to present with deficits impacting his safety with ADLs and IADLs.     Extremity Assessment (includes Sensation/Coordination)  Upper Extremity Assessment: RUE deficits/detail, LUE deficits/detail RUE Deficits / Details: generalized weakness LUE Deficits / Details: Decreased strength LUE Sensation: decreased light touch(further to test proprio) LUE Coordination: decreased fine motor, decreased gross motor  Lower Extremity Assessment: Defer to PT evaluation RLE Deficits / Details: generalized weakness RLE Sensation: WNL RLE Coordination: decreased fine motor, decreased gross motor LLE Deficits / Details: generalized weakness LLE Sensation: WNL LLE Coordination: decreased fine motor    ADLs  Overall ADL's : Needs assistance/impaired Eating/Feeding: Supervision/ safety, Set up, Sitting Eating/Feeding Details (indicate cue type and reason): Pt self feeding  breakfast upon arrival.  Grooming: Minimal assistance, Sitting Grooming Details (indicate cue type and reason): cleaning and putting in dentures Upper Body Bathing: Maximal assistance, Sitting, Cueing for compensatory techniques, Cueing for sequencing, Cueing for safety Lower Body Bathing: Maximal assistance, Sit to/from stand, Cueing for safety, Cueing for sequencing, Cueing for compensatory techniques Upper Body Dressing : Maximal assistance, Sitting, Cueing for sequencing, Cueing for compensatory techniques, Cueing for UE precautions Lower Body Dressing: Maximal assistance, Sit to/from stand, Cueing for compensatory techniques, Cueing for sequencing, Cueing for safety, Sitting/lateral leans Lower Body Dressing Details (indicate cue type and reason): to pull up sock while seated in recliner, needing guarding when leaning over then cues to use figure 4 position; pt then needing max A due to gen weakness in RUE, tone in LUE Toilet Transfer: Minimal assistance, Moderate assistance, +2 for safety/equipment, Ambulation, RW(simulated to recliner) Toilet Transfer Details (indicate cue type and reason): Min-Mod A for balance and RW management.  Toileting- Clothing Manipulation and Hygiene: Maximal assistance, Sit to/from stand, Sitting/lateral lean Tub/ Shower Transfer: Maximal assistance, Shower seat, Rolling walker, Ambulation Functional mobility during ADLs: Minimal assistance, Moderate assistance, +2 for physical assistance, +2 for safety/equipment, Rolling walker General ADL Comments: Focused session on cognition to challenge memory, problem solving, and awareness. Pt performing trail making task and completing Mini-Cog screening.     Mobility  Overal bed mobility: Needs Assistance Bed Mobility: Supine to Sit Supine to sit: Min guard General bed mobility comments: min guard for safety as patient impulsively quick not paying attention to lines or blankets that he could get caught up on     Transfers  Overall transfer level: Needs assistance Equipment used: None Transfer via Lift Equipment: Stedy Transfers: Sit to/from [employer] to Stand: Min guard General transfer comment: Geophysicist/field seismologist A for safety    Ambulation / Gait / Stairs / Emergency planning/management officer  Ambulation/Gait Ambulation/Gait assistance: Herbalist (Feet): 200 Feet Assistive device: None Gait Pattern/deviations: Step-through pattern, Decreased stride length General Gait Details: pt safer without RW than with. Pt cont to be easily distracted causing episode of mild instability, max directional verbal cues for halway navigation however was able to find room with minimal cuing Gait velocity: slow Gait velocity interpretation: 1.31 - 2.62 ft/sec, indicative of limited community ambulator Stairs: Yes Stairs assistance: Min assist Stair Management: Step to pattern(HHA for handrail as patient with no HR at home) Number of Stairs: 2 General stair comments: minA for safety due to lines    Posture / Balance Balance Overall balance assessment: Needs assistance Sitting-balance support: No upper extremity supported Sitting balance-Leahy Scale: Fair Standing balance support: No upper extremity supported Standing balance-Leahy  Scale: Fair Standing balance comment: dependent on RW    Special needs/care consideration BiPAP/CPAP no  CPM no Continuous Drip IV keppra and fluids Dialysis no        Days [name]/a Life Vest no Oxygen no Special Bed no Trach Size no Wound Vac (area) no      Skin ecchymosis and abrasions to bilateral knees and shoulders, incision to bilateral scalp                   Bowel mgmt:  Bladder mgmt: no BM this admission Diabetic mgmt no Behavioral consideration impulsive Chemo/radiation no     Previous Home Environment (from acute therapy documentation) Living Arrangements: Spouse/significant other Available Help at Discharge: Family, Available 24 hours/day Type of Home: House Home  Layout: One level Home Access: Stairs to enter [employer] of Steps: 2 Bathroom Shower/Tub: Optometrist: Yes Home Care Services: No  Discharge Living Setting Plans for Discharge Living Setting: Patient's home Type of Home at Discharge: House Discharge Home Layout: One level Discharge Home Access: Stairs to enter Entrance Stairs-Rails: None Entrance Stairs-Number of Steps: 2 Discharge Bathroom Shower/Tub: Tub/shower unit Discharge Bathroom Toilet: Standard Discharge Bathroom Accessibility: Yes How Accessible: Accessible via walker Does the patient have any problems obtaining your medications?: No  Social/Family/Support Systems Patient Roles: Spouse, Parent Anticipated Caregiver: wife, Mechele Claude, and daughters, [name] and Marion Anticipated Caregiver's Contact Information: Mechele Claude [phone] 1989, Chong Sicilian [phone] Ability/Limitations of Caregiver: none anticipated Caregiver Availability: 24/7 Discharge Plan Discussed with Primary Caregiver: Yes Is Caregiver In Agreement with Plan?: Yes Does Caregiver/Family have Issues with Lodging/Transportation while Pt is in Rehab?: No   Goals/Additional Needs Patient/Family Goal for Rehab: PT/OT/SLP supervision Expected length of stay: 9-14 days Cultural Considerations: none Dietary Needs: regular, thin Equipment Needs: none Special Service Needs: none Additional Information: per wife, pt chews tobacco and she wonders whether he may benefit from a nicotine patch Pt/Family Agrees to Admission and willing to participate: Yes Program Orientation Provided & Reviewed with Pt/Caregiver Including Roles  & Responsibilities: Yes    Possible need for SNF placement upon discharge: not anticipated   Patient Condition: This patient's medical and functional status has changed since the consult dated: 06/28/2018 in which the Rehabilitation Physician determined and  documented that the patient's condition is appropriate for intensive rehabilitative care in an inpatient rehabilitation facility. See "History of Present Illness" (above) for medical update. Functional changes are: pt currently min assist overall with cognitive deficits impacting patient's safety and ability to problem solve. Patient's medical and functional status update has been discussed with the Rehabilitation physician and patient remains appropriate for inpatient rehabilitation. Will admit to inpatient rehab today.  Preadmission Screen Completed By:  Michel Santee, PT, 06/30/2018 12:36 PM ______________________________________________________________________   Discussed status with Dr. Letta Pate on 06/30/18 at 1:10 PM  and received approval for admission today.  Admission Coordinator:  Michel Santee, time 1:11 PM Sudie Grumbling 06/30/18

## 2018-06-30 NOTE — Progress Notes (Addendum)
Inpatient Rehab Admissions Coordinator:   I met with patient at bedside and spoke with wife/daughter over the phone.  They continue to want an inpatient rehab stay prior to patient returning home and I have insurance approval.  I have contacted MD and await medical clearance prior to possible admission today.  Please call with questions.    Addendum: I have clearance from MD to bring patient today.  I have alerted floor RN, RNCM, and CSW as well as patient's family.    Shann Medal, PT, DPT Admissions Coordinator [phone] 06/30/18  10:55 AM

## 2018-06-30 NOTE — Anesthesia Postprocedure Evaluation (Signed)
Anesthesia Post Note  Patient: Craig [name]  Procedure(s) Performed: [name] (N/A )     Patient location during evaluation: Other Anesthesia Type: General Level of consciousness: awake and alert, oriented and patient cooperative Pain management: pain level controlled Vital Signs Assessment: post-procedure vital signs reviewed and stable Respiratory status: spontaneous breathing, nonlabored ventilation and respiratory function stable Cardiovascular status: blood pressure returned to baseline and stable Postop Assessment: no apparent nausea or vomiting and adequate PO intake Anesthetic complications: no Comments: Pt has been discharged/moved to rehab unit    Last Vitals:  Vitals:   06/30/18 1128 06/30/18 1200  BP: 140/61 (!) 148/79  Pulse: (!) 101 82  Resp: (!) 22 18  Temp: 36.7 C   SpO2: 97% 93%    Last Pain:  Vitals:   06/30/18 1128  TempSrc: Oral  PainSc:                  Keelyn Monjaras,E. Vallie Fayette

## 2018-06-30 NOTE — Progress Notes (Signed)
Marcello Fennel, MD  Physician  Physical Medicine and Rehabilitation  Consult Note  Signed  Date of Service:  06/28/2018 10:56 AM       Related encounter: ED to Hosp-Admission (Current) from 06/23/2018 in [facility]      Signed      Expand All Collapse All    Show:Clear all Manual[x] Template[] Copied  Added by: Love, Evlyn Kanner, PA-C[x] Marcello Fennel, MD  Hover for details      Physical Medicine and Rehabilitation Consult   Reason for Consult: Functional decline due to SDH Referring Physician: Dr. Wynetta Emery   HPI: Craig [name] is a 84 y.o. male with history of CKD, HTN,  ICM/CAD s/p STEMI with PCI/DES with residual disease RCA who was discharged on DAPT but patient started experiencing weakness with falls X 2, slurred speech and confusion. History taken from chart review. He was admitted on 06/23/2018 and CT head revealed large bilateral SDH of differing ages with mass effect on superior frontal and parietal lobes. Serial CT recommended by NS and DAPT held. Cardiology consulted for clearance and patient underwent bilateral burr hole evacuation of subacute SDH on 06/27/2018 by Dr. Wynetta Emery. Most recent CT head reviewed, showing moderate b/l SDH with mild/moderate pneumocephalus. Post op patient continues to be confused with poor safety awareness and unsteady gait. CIR recommended due to functional decline.    Review of Systems  Constitutional: Negative for chills and fever.  HENT: Positive for hearing loss.   Eyes: Negative for blurred vision and double vision.  Respiratory: Negative for cough and shortness of breath.   Cardiovascular: Negative for chest pain.  Gastrointestinal: Negative for abdominal pain and heartburn.  Musculoskeletal: Positive for joint pain (occasional left hip pain?).  Skin: Negative for itching and rash.  Neurological: Negative for dizziness, weakness and headaches.  Psychiatric/Behavioral: Positive for  memory loss.  All other systems reviewed and are negative.         Past Medical History:  Diagnosis Date  . CAD (coronary artery disease)    a.  inferior STEMI s/p DES to D1 with residual disease - planned PCI to RCA, EF 45-50%.  . CKD (chronic kidney disease), stage II   . Hyperlipidemia, mild   . Hypertension   . Ischemic cardiomyopathy   . Kidney stones   . Pre-diabetes   . RBBB   . Thrombocytopenia (HCC)   . Tobacco abuse     Past Surgical History:  Procedure Laterality Date  . CORONARY/GRAFT ACUTE MI REVASCULARIZATION N/A 05/30/2018   Procedure: CORONARY/GRAFT ACUTE MI REVASCULARIZATION;  Surgeon: Lyn Records, MD;  Location: [facility] INVASIVE CV LAB;  Service: Cardiovascular;  Laterality: N/A;  . LEFT HEART CATH AND CORONARY ANGIOGRAPHY N/A 05/30/2018   Procedure: LEFT HEART CATH AND CORONARY ANGIOGRAPHY;  Surgeon: Lyn Records, MD;  Location: [facility] INVASIVE CV LAB;  Service: Cardiovascular;  Laterality: N/A;         Family History  Problem Relation Age of Onset  . Hyperlipidemia Father   . Hypertension Father     Social History:   Married. Independent PTA? 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 He reports that he quit smoking about 60 years ago. His smokeless tobacco use includes chew. He reports that he does not drink alcohol or use drugs.   Allergies: No Known Allergies          Medications Prior to Admission  Medication Sig Dispense Refill  . aspirin 81 MG chewable tablet Chew 1  tablet (81 mg total) by mouth daily.    . nitroGLYCERIN (NITROSTAT) 0.4 MG SL tablet Place 1 tablet (0.4 mg total) under the tongue every 5 (five) minutes as needed. (Patient taking differently: Place 0.4 mg under the tongue every 5 (five) minutes as needed for chest pain. ) 25 tablet 2  . ticagrelor (BRILINTA) 90 MG TABS tablet Take 1 tablet (90 mg total) by mouth 2 (two) times daily. 180 tablet 2  . atorvastatin (LIPITOR) 80 MG tablet Take 1 tablet  (80 mg total) by mouth daily at 6 PM. (Patient not taking: Reported on 06/23/2018) 30 tablet 3    Home: Home Living Family/patient expects to be discharged to:: (P) Private residence Living Arrangements: (P) Spouse/significant other Available Help at Discharge: (P) Family, Available 24 hours/day Type of Home: (P) House Home Access: (P) Stairs to enter Entrance Stairs-Number of Steps: (P) 2 Home Layout: (P) One level Bathroom Shower/Tub: (P) Tub/shower unit Bathroom Toilet: (P) Standard Bathroom Accessibility: (P) Yes Home Equipment: (P) None  Functional History: Prior Function Level of Independence: (P) Independent Comments: (P) pt dtr reports that this functional decline is "night and day" and he was totally ind and driving prior to incident Functional Status:  Mobility: Bed Mobility Overal bed mobility: (P) Needs Assistance Bed Mobility: (P) Supine to Sit General bed mobility comments: (P) up in chair on arrival Transfers Overall transfer level: (P) Needs assistance Transfer via Lift Equipment: (P) Stedy Transfers: (P) Sit to/from Stand Sit to Stand: (P) Mod assist, +2 safety/equipment, +2 physical assistance General transfer comment: (P) mod A for initial power up to stand in steady, cueing for hand placement and support needed on LUE due to tone. Pt able to pull self up in standing once seated on steady Ambulation/Gait General Gait Details: (P) unable to attempt due to weakness  ADL: ADL Overall ADL's : Needs assistance/impaired Eating/Feeding: Cueing for sequencing, Cueing for compensatory techinques, Cueing for safety, Sitting, Moderate assistance Eating/Feeding Details (indicate cue type and reason): cueing for sequencing, initiation and termination to feed self meal. Dtr cut up fruit into smaller pieces Grooming: Moderate assistance, Sitting, Cueing for safety, Cueing for sequencing, Cueing for compensatory techniques Upper Body Bathing: Maximal assistance, Sitting,  Cueing for compensatory techniques, Cueing for sequencing, Cueing for safety Lower Body Bathing: Maximal assistance, Sit to/from stand, Cueing for safety, Cueing for sequencing, Cueing for compensatory techniques Upper Body Dressing : Maximal assistance, Sitting, Cueing for sequencing, Cueing for compensatory techniques, Cueing for UE precautions Lower Body Dressing: Maximal assistance, Sit to/from stand, Cueing for compensatory techniques, Cueing for sequencing, Cueing for safety, Sitting/lateral leans Lower Body Dressing Details (indicate cue type and reason): to pull up sock while seated in recliner, needing guarding when leaning over then cues to use figure 4 position; pt then needing max A due to gen weakness in RUE, tone in LUE Toilet Transfer: Moderate assistance, Grab bars, RW Toileting- Clothing Manipulation and Hygiene: Maximal assistance, Sit to/from stand, Sitting/lateral lean Tub/ Shower Transfer: Maximal assistance, Shower seat, Rolling walker, Ambulation Functional mobility during ADLs: Moderate assistance, +2 for physical assistance, +2 for safety/equipment(used steady) General ADL Comments: pt with decreased cognition this date per dtr, needing variety of cues to complete BADL; LUE tone noted  Cognition: Cognition Overall Cognitive Status: (P) Impaired/Different from baseline Orientation Level: (P) Oriented to person, Oriented to time, Disoriented to place, Disoriented to situation Cognition Arousal/Alertness: (P) Awake/alert Behavior During Therapy: (P) WFL for tasks assessed/performed Overall Cognitive Status: (P) Impaired/Different from baseline Area of Impairment: (  P) Orientation, Attention, Memory, Following commands, Safety/judgement, Awareness, Problem solving Orientation Level: (P) Disoriented to, Place, Time, Situation Current Attention Level: (P) Sustained Memory: (P) Decreased recall of precautions, Decreased short-term memory(despite re-orienting patient x 5, no  recall) Following Commands: (P) Follows one step commands with increased time, Follows one step commands consistently, Follows multi-step commands inconsistently Safety/Judgement: (P) Decreased awareness of safety, Decreased awareness of deficits Awareness: (P) Intellectual Problem Solving: (P) Slow processing, Decreased initiation, Difficulty sequencing, Requires verbal cues, Requires tactile cues General Comments: (P) pt with very impaired memory, attention, and processing.   Blood pressure (!) 122/53, pulse 64, temperature (!) 97 F (36.1 C), temperature source Axillary, resp. rate 12, height 5\' 10"  (1.778 m), weight 81.6 kg, SpO2 96 %. Physical Exam  Nursing note and vitals reviewed. Constitutional: He appears well-developed and well-nourished.  HENT:  Right Ear: External ear normal.  Left Ear: External ear normal.  Honeycomb dressings on scalp.   Eyes: EOM are normal. Right eye exhibits no discharge. Left eye exhibits no discharge.  Neck: Normal range of motion. Neck supple.  Cardiovascular: Normal rate and regular rhythm.  Respiratory: Effort normal and breath sounds normal.  GI: Soft. Bowel sounds are normal.  Musculoskeletal:     Comments: No edema or tenderness in extremities  Neurological: He is alert.  Oriented to self. Place as hospital with cues.  Able to state DOB, age and month.  He was able to follow simple one and two step motor commands.  Motor: 4+-5/5 throughout Sensation intact to light touch  Skin:  Resolving ecchymosis left shoulder--no pain with ROM. Left forearm with diffuse ecchymosis.  See above  Psychiatric: His speech is tangential. Cognition and memory are impaired.    LabResultsLast24Hours  Results for orders placed or performed during the hospital encounter of 06/23/18 (from the past 24 hour(s))  MRSA PCR Screening     Status: None   Collection Time: 06/27/18 11:20 AM  Result Value Ref Range   MRSA by PCR NEGATIVE NEGATIVE  Basic  metabolic panel     Status: Abnormal   Collection Time: 06/27/18 12:18 PM  Result Value Ref Range   Sodium 138 135 - 145 mmol/L   Potassium 4.2 3.5 - 5.1 mmol/L   Chloride 103 98 - 111 mmol/L   CO2 22 22 - 32 mmol/L   Glucose, Bld 179 (H) 70 - 99 mg/dL   BUN 22 8 - 23 mg/dL   Creatinine, Ser 1.301.26 (H) 0.61 - 1.24 mg/dL   Calcium 9.3 8.9 - 86.510.3 mg/dL   GFR calc non Af Amer 53 (L) >60 mL/min   GFR calc Af Amer >60 >60 mL/min   Anion gap 13 5 - 15  CBC     Status: None   Collection Time: 06/27/18 12:18 PM  Result Value Ref Range   WBC 9.3 4.0 - 10.5 K/uL   RBC 5.01 4.22 - 5.81 MIL/uL   Hemoglobin 14.6 13.0 - 17.0 g/dL   HCT 78.443.5 69.639.0 - 29.552.0 %   MCV 86.8 80.0 - 100.0 fL   MCH 29.1 26.0 - 34.0 pg   MCHC 33.6 30.0 - 36.0 g/dL   RDW 28.412.1 13.211.5 - 44.015.5 %   Platelets 161 150 - 400 K/uL   nRBC 0.0 0.0 - 0.2 %  Basic metabolic panel     Status: Abnormal   Collection Time: 06/28/18  5:01 AM  Result Value Ref Range   Sodium 137 135 - 145 mmol/L   Potassium 4.6 3.5 -  5.1 mmol/L   Chloride 104 98 - 111 mmol/L   CO2 21 (L) 22 - 32 mmol/L   Glucose, Bld 135 (H) 70 - 99 mg/dL   BUN 25 (H) 8 - 23 mg/dL   Creatinine, Ser 9.73 0.61 - 1.24 mg/dL   Calcium 9.2 8.9 - 53.2 mg/dL   GFR calc non Af Amer 54 (L) >60 mL/min   GFR calc Af Amer >60 >60 mL/min   Anion gap 12 5 - 15  CBC     Status: None   Collection Time: 06/28/18  5:01 AM  Result Value Ref Range   WBC 10.2 4.0 - 10.5 K/uL   RBC 4.50 4.22 - 5.81 MIL/uL   Hemoglobin 13.3 13.0 - 17.0 g/dL   HCT 99.2 42.6 - 83.4 %   MCV 87.1 80.0 - 100.0 fL   MCH 29.6 26.0 - 34.0 pg   MCHC 33.9 30.0 - 36.0 g/dL   RDW 19.6 22.2 - 97.9 %   Platelets 165 150 - 400 K/uL   nRBC 0.0 0.0 - 0.2 %  Magnesium     Status: None   Collection Time: 06/28/18  5:01 AM  Result Value Ref Range   Magnesium 2.1 1.7 - 2.4 mg/dL      [id number])  Ct Head Wo Contrast  Result Date: 06/28/2018  CLINICAL DATA:  Subdural hemorrhage follow-up EXAM: CT HEAD WITHOUT CONTRAST TECHNIQUE: Contiguous axial images were obtained from the base of the skull through the vertex without intravenous contrast. COMPARISON:  06/24/2018 FINDINGS: Brain: Status post bifrontal burr holes with right subdural drainage catheter. There is moderate bilateral pneumocephalus, left-greater-than-right. The size of the right subdural hematoma has markedly decreased, now measuring 14 mm in thickness, previously 22 mm. The left-sided hematoma now measures 17 mm in thickness, previously 20 mm in thickness. No new site of hemorrhage. At the level of the foramina Monro, rightward midline shift measures approximately 3 mm. Vascular: No abnormal hyperdensity of the major intracranial arteries or dural venous sinuses. No intracranial atherosclerosis. Skull: Bifrontal burr holes. Sinuses/Orbits: No fluid levels or advanced mucosal thickening of the visualized paranasal sinuses. No mastoid or middle ear effusion. The orbits are normal. IMPRESSION: 1. Decreased size of bilateral subdural hematomas status post bifrontal burr holes with placement of right subdural drainage catheter. 2. Moderate bilateral pneumocephalus, left-greater-than-right. Electronically Signed   By: Deatra Robinson M.D.   On: 06/28/2018 01:04     Assessment/Plan: Diagnosis: B/l SDH Labs and images (see above) independently reviewed.  Records reviewed and summated above.             [place] score:  >/VI             Speech to evaluate for Post traumatic amnesia and interval GOAT scores to assess progress.             NeuroPsych evaluation for behavorial assessment.             Provide environmental management by reducing the level of stimulation, tolerating restlessness when possible, protecting patient from harming self or others and reducing patient's cognitive confusion.             Address behavioral concerns include providing structured environments and  daily routines.             Cognitive therapy to direct modular abilities in order to maintain goals        including problem solving, self regulation/monitoring, self management, attention, and memory.  Fall precautions; pt at risk for second impact syndrome             Prevention of secondary injury: monitor for hypotension, hypoxia, seizures or signs of increased ICP             Prophylactic AED:              PT/OT consults for mobility strengthening, endurance training and adaptive ADLs              Avoid medications that could impair cognitive abilities, such as anticholinergics, antihistaminic, benzodiazapines, narcotics, etc when possible  1. Does the need for close, 24 hr/day medical supervision in concert with the patient's rehab needs make it unreasonable for this patient to be served in a less intensive setting? Yes  2. Co-Morbidities requiring supervision/potential complications: CKD (avoid nephrotoxic meds), HTN (monitor and provide prns in accordance with increased physical exertion and pain), ICM/CAD s/p STEMI with PCI/DES (cont meds) 3. Due to safety, skin/wound care, disease management, pain management and patient education, does the patient require 24 hr/day rehab nursing? Yes 4. Does the patient require coordinated care of a physician, rehab nurse, PT (1-2 hrs/day, 5 days/week), OT (1-2 hrs/day, 5 days/week) and SLP (1-2 hrs/day, 5 days/week) to address physical and functional deficits in the context of the above medical diagnosis(es)? Yes Addressing deficits in the following areas: balance, endurance, locomotion, transferring, bathing, dressing, toileting, cognition, language and psychosocial support 5. Can the patient actively participate in an intensive therapy program of at least 3 hrs of therapy per day at least 5 days per week? Yes 6. The potential for patient to make measurable gains while on inpatient rehab is excellent 7. Anticipated functional outcomes upon  discharge from inpatient rehab are supervision  with PT, supervision with OT, supervision with SLP. 8. Estimated rehab length of stay to reach the above functional goals is: 9-14 days. 9. Anticipated D/C setting: Home 10. Anticipated post D/C treatments: HH therapy and Home excercise program 11. Overall Rehab/Functional Prognosis: good  RECOMMENDATIONS: This patient's condition is appropriate for continued rehabilitative care in the following setting: CIR when medically appropriate Patient has agreed to participate in recommended program. Potentially Note that insurance prior authorization may be required for reimbursement for recommended care.  Comment: Rehab Admissions Coordinator to follow up.   I have personally performed a face to face diagnostic evaluation, including, but not limited to relevant history and physical exam findings, of this patient and developed relevant assessment and plan.  Additionally, I have reviewed and concur with the physician assistant's documentation above.   Maryla Morrow, MD, ABPMR Jacquelynn Cree, PA-C 06/28/2018        Revision History                             Routing History

## 2018-06-30 NOTE — Care Management Important Message (Signed)
Important Message  Patient Details  Name: Craig [name] MRN: [id number] Date of Birth: [birth year]   Medicare Important Message Given:  Yes    Dorena Bodo 06/30/2018, 3:05 PM

## 2018-06-30 NOTE — Progress Notes (Signed)
Report called to [facility] on inpatient rehab. IV Saline locked for transport. All belonging accounted for according to patient. Neuro status unchanged, no distress, remains slightly confused with repetitive questions. Drsing with no further drainage and intact.  Gabriel Cirri RN

## 2018-06-30 NOTE — Discharge Summary (Signed)
Physician Discharge Summary  Patient ID: Craig [name] MRN: [id number] DOB/AGE: [birth year] 84 y.o.  Admit date: 06/23/2018 Discharge date: 06/30/2018  Admission Diagnoses:  Discharge Diagnoses:  Principal Problem:   Bilateral subdural hematomas (HCC) Active Problems:   Status post coronary artery stent placement   Subdural hematoma (HCC)   SDH (subdural hematoma) (HCC)   Stage 3 chronic kidney disease (HCC)   Benign essential HTN   Cognitive deficit as late effect of traumatic brain injury (HCC)   Traumatic brain injury with loss of consciousness (HCC)   Coronary artery disease involving native artery of transplanted heart without angina pectoris   Discharged Condition: stable  Hospital Course: Craig [name] an 84 year old Caucasian male, with past medical history significant for CAD with inferior wall STEMI on 05/30/2018, status post PCI with DES to D1 and EF of 45-50%. Patient was doing well post PCI, but did have residual disease and another PCI was planned for the 80% blockage of the RCA.  Patient was admitted with subdural hematoma following repeated falls at home.  Apparently, patient was on aspirin and Brilinta prior to the falls.  Patient was admitted for further assessment and management.  Cardiology and neurosurgery teams were consulted to assist with patient's management.  Aspirin and Brilinta were discontinued on admission.  Patient underwent bilateral bur hole craniectomy on 06/27/2018.  Patient has done well postop.  Neurosurgical team has cleared patient for discharge.  The plan is to discharge patient to a rehab facility if patient is agreeable.  Patient's aspirin has been restarted.  Brilinta still on hold.  As per cardiology team's advice, Plavix should be substituted for aspirin when deemed okay.    Large bilateral subdural hematoma POD #3 post bur hole craniectomy for evacuation on 06/27/2018 by neurosurgery: -Patient was on aspirin and Brilinta prior to  presentation for status post recent PCI with stent placement. -Patient was admitted following a fall with subsequent subdural acute hematoma. -Cardiology and neurosurgery team assisted with patient's management. -Aspirin and Brilinta were held on presentation. -Patient underwent bilateral bur hole craniectomy on 06/27/2018. -Aspirin has been restarted. -Consider changing aspirin to Plavix when deemed okay. -Patient was on IV Keppra during the hospital stay for seizure prophylaxis.  Will change Keppra to oral. -Patient will be discharged to rehab facility if patient is agreeable.  Acute blood loss anemia post bur hole craniectomy Presented with hemoglobin of 13.3 Hemoglobin dropped to 11.9 on 06/29/2018 Continue to monitor H&H No obvious sign of bleeding at this time  Coronary artery disease status post recent PCI with stenting Aspirin has been resumed Plan is to substitute Plavix for Brilinta Follow-up with cardiology team as soon as possible.  Ischemic cardiomyopathy with EF of 40 to 45% Stable.   Continue to monitor closely.    GERD Continue Protonix  Consults: cardiology and Neurosurgery team  Significant Diagnostic Studies:    Treatments:  Bilateral bur hole craniotomy  Discharge Exam: Blood pressure 140/61, pulse (!) 101, temperature 98 F (36.7 C), temperature source Oral, resp. rate (!) 22, height 5\' 10"  (1.778 m), weight 81.6 kg, SpO2 97 %.  Disposition: Discharge disposition: 62-Rehab Facility   Discharge Instructions    Diet - low sodium heart healthy   Complete by:  As directed    Increase activity slowly   Complete by:  As directed      Allergies as of 06/30/2018   No Known Allergies     Medication List    STOP taking these medications  aspirin 81 MG chewable tablet Replaced by:  aspirin 81 MG EC tablet   atorvastatin 80 MG tablet Commonly known as:  LIPITOR   nitroGLYCERIN 0.4 MG SL tablet Commonly known as:  Nitrostat   ticagrelor 90  MG Tabs tablet Commonly known as:  BRILINTA     TAKE these medications   aspirin 81 MG EC tablet Take 1 tablet (81 mg total) by mouth daily. Start taking on:  July 01, 2018 Replaces:  aspirin 81 MG chewable tablet   docusate sodium 100 MG capsule Commonly known as:  COLACE Take 1 capsule (100 mg total) by mouth 2 (two) times daily.   levETIRAcetam 500 MG tablet Commonly known as:  Keppra Take 1 tablet (500 mg total) by mouth 2 (two) times daily.   pantoprazole 40 MG tablet Commonly known as:  PROTONIX Take 1 tablet (40 mg total) by mouth at bedtime.        SignedBarnetta Chapel 06/30/2018, 12:26 PM

## 2018-06-30 NOTE — H&P (Signed)
Physical Medicine and Rehabilitation Admission H&P        Chief Complaint  Patient presents with  . Functional decline due to SDH      HPI:  Craig [name] is an 84 year old male with history of CKD, HTN, ICM/CAD s/p STEMI with PCI/DES with residual disease RCA who was discharged to home on 06/01/2018.  Patient returned to his home but had several falls and starting around 06/19/2018 had some altered mental status.  This worsened and Craig [name] presented to the ER on 06/23/2018 CT head revealed large large bilateral SDH of differing ages with mass-effect on superior frontal and parietal lobes.  Serial CT of head recommended by neurosurgery and DAPT held.  Cardiology consulted and cleared patient for surgery.  Craig [name] underwent bilateral bur hole evacuation of subacute SDH on 06/27/2018 by Dr. Wynetta Emery.  Most recent CT head showed decrease in size of bilateral SDH with moderate bilateral  pneumocephalus.  Postop patient continue have issues with confusion, poor safety awareness and unsteady gait.  CIR recommended due to functional decline  Patient feels like his memory has not been so good since Craig [name] was cutting down a tree about 5 years ago and a branch hit him in the head Craig [name] did not seek medical attention     Review of Systems  Constitutional: Negative for chills and fever.  Eyes: Negative for blurred vision and double vision.  Respiratory: Negative for cough, shortness of breath and stridor.   Cardiovascular: Negative for chest pain, palpitations and leg swelling.  Gastrointestinal: Negative for constipation, heartburn and nausea.  Genitourinary: Negative for dysuria and urgency.  Skin: Negative for itching and rash.  Neurological: Negative for dizziness.            Past Medical History:  Diagnosis Date  . CAD (coronary artery disease)      a.  inferior STEMI s/p DES to D1 with residual disease - planned PCI to RCA, EF 45-50%.  . CKD (chronic kidney disease), stage II    . Hyperlipidemia, mild     . Hypertension    . Ischemic cardiomyopathy    . Kidney stones    . Pre-diabetes    . RBBB    . Thrombocytopenia (HCC)    . Tobacco abuse             Past Surgical History:  Procedure Laterality Date  . BURR HOLE N/A 06/27/2018    Procedure: Craig [name];  Surgeon: Donalee Citrin, MD;  Location: [facility] OR;  Service: Neurosurgery;  Laterality: N/A;  . CORONARY/GRAFT ACUTE MI REVASCULARIZATION N/A 05/30/2018    Procedure: CORONARY/GRAFT ACUTE MI REVASCULARIZATION;  Surgeon: Lyn Records, MD;  Location: [facility] INVASIVE CV LAB;  Service: Cardiovascular;  Laterality: N/A;  . LEFT HEART CATH AND CORONARY ANGIOGRAPHY N/A 05/30/2018    Procedure: LEFT HEART CATH AND CORONARY ANGIOGRAPHY;  Surgeon: Lyn Records, MD;  Location: [facility] INVASIVE CV LAB;  Service: Cardiovascular;  Laterality: N/A;           Family History  Problem Relation Age of Onset  . Hyperlipidemia Father    . Hypertension Father        Social History:  Married. Independent PTA? 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 Craig [name] reports that Craig [name] quit smoking about 60 years ago. His smokeless tobacco use includes chew. Craig [name] reports that Craig [name] does not drink alcohol or use drugs.      Allergies: No Known Allergies  Medications Prior to Admission  Medication Sig Dispense Refill  . aspirin 81 MG chewable tablet Chew 1 tablet (81 mg total) by mouth daily.      . nitroGLYCERIN (NITROSTAT) 0.4 MG SL tablet Place 1 tablet (0.4 mg total) under the tongue every 5 (five) minutes as needed. (Patient taking differently: Place 0.4 mg under the tongue every 5 (five) minutes as needed for chest pain. ) 25 tablet 2  . ticagrelor (BRILINTA) 90 MG TABS tablet Take 1 tablet (90 mg total) by mouth 2 (two) times daily. 180 tablet 2  . atorvastatin (LIPITOR) 80 MG tablet Take 1 tablet (80 mg total) by mouth daily at 6 PM. (Patient not taking: Reported on 06/23/2018) 30 tablet 3      Drug Regimen Review  Drug regimen was reviewed and remains  appropriate with no significant issues identified   Home: Home Living Family/patient expects to be discharged to:: Private residence Living Arrangements: Spouse/significant other Available Help at Discharge: Family, Available 24 hours/day Type of Home: House Home Access: Stairs to enter Secretary/administrator of Steps: 2 Home Layout: One level Bathroom Shower/Tub: Engineer, manufacturing systems: Standard Bathroom Accessibility: Yes Home Equipment: None   Functional History: Prior Function Level of Independence: Independent Comments: pt dtr reports that this functional decline is "night and day" and Craig [name] was totally ind and driving prior to incident   Functional Status:  Mobility: Bed Mobility Overal bed mobility: Needs Assistance Bed Mobility: Supine to Sit Supine to sit: Min guard General bed mobility comments: min guard for safety as patient impulsively quick not paying attention to lines or blankets that Craig [name] could get caught up on Transfers Overall transfer level: Needs assistance Equipment used: None Transfer via Lift Equipment: Stedy Transfers: Sit to/from [employer] to Stand: Min guard General transfer comment: Min Guard A for safety Ambulation/Gait Ambulation/Gait assistance: Editor, commissioning (Feet): 200 Feet Assistive device: None Gait Pattern/deviations: Step-through pattern, Decreased stride length General Gait Details: pt safer without RW than with. Pt cont to be easily distracted causing episode of mild instability, max directional verbal cues for halway navigation however was able to find room with minimal cuing Gait velocity: slow Gait velocity interpretation: 1.31 - 2.62 ft/sec, indicative of limited community ambulator Stairs: Yes Stairs assistance: Min assist Stair Management: Step to pattern(HHA for handrail as patient with no HR at home) Number of Stairs: 2 General stair comments: minA for safety due to lines   ADL: ADL Overall ADL's : Needs  assistance/impaired Eating/Feeding: Supervision/ safety, Set up, Sitting Eating/Feeding Details (indicate cue type and reason): Pt self feeding breakfast upon arrival.  Grooming: Minimal assistance, Sitting Grooming Details (indicate cue type and reason): cleaning and putting in dentures Upper Body Bathing: Maximal assistance, Sitting, Cueing for compensatory techniques, Cueing for sequencing, Cueing for safety Lower Body Bathing: Maximal assistance, Sit to/from stand, Cueing for safety, Cueing for sequencing, Cueing for compensatory techniques Upper Body Dressing : Maximal assistance, Sitting, Cueing for sequencing, Cueing for compensatory techniques, Cueing for UE precautions Lower Body Dressing: Maximal assistance, Sit to/from stand, Cueing for compensatory techniques, Cueing for sequencing, Cueing for safety, Sitting/lateral leans Lower Body Dressing Details (indicate cue type and reason): to pull up sock while seated in recliner, needing guarding when leaning over then cues to use figure 4 position; pt then needing max A due to gen weakness in RUE, tone in LUE Toilet Transfer: Minimal assistance, Moderate assistance, +2 for safety/equipment, Ambulation, RW(simulated to recliner) Toilet Transfer Details (indicate cue type and  reason): Min-Mod A for balance and RW management.  Toileting- Clothing Manipulation and Hygiene: Maximal assistance, Sit to/from stand, Sitting/lateral lean Tub/ Shower Transfer: Maximal assistance, Shower seat, Rolling walker, Ambulation Functional mobility during ADLs: Minimal assistance, Moderate assistance, +2 for physical assistance, +2 for safety/equipment, Rolling walker General ADL Comments: Focused session on cognition to challenge memory, problem solving, and awareness. Pt performing trail making task and completing Mini-Cog screening.    Cognition: Cognition Overall Cognitive Status: Impaired/Different from baseline Orientation Level: Oriented to person,  Oriented to place, Oriented to situation Cognition Arousal/Alertness: Awake/alert Behavior During Therapy: Impulsive Overall Cognitive Status: Impaired/Different from baseline Area of Impairment: Orientation, Attention, Memory, Following commands, Safety/judgement, Problem solving Orientation Level: Disoriented to, Time Current Attention Level: Sustained Memory: Decreased short-term memory Following Commands: Follows one step commands with increased time, Follows one step commands consistently, Follows multi-step commands with increased time Safety/Judgement: Decreased awareness of safety, Decreased awareness of deficits Awareness: Emergent Problem Solving: Difficulty sequencing, Requires verbal cues, Requires tactile cues General Comments: Administered Mini-Cog Screening to assess memory, languange, visual-motor, and exective functioning. Pt scored a 1 out of 5 indicating meaningful cognitive impairment . Pt unable to recall ST memory words (version 1). Pt placing numbers in correct positions on clock, but inappropiately placed hands (both clock hands pointing to number 11).  Pt asking reassuring questions during test such as "do you want me to start at the 12?" Pt performing a trail making task to locate the exit. Provided him three step directions. Pt initially confused and required Min cues to realize confusion; pt then returning to his room to restart. Pt abel to recall his room number throughout task. During this session, pt demosntrating increased problem solving, awareness of deficits, and memory. However, continues to present with deficits impacting his safety with ADLs and IADLs.      Physical Exam: Blood pressure 126/64, pulse 72, temperature 98.3 F (36.8 C), temperature source Oral, resp. rate 18, height  (1.778 m), weight 81.6 kg, SpO2 97 %. Physical Exam  Nursing note and vitals reviewed. Constitutional: Craig [name] is oriented to person, place, and time. Craig [name] appears well-developed  and well-nourished.  HENT:  Honey comb dressings on scalp.   Respiratory: No respiratory distress.  Lungs clear, no wheezes or rales Heart regular rate and rhythm no rubs murmurs or extra sounds Abdomen positive bowel sounds soft nontender palpation Extremities minimal pedal edema Skin without breakdown on heels, no rash  Neurological: Craig [name] is alert and oriented to person, place, and time.  Speech clear. Able to answer orientation questions. Craig [name] thought that today was 24th but able to recall month and year with minimal cues. Craig [name] is able to follow simple motor commands.   Motor strength is 5/5 bilateral deltoid bicep tricep grip hip flexor knee extensor ankle dorsiflexor Sensation intact light touch bilateral upper and lower limbs Psychiatric: Craig [name] has a normal mood and affect. His behavior is normal. Thought content normal.   Patient remembers 3/3 unrelated objects after 5-minute delay  Lab Results Last 48 Hours        Results for orders placed or performed during the hospital encounter of 06/23/18 (from the past 48 hour(s))  Basic metabolic panel     Status: Abnormal    Collection Time: 06/29/18  4:13 AM  Result Value Ref Range    Sodium 135 135 - 145 mmol/L    Potassium 3.9 3.5 - 5.1 mmol/L    Chloride 105 98 - 111 mmol/L    CO2 21 (L)  22 - 32 mmol/L    Glucose, Bld 113 (H) 70 - 99 mg/dL    BUN 23 8 - 23 mg/dL    Creatinine, Ser 0.981.03 0.61 - 1.24 mg/dL    Calcium 8.5 (L) 8.9 - 10.3 mg/dL    GFR calc non Af Amer >60 >60 mL/min    GFR calc Af Amer >60 >60 mL/min    Anion gap 9 5 - 15      Comment: Performed at [facility], [address] [place], [place] [address]  CBC     Status: Abnormal    Collection Time: 06/29/18  4:13 AM  Result Value Ref Range    WBC 7.3 4.0 - 10.5 K/uL    RBC 4.05 (L) 4.22 - 5.81 MIL/uL    Hemoglobin 11.9 (L) 13.0 - 17.0 g/dL    HCT 78.235.3 (L) 95.639.0 - 52.0 %    MCV 87.2 80.0 - 100.0 fL    MCH 29.4 26.0 - 34.0 pg    MCHC 33.7 30.0 - 36.0 g/dL    RDW  21.312.1 08.611.5 - 57.815.5 %    Platelets 137 (L) 150 - 400 K/uL    nRBC 0.0 0.0 - 0.2 %      Comment: Performed at [facility], [address] [place], [place] [address]  Magnesium     Status: None    Collection Time: 06/29/18  4:13 AM  Result Value Ref Range    Magnesium 2.0 1.7 - 2.4 mg/dL      Comment: Performed at [facility], [address] [place], [place] [address]  CBC     Status: Abnormal    Collection Time: 06/30/18  4:29 AM  Result Value Ref Range    WBC 7.2 4.0 - 10.5 K/uL    RBC 3.90 (L) 4.22 - 5.81 MIL/uL    Hemoglobin 11.4 (L) 13.0 - 17.0 g/dL    HCT 13.233.8 (L) 44.039.0 - 52.0 %    MCV 86.7 80.0 - 100.0 fL    MCH 29.2 26.0 - 34.0 pg    MCHC 33.7 30.0 - 36.0 g/dL    RDW 10.212.0 72.511.5 - 36.615.5 %    Platelets 135 (L) 150 - 400 K/uL    nRBC 0.0 0.0 - 0.2 %      Comment: Performed at [facility], [address] [place], [place] [address]      Imaging Results (Last 48 hours)  No results found.           Medical Problem List and Plan: 1.    Traumatic brain injury secondary to fall with subdural hematoma 2.  Antithrombotics: -DVT/anticoagulation:  Mechanical: Sequential compression devices, below knee Bilateral lower extremities             -antiplatelet therapy: ASA, Brilinta on hold, will need neurosurgery input in terms of changing aspirin to Plavix 3. Pain Management: N/A 4. Mood: LCSW to follow for evaluation and support.              -antipsychotic agents: N/A 5. Neuropsych: This patient is not fully capable of making decisions on his own behalf. 6. Skin/Wound Care: Monitor wound for healing.  7. Fluids/Electrolytes/Nutrition: Monitor I/O. Check lytes in am.  8. ICM/CAD s/p STEMI/DES:  On low dose ASA. Will continue to hold lipitor (was not taking at home?) 9.   Intermittent hypoxia: Educated/encourage IS. Check CXR  as well as daily weights. Monitor for signs of overload.  10. CKD?: Will monitor renal status with serial checks for changes. Avoid  nephrotoxic drugs. 11.   ABLA:  Recheck CBC in am. Monitor for stability.  12. Prediabetes:  FBS 120-130 range. Will monitor BS for X couple of days for trends.  13. Seizure prophylaxis: Continue Keppra bid.   No seizure activity   Post Admission Physician Evaluation: 1. Functional deficits secondary  to a medic brain injury. 2. Patient admitted to receive collaborative, interdisciplinary care between the physiatrist, rehab nursing staff, and therapy team. 3. Patient's level of medical complexity and substantial therapy needs in context of that medical necessity cannot be provided at a lesser intensity of care. 4. Patient has experienced substantial functional loss from his/her baseline.  Judging by the patient's diagnosis, physical exam, and functional history, the patient has potential for functional progress which will result in measurable gains while on inpatient rehab.  These gains will be of substantial and practical use upon discharge in facilitating mobility and self-care at the household level. 5. Physiatrist will provide 24 hour management of medical needs as well as oversight of the therapy plan/treatment and provide guidance as appropriate regarding the interaction of the two. 6. 24 hour rehab nursing will assist in the management of  bladder management, bowel management, safety, skin/wound care, disease management, medication administration, pain management and patient education  and help integrate therapy concepts, techniques,education, etc. 7. PT will assess and treat for: DME instruction, Gait training, Stair training, Functional mobility training, Therapeutic activities, Therapeutic exercise, Balance training, Cognitive remediation, Patient/family education.  Goals are: supervision. 8. OT will assess and treat for Self-care/ADL training, DME and/or AE instruction, Therapeutic activities, Balance training, Therapeutic exercise, Cognitive remediation/compensation, Neuromuscular education,  Patient/family education.  Goals are: supervision.  9. SLP will assess and treat for attention, concentration, problem-solving, thought organization, medication management  .  Goals are: supervision. 10. Case Management and Social Worker will assess and treat for psychological issues and discharge planning. 11. Team conference will be held weekly to assess progress toward goals and to determine barriers to discharge. 12.  Patient will receive at least 3 hours of therapy per day at least 5 days per week. 13. ELOS and Prognosis: 9-13d excellent  "I have personally performed a face to face diagnostic evaluation of this patient.  Additionally, I have reviewed and concur with the physician assistant's documentation above."  Erick Colace M.D. [facility] FAAPM&R (Sports Med, Neuromuscular Med) Diplomate Am Board of Electrodiagnostic Med      Jacquelynn Cree, PA-C 06/30/2018

## 2018-06-30 NOTE — Progress Notes (Signed)
Subjective: Patient reports doing well, no issues overnight. Patient states that he really wants to go home.  Objective: Vital signs in last 24 hours: Temp:  [97.6 F (36.4 C)-98.4 F (36.9 C)] 98.3 F (36.8 C) (03/26 0748) Pulse Rate:  [25-79] 72 (03/26 0748) Resp:  [11-18] 18 (03/26 0748) BP: (112-127)/(49-71) 126/64 (03/26 0748) SpO2:  [90 %-100 %] 97 % (03/26 0748)  Intake/Output from previous day: 03/25 0701 - 03/26 0700 In: 940 [P.O.:240; I.V.:700] Out: 575 [Urine:575] Intake/Output this shift: No intake/output data recorded.  Neurologic: Grossly normal  Lab Results: Lab Results  Component Value Date   WBC 7.2 06/30/2018   HGB 11.4 (L) 06/30/2018   HCT 33.8 (L) 06/30/2018   MCV 86.7 06/30/2018   PLT 135 (L) 06/30/2018   Lab Results  Component Value Date   INR 1.0 06/23/2018   BMET Lab Results  Component Value Date   NA 135 06/29/2018   K 3.9 06/29/2018   CL 105 06/29/2018   CO2 21 (L) 06/29/2018   GLUCOSE 113 (H) 06/29/2018   BUN 23 06/29/2018   CREATININE 1.03 06/29/2018   CALCIUM 8.5 (L) 06/29/2018    Studies/Results: No results found.  Assessment/Plan: Doing well, ok to discharge home from neurosurgery standpoint.    LOS: 6 days    Tiana Loft [facility] 06/30/2018, 8:28 AM

## 2018-07-01 ENCOUNTER — Inpatient Hospital Stay (HOSPITAL_COMMUNITY): Payer: Medicare Other | Admitting: Physical Therapy

## 2018-07-01 ENCOUNTER — Inpatient Hospital Stay (HOSPITAL_COMMUNITY): Payer: Medicare Other

## 2018-07-01 ENCOUNTER — Inpatient Hospital Stay (HOSPITAL_COMMUNITY): Payer: Medicare Other | Admitting: Speech Pathology

## 2018-07-01 DIAGNOSIS — I10 Essential (primary) hypertension: Secondary | ICD-10-CM

## 2018-07-01 DIAGNOSIS — R609 Edema, unspecified: Secondary | ICD-10-CM

## 2018-07-01 DIAGNOSIS — D62 Acute posthemorrhagic anemia: Secondary | ICD-10-CM

## 2018-07-01 DIAGNOSIS — S069X9S Unspecified intracranial injury with loss of consciousness of unspecified duration, sequela: Secondary | ICD-10-CM

## 2018-07-01 LAB — COMPREHENSIVE METABOLIC PANEL
ALT: 17 U/L (ref 0–44)
AST: 25 U/L (ref 15–41)
Albumin: 3.4 g/dL — ABNORMAL LOW (ref 3.5–5.0)
Alkaline Phosphatase: 80 U/L (ref 38–126)
Anion gap: 12 (ref 5–15)
BUN: 13 mg/dL (ref 8–23)
CHLORIDE: 98 mmol/L (ref 98–111)
CO2: 25 mmol/L (ref 22–32)
Calcium: 9.2 mg/dL (ref 8.9–10.3)
Creatinine, Ser: 1.17 mg/dL (ref 0.61–1.24)
GFR calc Af Amer: >60 mL/min (ref >60)
GFR calc non Af Amer: 58 mL/min — ABNORMAL LOW (ref >60)
Glucose, Bld: 123 mg/dL — ABNORMAL HIGH (ref 70–99)
Potassium: 4 mmol/L (ref 3.5–5.1)
Sodium: 135 mmol/L (ref 135–145)
Total Bilirubin: 1.4 mg/dL — ABNORMAL HIGH (ref 0.3–1.2)
Total Protein: 6.2 g/dL — ABNORMAL LOW (ref 6.5–8.1)

## 2018-07-01 LAB — GLUCOSE, CAPILLARY
Glucose-Capillary: 109 mg/dL — ABNORMAL HIGH (ref 70–99)
Glucose-Capillary: 120 mg/dL — ABNORMAL HIGH (ref 70–99)
Glucose-Capillary: 129 mg/dL — ABNORMAL HIGH (ref 70–99)
Glucose-Capillary: 165 mg/dL — ABNORMAL HIGH (ref 70–99)

## 2018-07-01 LAB — CBC WITH DIFFERENTIAL/PLATELET
Abs Immature Granulocytes: 0.04 10*3/uL (ref 0.00–0.07)
Basophils Absolute: 0 10*3/uL (ref 0.0–0.1)
Basophils Relative: 0 %
Eosinophils Absolute: 0.2 10*3/uL (ref 0.0–0.5)
Eosinophils Relative: 2 %
HCT: 36.3 % — ABNORMAL LOW (ref 39.0–52.0)
Hemoglobin: 12.8 g/dL — ABNORMAL LOW (ref 13.0–17.0)
Immature Granulocytes: 0 %
Lymphocytes Relative: 13 %
Lymphs Abs: 1.2 10*3/uL (ref 0.7–4.0)
MCH: 30.5 pg (ref 26.0–34.0)
MCHC: 35.3 g/dL (ref 30.0–36.0)
MCV: 86.4 fL (ref 80.0–100.0)
Monocytes Absolute: 0.6 10*3/uL (ref 0.1–1.0)
Monocytes Relative: 6 %
NEUTROS PCT: 79 %
Neutro Abs: 7.5 10*3/uL (ref 1.7–7.7)
PLATELETS: 142 10*3/uL — AB (ref 150–400)
RBC: 4.2 MIL/uL — ABNORMAL LOW (ref 4.22–5.81)
RDW: 12.1 % (ref 11.5–15.5)
WBC: 9.4 10*3/uL (ref 4.0–10.5)
nRBC: 0 % (ref 0.0–0.2)

## 2018-07-01 NOTE — IPOC Note (Signed)
Overall Plan of Care [facility]) Patient Details Name: Craig [name] MRN: [id number] DOB: 1934/09/10  Admitting Diagnosis: Traumatic brain injury with loss of consciousness [facility])  Hospital Problems: Principal Problem:   Traumatic brain injury with loss of consciousness (HCC) Active Problems:   Status post coronary artery stent placement   Bilateral subdural hematomas (HCC)   Stage 3 chronic kidney disease (HCC)   Benign essential HTN   Cognitive deficit as late effect of traumatic brain injury (HCC)   Subdural hemorrhage, postinjury (HCC)     Functional Problem List: Nursing Safety, Skin Integrity, Endurance, Perception  PT Balance, Skin Integrity, Pain, Behavior, Endurance, Perception, Safety, Motor, Sensory  OT Balance, Behavior, Cognition, Endurance, Motor, Perception, Safety, Sensory, Vision  SLP    TR         Basic ADL's: OT Grooming, Eating, Bathing, Dressing, Toileting     Advanced  ADL's: OT       Transfers: PT Bed Mobility, Bed to Chair, [employer], Set designer, Civil Service fast streamer, Research scientist (life sciences): PT Ambulation, Psychologist, prison and probation services, Stairs     Additional Impairments: OT Fuctional Use of Upper Extremity  SLP Social Cognition   Social Interaction, Problem Solving, Memory, Attention, Awareness  TR      Anticipated Outcomes Item Anticipated Outcome  Self Feeding S  Swallowing      Basic self-care  S  Toileting  S   Bathroom Transfers S  Bowel/Bladder  Mod I assist  Transfers  supervision with LRAD  Locomotion  supervision with LRAD  Communication     Cognition  Min A   Pain  < 3  Safety/Judgment  Supervision   Therapy Plan: PT Intensity: Minimum of 1-2 x/day ,45 to 90 minutes PT Frequency: 5 out of 7 days PT Duration Estimated Length of Stay: 10-12 days OT Intensity: Minimum of 1-2 x/day, 45 to 90 minutes OT Frequency: 5 out of 7 days OT Duration/Estimated Length of Stay: 12-14 SLP Intensity: Minumum of 1-2 x/day, 30 to 90  minutes SLP Frequency: 3 to 5 out of 7 days SLP Duration/Estimated Length of Stay: 12-14 days     Team Interventions: Nursing Interventions Patient/Family Education, Disease Management/Prevention, Skin Care/Wound Management, Medication Management, Cognitive Remediation/Compensation  PT interventions Ambulation/gait training, Cognitive remediation/compensation, Discharge planning, DME/adaptive equipment instruction, Functional mobility training, Pain management, Psychosocial support, Splinting/orthotics, Therapeutic Activities, UE/LE Strength taining/ROM, Visual/perceptual remediation/compensation, Wheelchair propulsion/positioning, UE/LE Coordination activities, Therapeutic Exercise, Stair training, Skin care/wound management, Patient/family education, Neuromuscular re-education, Functional electrical stimulation, Disease management/prevention, Firefighter, Warden/ranger  OT Interventions Warden/ranger, Community reintegration, Disease mangement/prevention, Equities trader education, Self Care/advanced ADL retraining, Therapeutic Exercise, UE/LE Coordination activities, Wheelchair propulsion/positioning, Discharge planning, Cognitive remediation/compensation, DME/adaptive equipment instruction, Functional mobility training, Pain management, Psychosocial support, Skin care/wound managment, Therapeutic Activities, UE/LE Strength taining/ROM, Visual/perceptual remediation/compensation  SLP Interventions Cognitive remediation/compensation, Cueing hierarchy, Functional tasks, Patient/family education, Therapeutic Activities, Environmental controls, Internal/external aids  TR Interventions    SW/CM Interventions Discharge Planning, Psychosocial Support, Patient/Family Education   Barriers to Discharge MD  Medical stability  Nursing      PT Decreased caregiver support unsure if wife can provide supervision  OT Inaccessible home environment, Decreased caregiver  support    SLP      SW       Team Discharge Planning: Destination: PT-Home ,OT- Home , SLP-Home Projected Follow-up: PT-Home health PT, 24 hour supervision/assistance, OT-  Home health OT, SLP-24 hour supervision/assistance, Home Health SLP Projected Equipment Needs: PT-To be determined, OT- 3 in 1 bedside  comode, Tub/shower bench, SLP-None recommended by SLP Equipment Details: PT- , OT-  Patient/family involved in discharge planning: PT- Patient unable/family or caregiver not available,  OT-Patient, SLP-Patient  MD ELOS: 10-13 days Medical Rehab Prognosis:  Excellent Assessment: The patient has been admitted for CIR therapies with the diagnosis of TBI. The team will be addressing functional mobility, strength, stamina, balance, safety, adaptive techniques and equipment, self-care, bowel and bladder mgt, patient and caregiver education, NMR, cognitive perceptual rx, communication, community reentry. Goals have been set at supervision with mobility and self-care and min assist with cognition.    Ranelle Oyster, MD, FAAPMR      See Team Conference Notes for weekly updates to the plan of care
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
## 2018-07-01 NOTE — Evaluation (Signed)
Occupational Therapy Assessment and Plan  Patient Details  Name: Craig [name] MRN: [id number] Date of Birth: [birth year]  OT Diagnosis: abnormal posture, apraxia, cognitive deficits, disturbance of vision, hemiplegia affecting non-dominant side, muscle weakness (generalized) and coordination disorder Rehab Potential:   ELOS: 12-14   Today's Date: 07/01/2018 OT Individual Time: 3532-9924 OT Individual Time Calculation (min): 75 min     Problem List:  Patient Active Problem List   Diagnosis Date Noted  . Subdural hemorrhage, postinjury ([place]) 06/30/2018  . Stage 3 chronic kidney disease ([place])   . Benign essential HTN   . Cognitive deficit as late effect of traumatic brain injury ([place])   . Traumatic brain injury with loss of consciousness ([place])   . Coronary artery disease involving native artery of transplanted heart without angina pectoris   . Subdural hematoma ([place]) 06/27/2018  . SDH (subdural hematoma) ([place]) 06/27/2018  . Bilateral subdural hematomas ([place]) 06/24/2018  . Tobacco use 06/01/2018  . Status post coronary artery stent placement   . Acute ST elevation myocardial infarction (STEMI) of inferolateral wall ([place]) 05/30/2018  . Essential hypertension 05/30/2018  . Hyperlipidemia LDL goal <70 05/30/2018  . Acute ST elevation myocardial infarction (STEMI) of lateral wall ([place]) 05/30/2018    Past Medical History:  Past Medical History:  Diagnosis Date  . CAD (coronary artery disease)    a.  inferior STEMI s/p DES to D1 with residual disease - planned PCI to RCA, EF 45-50%.  . CKD (chronic kidney disease), stage II   . Hyperlipidemia, mild   . Hypertension   . Ischemic cardiomyopathy   . Kidney stones   . Pre-diabetes   . RBBB   . Thrombocytopenia ([place])   . Tobacco abuse    Past Surgical History:  Past Surgical History:  Procedure Laterality Date  . BURR HOLE N/A 06/27/2018   Procedure: Haskell Flirt;  Surgeon: Kary Kos, MD;  Location: [facility];  Service:  Neurosurgery;  Laterality: N/A;  . CORONARY/GRAFT ACUTE MI REVASCULARIZATION N/A 05/30/2018   Procedure: CORONARY/GRAFT ACUTE MI REVASCULARIZATION;  Surgeon: Belva Crome, MD;  Location: [facility];  Service: Cardiovascular;  Laterality: N/A;  . LEFT HEART CATH AND CORONARY ANGIOGRAPHY N/A 05/30/2018   Procedure: LEFT HEART CATH AND CORONARY ANGIOGRAPHY;  Surgeon: Belva Crome, MD;  Location: [facility];  Service: Cardiovascular;  Laterality: N/A;    Assessment & Plan Clinical Impression: Craig [name] is a 84 y.o. male with medical history significant of CAD had inferior wall STEMI on 05/30/2018, underwent PCI with DES to D1.  EF 45-50%.  Did have residual disease and ultimately was planned to do another PCI, this time to the 80% blockage of the RCA, this originally planned for today but was deferred as an elective procedure for 4 weeks due to the coronavirus pandemic. [place] out of bed on Sunday.  Then had fall on Tuesday landing on his left side and arm, unsure if he hit his head.  He has been on Brilinta and ASA following the STEMI and PCI in Feb. Since those falls he has had progressive mental and functional decline for the past 2 days.  He was independent prior to this, now with slowed mental status, needing maximal assist with walking. Pt under went burrhole procedure on 3/23 for bilat SDH evacuation.   Patient currently requires min- max with basic self-care skills secondary to muscle weakness, decreased cardiorespiratoy endurance, motor apraxia, decreased coordination and decreased motor planning, decreased visual perceptual skills,  decreased initiation, decreased attention, decreased awareness, decreased problem solving, decreased safety awareness, decreased memory and delayed processing and decreased sitting balance, decreased standing balance, decreased postural control, hemiplegia and decreased balance strategies.  Prior to hospitalization, patient could complete BADL with  modified independent .  Patient will benefit from skilled intervention to decrease level of assist with basic self-care skills prior to discharge home with care partner.  Anticipate patient will require 24 hour supervision and follow up home health.  OT - End of Session Activity Tolerance: Tolerates 30+ min activity with multiple rests Endurance Deficit: Yes OT Assessment Rehab Potential (ACUTE ONLY): Good OT Barriers to Discharge: Inaccessible home environment;Decreased caregiver support OT Patient demonstrates impairments in the following area(s): Balance;Behavior;Cognition;Endurance;Motor;Perception;Safety;Sensory;Vision OT Basic ADL's Functional Problem(s): Grooming;Eating;Bathing;Dressing;Toileting OT Transfers Functional Problem(s): Toilet;Tub/Shower OT Additional Impairment(s): Fuctional Use of Upper Extremity OT Plan OT Intensity: Minimum of 1-2 x/day, 45 to 90 minutes OT Frequency: 5 out of 7 days OT Duration/Estimated Length of Stay: 12-14 OT Treatment/Interventions: Balance/vestibular training;Community reintegration;Disease mangement/prevention;Patient/family education;Self Care/advanced ADL retraining;Therapeutic Exercise;UE/LE Coordination activities;Wheelchair propulsion/positioning;Discharge planning;Cognitive remediation/compensation;DME/adaptive equipment instruction;Functional mobility training;Pain management;Psychosocial support;Skin care/wound managment;Therapeutic Activities;UE/LE Strength taining/ROM;Visual/perceptual remediation/compensation OT Self Feeding Anticipated Outcome(s): S OT Basic Self-Care Anticipated Outcome(s): S OT Toileting Anticipated Outcome(s): S OT Bathroom Transfers Anticipated Outcome(s): S OT Recommendation Patient destination: Home Follow Up Recommendations: Home health OT Equipment Recommended: 3 in 1 bedside comode;Tub/shower bench   Skilled Therapeutic Intervention 1;1. Pt received in bed and is educated on role/purpose of OT, CIR,  ELOS and POC. Pt repeats same question of when he his going home, if this OT is taking him home, and what is the purpose of therapy since nothing has changed. Near end of session pt able to state things (ADL) are harder than he thought they would be. Pt requires MAX A for dressing/toileting and MOD A for bathing with thoroughness d/t small rhythmical movements. Pt demo difficulty problem solving alternative ways of dressing/sequencig dressing. Exited session with pt stand pivot EOB<><>toilet<>w/c with MIN A. Pt left in bed, clal light in reach and all needs met  OT Evaluation Precautions/Restrictions  Precautions Precautions: Fall Precaution Comments: recent burr hole surgery Restrictions Weight Bearing Restrictions: No General Chart Reviewed: Yes Family/Caregiver Present: No Vital Signs Therapy Vitals Temp: 98.5 F (36.9 C) Pulse Rate: 91 Resp: 18 BP: 137/65 Patient Position (if appropriate): Lying Oxygen Therapy SpO2: 94 % O2 Device: Room Air Pain Pain Assessment Pain Score: 0-No pain Home Living/Prior Functioning Home Living Family/patient expects to be discharged to:: Private residence Living Arrangements: Spouse/significant other Available Help at Discharge: Family, Available 24 hours/day Type of Home: House Home Access: Stairs to enter Technical brewer of Steps: 2 Home Layout: One level Bathroom Shower/Tub: Chiropodist: Standard Bathroom Accessibility: Yes Prior Function Comments: pt dtr reports that this functional decline is "night and day" and he was totally ind and driving prior to incident ADL ADL Grooming: Moderate cueing, Minimal assistance Where Assessed-Grooming: Standing at sink Upper Body Bathing: Minimal assistance Where Assessed-Upper Body Bathing: Sitting at sink Lower Body Bathing: Minimal assistance Where Assessed-Lower Body Bathing: Sitting at sink, Standing at sink Upper Body Dressing: Maximal assistance Where  Assessed-Upper Body Dressing: Sitting at sink Lower Body Dressing: Maximal assistance Where Assessed-Lower Body Dressing: Sitting at sink Toileting: Maximal assistance Where Assessed-Toileting: Glass blower/designer: Psychiatric nurse Method: Arts development officer: Energy manager Method: Unable to assess [employer] Transfer: Unable to assess Vision Baseline Vision/History: Wears glasses Wears Glasses: Reading only  Patient Visual Report: No change from baseline Vision Assessment?: Vision impaired- to be further tested in functional context Perception  Perception: Impaired Praxis Praxis: Impaired Cognition Overall Cognitive Status: Impaired/Different from baseline Arousal/Alertness: Awake/alert Orientation Level: Person Year: (2003) Month: March Day of Week: Correct Memory: Impaired Memory Impairment: Retrieval deficit;Storage deficit Immediate Memory Recall: Sock;Blue;Bed Awareness: Impaired Awareness Impairment: Intellectual impairment Problem Solving: Impaired Safety/Judgment: Impaired [employer] Scales of Cognitive Functioning: Automatic/appropriate Sensation Sensation Light Touch: Appears Intact(difficulty R/L discrimination) Coordination Gross Motor Movements are Fluid and Coordinated: Yes Fine Motor Movements are Fluid and Coordinated: No Motor  Motor Motor: Hemiplegia Mobility  Transfers Sit to Stand: Minimal Assistance - Patient > 75% Stand to Sit: Minimal Assistance - Patient > 75%  Trunk/Postural Assessment  Cervical Assessment Cervical Assessment: Exceptions to WFL(head forward) Thoracic Assessment Thoracic Assessment: Exceptions to WFL(rounded shoudler) Lumbar Assessment Lumbar Assessment: Exceptions to WFL(post pelv tilt) Postural Control Postural Control: Deficits on evaluation(delayed)  Balance Balance Balance Assessed: Yes Dynamic Sitting Balance Dynamic Sitting - Balance Support: No  upper extremity supported;Feet unsupported Dynamic Sitting - Level of Assistance: 5: Stand by assistance Dynamic Standing Balance Dynamic Standing - Level of Assistance: 4: Min assist Extremity/Trunk Assessment RUE Assessment RUE Assessment: Exceptions to [facility] General Strength Comments: generalized wekaness, slow movement LUE Assessment LUE Assessment: Exceptions to [facility] General Strength Comments: generalized weakness >RUE LUE Body System: Neuro Brunstrum levels for arm and hand: Arm;Hand Brunstrum level for arm: Stage V Relative Independence from Synergy Brunstrum level for hand: Stage VI Isolated joint movements     Refer to Care Plan for Long Term Goals  Recommendations for other services: None    Discharge Criteria: Patient will be discharged from OT if patient refuses treatment 3 consecutive times without medical reason, if treatment goals not met, if there is a change in medical status, if patient makes no progress towards goals or if patient is discharged from hospital.  The above assessment, treatment plan, treatment alternatives and goals were discussed and mutually agreed upon: by patient  Tonny Branch 07/01/2018, 8:05 AM

## 2018-07-01 NOTE — Progress Notes (Signed)
Bilateral lower extremity venous duplex completed. Preliminary results in Chart review CV Proc. [place] Garvin Ellena,RVS 07/01/2018 4:18 PM

## 2018-07-01 NOTE — H&P (Signed)
Physical Medicine and Rehabilitation Admission H&P        Chief Complaint  Patient presents with  . Functional decline due to SDH      HPI:  Craig [name] is an 84 year old male with history of CKD, HTN, ICM/CAD s/p STEMI with PCI/DES with residual disease RCA who was discharged to home on 06/01/2018.  Patient returned to his home but had several falls and starting around 06/19/2018 had some altered mental status.  This worsened and he presented to the ER on 06/23/2018 CT head revealed large large bilateral SDH of differing ages with mass-effect on superior frontal and parietal lobes.  Serial CT of head recommended by neurosurgery and DAPT held.  Cardiology consulted and cleared patient for surgery.  He underwent bilateral bur hole evacuation of subacute SDH on 06/27/2018 by Dr. cram.  Most recent CT head showed decrease in size of bilateral SDH with moderate bilateral  pneumocephalus.  Postop patient continue have issues with confusion, poor safety awareness and unsteady gait.  CIR recommended due to functional decline  Patient feels like his memory has not been so good since he was cutting down a tree about 5 years ago and a branch hit him in the head he did not seek medical attention     Review of Systems  Constitutional: Negative for chills and fever.  Eyes: Negative for blurred vision and double vision.  Respiratory: Negative for cough, shortness of breath and stridor.   Cardiovascular: Negative for chest pain, palpitations and leg swelling.  Gastrointestinal: Negative for constipation, heartburn and nausea.  Genitourinary: Negative for dysuria and urgency.  Skin: Negative for itching and rash.  Neurological: Negative for dizziness.            Past Medical History:  Diagnosis Date  . CAD (coronary artery disease)      a.  inferior STEMI s/p DES to D1 with residual disease - planned PCI to RCA, EF 45-50%.  . CKD (chronic kidney disease), stage II    . Hyperlipidemia, mild     . Hypertension    . Ischemic cardiomyopathy    . Kidney stones    . Pre-diabetes    . RBBB    . Thrombocytopenia (HCC)    . Tobacco abuse             Past Surgical History:  Procedure Laterality Date  . BURR HOLE N/A 06/27/2018    Procedure: BURR HOLES;  Surgeon: Cram, Gary, MD;  Location: [facility] OR;  Service: Neurosurgery;  Laterality: N/A;  . CORONARY/GRAFT ACUTE MI REVASCULARIZATION N/A 05/30/2018    Procedure: CORONARY/GRAFT ACUTE MI REVASCULARIZATION;  Surgeon: Smith, Henry W, MD;  Location: [facility];  Service: Cardiovascular;  Laterality: N/A;  . LEFT HEART CATH AND CORONARY ANGIOGRAPHY N/A 05/30/2018    Procedure: LEFT HEART CATH AND CORONARY ANGIOGRAPHY;  Surgeon: Smith, Henry W, MD;  Location: [facility];  Service: Cardiovascular;  Laterality: N/A;           Family History  Problem Relation Age of Onset  . Hyperlipidemia Father    . Hypertension Father        Social History:  Married. Independent PTA? 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 He reports that he quit smoking about 60 years ago. His smokeless tobacco use includes chew. He reports that he does not drink alcohol or use drugs.      Allergies: No Known Allergies              Medications Prior to Admission  Medication Sig Dispense Refill  . aspirin 81 MG chewable tablet Chew 1 tablet (81 mg total) by mouth daily.      . nitroGLYCERIN (NITROSTAT) 0.4 MG SL tablet Place 1 tablet (0.4 mg total) under the tongue every 5 (five) minutes as needed. (Patient taking differently: Place 0.4 mg under the tongue every 5 (five) minutes as needed for chest pain. ) 25 tablet 2  . ticagrelor (BRILINTA) 90 MG TABS tablet Take 1 tablet (90 mg total) by mouth 2 (two) times daily. 180 tablet 2  . atorvastatin (LIPITOR) 80 MG tablet Take 1 tablet (80 mg total) by mouth daily at 6 PM. (Patient not taking: Reported on 06/23/2018) 30 tablet 3      Drug Regimen Review  Drug regimen was reviewed and remains  appropriate with no significant issues identified   Home: Home Living Family/patient expects to be discharged to:: Private residence Living Arrangements: Spouse/significant other Available Help at Discharge: Family, Available 24 hours/day Type of Home: House Home Access: Stairs to enter Entrance Stairs-Number of Steps: 2 Home Layout: One level Bathroom Shower/Tub: Tub/shower unit Bathroom Toilet: Standard Bathroom Accessibility: Yes Home Equipment: None   Functional History: Prior Function Level of Independence: Independent Comments: pt dtr reports that this functional decline is "night and day" and he was totally ind and driving prior to incident   Functional Status:  Mobility: Bed Mobility Overal bed mobility: Needs Assistance Bed Mobility: Supine to Sit Supine to sit: Min guard General bed mobility comments: min guard for safety as patient impulsively quick not paying attention to lines or blankets that he could get caught up on Transfers Overall transfer level: Needs assistance Equipment used: None Transfer via Lift Equipment: Stedy Transfers: Sit to/from Stand Sit to Stand: Min guard General transfer comment: Min Guard A for safety Ambulation/Gait Ambulation/Gait assistance: Min assist Gait Distance (Feet): 200 Feet Assistive device: None Gait Pattern/deviations: Step-through pattern, Decreased stride length General Gait Details: pt safer without RW than with. Pt cont to be easily distracted causing episode of mild instability, max directional verbal cues for halway navigation however was able to find room with minimal cuing Gait velocity: slow Gait velocity interpretation: 1.31 - 2.62 ft/sec, indicative of limited community ambulator Stairs: Yes Stairs assistance: Min assist Stair Management: Step to pattern(HHA for handrail as patient with no HR at home) Number of Stairs: 2 General stair comments: minA for safety due to lines   ADL: ADL Overall ADL's : Needs  assistance/impaired Eating/Feeding: Supervision/ safety, Set up, Sitting Eating/Feeding Details (indicate cue type and reason): Pt self feeding breakfast upon arrival.  Grooming: Minimal assistance, Sitting Grooming Details (indicate cue type and reason): cleaning and putting in dentures Upper Body Bathing: Maximal assistance, Sitting, Cueing for compensatory techniques, Cueing for sequencing, Cueing for safety Lower Body Bathing: Maximal assistance, Sit to/from stand, Cueing for safety, Cueing for sequencing, Cueing for compensatory techniques Upper Body Dressing : Maximal assistance, Sitting, Cueing for sequencing, Cueing for compensatory techniques, Cueing for UE precautions Lower Body Dressing: Maximal assistance, Sit to/from stand, Cueing for compensatory techniques, Cueing for sequencing, Cueing for safety, Sitting/lateral leans Lower Body Dressing Details (indicate cue type and reason): to pull up sock while seated in recliner, needing guarding when leaning over then cues to use figure 4 position; pt then needing max A due to gen weakness in RUE, tone in LUE Toilet Transfer: Minimal assistance, Moderate assistance, +2 for safety/equipment, Ambulation, RW(simulated to recliner) Toilet Transfer Details (indicate cue type and   reason): Min-Mod A for balance and RW management.  Toileting- Clothing Manipulation and Hygiene: Maximal assistance, Sit to/from stand, Sitting/lateral lean Tub/ Shower Transfer: Maximal assistance, Shower seat, Rolling walker, Ambulation Functional mobility during ADLs: Minimal assistance, Moderate assistance, +2 for physical assistance, +2 for safety/equipment, Rolling walker General ADL Comments: Focused session on cognition to challenge memory, problem solving, and awareness. Pt performing trail making task and completing Mini-Cog screening.    Cognition: Cognition Overall Cognitive Status: Impaired/Different from baseline Orientation Level: Oriented to person,  Oriented to place, Oriented to situation Cognition Arousal/Alertness: Awake/alert Behavior During Therapy: Impulsive Overall Cognitive Status: Impaired/Different from baseline Area of Impairment: Orientation, Attention, Memory, Following commands, Safety/judgement, Problem solving Orientation Level: Disoriented to, Time Current Attention Level: Sustained Memory: Decreased short-term memory Following Commands: Follows one step commands with increased time, Follows one step commands consistently, Follows multi-step commands with increased time Safety/Judgement: Decreased awareness of safety, Decreased awareness of deficits Awareness: Emergent Problem Solving: Difficulty sequencing, Requires verbal cues, Requires tactile cues General Comments: Administered Mini-Cog Screening to assess memory, languange, visual-motor, and exective functioning. Pt scored a 1 out of 5 indicating meaningful cognitive impairment . Pt unable to recall ST memory words (version 1). Pt placing numbers in correct positions on clock, but inappropiately placed hands (both clock hands pointing to number 11).  Pt asking reassuring questions during test such as "do you want me to start at the 12?" Pt performing a trail making task to locate the exit. Provided him three step directions. Pt initially confused and required Min cues to realize confusion; pt then returning to his room to restart. Pt abel to recall his room number throughout task. During this session, pt demosntrating increased problem solving, awareness of deficits, and memory. However, continues to present with deficits impacting his safety with ADLs and IADLs.      Physical Exam: Blood pressure 126/64, pulse 72, temperature 98.3 F (36.8 C), temperature source Oral, resp. rate 18, height 5' 10" (1.778 m), weight 81.6 kg, SpO2 97 %. Physical Exam  Nursing note and vitals reviewed. Constitutional: He is oriented to person, place, and time. He appears well-developed  and well-nourished.  HENT:  Honey comb dressings on scalp.   Respiratory: No respiratory distress.  Lungs clear, no wheezes or rales Heart regular rate and rhythm no rubs murmurs or extra sounds Abdomen positive bowel sounds soft nontender palpation Extremities minimal pedal edema Skin without breakdown on heels, no rash  Neurological: He is alert and oriented to person, place, and time.  Speech clear. Able to answer orientation questions. He thought that today was 24th but able to recall month and year with minimal cues. He is able to follow simple motor commands.   Motor strength is 5/5 bilateral deltoid bicep tricep grip hip flexor knee extensor ankle dorsiflexor Sensation intact light touch bilateral upper and lower limbs Psychiatric: He has a normal mood and affect. His behavior is normal. Thought content normal.   Patient remembers 3/3 unrelated objects after 5-minute delay  Lab Results Last 48 Hours        Results for orders placed or performed during the hospital encounter of 06/23/18 (from the past 48 hour(s))  Basic metabolic panel     Status: Abnormal    Collection Time: 06/29/18  4:13 AM  Result Value Ref Range    Sodium 135 135 - 145 mmol/L    Potassium 3.9 3.5 - 5.1 mmol/L    Chloride 105 98 - 111 mmol/L    CO2 21 (L)   22 - 32 mmol/L    Glucose, Bld 113 (H) 70 - 99 mg/dL    BUN 23 8 - 23 mg/dL    Creatinine, Ser 1.03 0.61 - 1.24 mg/dL    Calcium 8.5 (L) 8.9 - 10.3 mg/dL    GFR calc non Af Amer >60 >60 mL/min    GFR calc Af Amer >60 >60 mL/min    Anion gap 9 5 - 15      Comment: Performed at [facility], [address] [place], [place] [address]  CBC     Status: Abnormal    Collection Time: 06/29/18  4:13 AM  Result Value Ref Range    WBC 7.3 4.0 - 10.5 K/uL    RBC 4.05 (L) 4.22 - 5.81 MIL/uL    Hemoglobin 11.9 (L) 13.0 - 17.0 g/dL    HCT 35.3 (L) 39.0 - 52.0 %    MCV 87.2 80.0 - 100.0 fL    MCH 29.4 26.0 - 34.0 pg    MCHC 33.7 30.0 - 36.0 g/dL    RDW  12.1 11.5 - 15.5 %    Platelets 137 (L) 150 - 400 K/uL    nRBC 0.0 0.0 - 0.2 %      Comment: Performed at [facility], [address] [place], [place] [address]  Magnesium     Status: None    Collection Time: 06/29/18  4:13 AM  Result Value Ref Range    Magnesium 2.0 1.7 - 2.4 mg/dL      Comment: Performed at [facility], [address] [place], [place] [address]  CBC     Status: Abnormal    Collection Time: 06/30/18  4:29 AM  Result Value Ref Range    WBC 7.2 4.0 - 10.5 K/uL    RBC 3.90 (L) 4.22 - 5.81 MIL/uL    Hemoglobin 11.4 (L) 13.0 - 17.0 g/dL    HCT 33.8 (L) 39.0 - 52.0 %    MCV 86.7 80.0 - 100.0 fL    MCH 29.2 26.0 - 34.0 pg    MCHC 33.7 30.0 - 36.0 g/dL    RDW 12.0 11.5 - 15.5 %    Platelets 135 (L) 150 - 400 K/uL    nRBC 0.0 0.0 - 0.2 %      Comment: Performed at [facility], [address] [place], [place] [address]      Imaging Results (Last 48 hours)  No results found.           Medical Problem List and Plan: 1.    Traumatic brain injury secondary to fall with subdural hematoma 2.  Antithrombotics: -DVT/anticoagulation:  Mechanical: Sequential compression devices, below knee Bilateral lower extremities             -antiplatelet therapy: ASA, Brilinta on hold, will need neurosurgery input in terms of changing aspirin to Plavix 3. Pain Management: N/A 4. Mood: LCSW to follow for evaluation and support.              -antipsychotic agents: N/A 5. Neuropsych: This patient is not fully capable of making decisions on his own behalf. 6. Skin/Wound Care: Monitor wound for healing.  7. Fluids/Electrolytes/Nutrition: Monitor I/O. Check lytes in am.  8. ICM/CAD s/p STEMI/DES:  On low dose ASA. Will continue to hold lipitor (was not taking at home?) 9.   Intermittent hypoxia: Educated/encourage IS. Check CXR  as well as daily weights. Monitor for signs of overload.    10. CKD?: Will monitor renal status with serial checks for changes. Avoid  nephrotoxic drugs. 11.   ABLA:  Recheck CBC in am. Monitor for stability.  12. Prediabetes:  FBS 120-130 range. Will monitor BS for X couple of days for trends.  13. Seizure prophylaxis: Continue Keppra bid.   No seizure activity   Post Admission Physician Evaluation: 1. Functional deficits secondary  to a medic brain injury. 2. Patient admitted to receive collaborative, interdisciplinary care between the physiatrist, rehab nursing staff, and therapy team. 3. Patient's level of medical complexity and substantial therapy needs in context of that medical necessity cannot be provided at a lesser intensity of care. 4. Patient has experienced substantial functional loss from his/her baseline.  Judging by the patient's diagnosis, physical exam, and functional history, the patient has potential for functional progress which will result in measurable gains while on inpatient rehab.  These gains will be of substantial and practical use upon discharge in facilitating mobility and self-care at the household level. 5. Physiatrist will provide 24 hour management of medical needs as well as oversight of the therapy plan/treatment and provide guidance as appropriate regarding the interaction of the two. 6. 24 hour rehab nursing will assist in the management of  bladder management, bowel management, safety, skin/wound care, disease management, medication administration, pain management and patient education  and help integrate therapy concepts, techniques,education, etc. 7. PT will assess and treat for: DME instruction, Gait training, Stair training, Functional mobility training, Therapeutic activities, Therapeutic exercise, Balance training, Cognitive remediation, Patient/family education.  Goals are: supervision. 8. OT will assess and treat for Self-care/ADL training, DME and/or AE instruction, Therapeutic activities, Balance training, Therapeutic exercise, Cognitive remediation/compensation, Neuromuscular education,  Patient/family education.  Goals are: supervision.  9. SLP will assess and treat for attention, concentration, problem-solving, thought organization, medication management  .  Goals are: supervision. 10. Case Management and Social Worker will assess and treat for psychological issues and discharge planning. 11. Team conference will be held weekly to assess progress toward goals and to determine barriers to discharge. 12.  Patient will receive at least 3 hours of therapy per day at least 5 days per week. 13. ELOS and Prognosis: 9-13d excellent  "I have personally performed a face to face diagnostic evaluation of this patient.  Additionally, I have reviewed and concur with the physician assistant's documentation above."  Andrew E. Kirsteins M.D. [facility] FAAPM&R (Sports Med, Neuromuscular Med) Diplomate Am Board of Electrodiagnostic Med      Pamela S Love, PA-C 06/30/2018          

## 2018-07-01 NOTE — Evaluation (Signed)
Speech Language Pathology Assessment and Plan  Patient Details  Name: AMAIR SHROUT MRN: [id number] Date of Birth: September 07, 1934  SLP Diagnosis: Cognitive Impairments  Rehab Potential: Good ELOS: 12-14 days     Today's Date: 07/01/2018 SLP Individual Time: 5053-9767 SLP Individual Time Calculation (min): 55 min   Problem List:  Patient Active Problem List   Diagnosis Date Noted  . Subdural hemorrhage, postinjury ([place]) 06/30/2018  . Stage 3 chronic kidney disease ([place])   . Benign essential HTN   . Cognitive deficit as late effect of traumatic brain injury ([place])   . Traumatic brain injury with loss of consciousness ([place])   . Coronary artery disease involving native artery of transplanted heart without angina pectoris   . Subdural hematoma ([place]) 06/27/2018  . SDH (subdural hematoma) ([place]) 06/27/2018  . Bilateral subdural hematomas ([place]) 06/24/2018  . Tobacco use 06/01/2018  . Status post coronary artery stent placement   . Acute ST elevation myocardial infarction (STEMI) of inferolateral wall ([place]) 05/30/2018  . Essential hypertension 05/30/2018  . Hyperlipidemia LDL goal <70 05/30/2018  . Acute ST elevation myocardial infarction (STEMI) of lateral wall ([place]) 05/30/2018   Past Medical History:  Past Medical History:  Diagnosis Date  . CAD (coronary artery disease)    a.  inferior STEMI s/p DES to D1 with residual disease - planned PCI to RCA, EF 45-50%.  . CKD (chronic kidney disease), stage II   . Hyperlipidemia, mild   . Hypertension   . Ischemic cardiomyopathy   . Kidney stones   . Pre-diabetes   . RBBB   . Thrombocytopenia ([place])   . Tobacco abuse    Past Surgical History:  Past Surgical History:  Procedure Laterality Date  . BURR HOLE N/A 06/27/2018   Procedure: [place] Flirt;  Surgeon: Kary Kos, MD;  Location: [facility];  Service: Neurosurgery;  Laterality: N/A;  . CORONARY/GRAFT ACUTE MI REVASCULARIZATION N/A 05/30/2018   Procedure: CORONARY/GRAFT ACUTE MI  REVASCULARIZATION;  Surgeon: Belva Crome, MD;  Location: [facility];  Service: Cardiovascular;  Laterality: N/A;  . LEFT HEART CATH AND CORONARY ANGIOGRAPHY N/A 05/30/2018   Procedure: LEFT HEART CATH AND CORONARY ANGIOGRAPHY;  Surgeon: Belva Crome, MD;  Location: [facility];  Service: Cardiovascular;  Laterality: N/A;    Assessment / Plan / Recommendation Clinical Impression Patient is an 84 year old male with history of CKD, HTN, ICM/CAD s/p STEMI with PCI/DES with residual disease RCA who was discharged to home on 06/01/2018.  Patient returned to his home but had several falls and starting around 06/19/2018 had some altered mental status.  This worsened and he presented to the ER on 06/23/2018 CT head revealed large largebilateral SDH of differing ages with mass-effect on superior frontal and parietal lobes.Serial CT of head recommended by neurosurgery and DAPT held. Cardiology consulted and cleared patient for surgery. He underwent bilateral bur hole evacuation of subacute SDH on 06/27/2018 by Dr. Saintclair Halsted. Most recent CT head showed decrease in size of bilateral SDH with moderate bilateral pneumocephalus. Postop patient continue have issues with confusion, poor safety awareness and unsteady gait. CIR recommended due to functional decline and patient admitted 06/30/18.  Patient demonstrates behaviors consistent with a [place] Level VII characterized by impaired sustained attention, functional problem solving, emergent awareness and recall of daily information when impacts his safety with functional and familiar tasks. Patient was administered the Cognistat and demonstrated mild impairments in orientation and calculations and severe impairments in short-term recall. Patient's wife  called during session and educated in regards to current impairments and goals of skilled SLP intervention, she verbalized understanding. Patient would benefit from skilled SLP intervention to maximize his  cognitive functioning and overall functional independence prior to discharge.    Skilled Therapeutic Interventions          Patient administered a cognitive-linguistic evaluation, please see above for details. Educated patient in regards to current cognitive impairments and goals of skilled SLP intervention, he verbalized understanding and agreement.   SLP Assessment  Patient will need skilled [place] [facility] during CIR admission    Recommendations  Oral Care Recommendations: Oral care BID Recommendations for Other Services: Neuropsych consult Patient destination: Home Follow up Recommendations: 24 hour supervision/assistance;Home Health SLP Equipment Recommended: None recommended by SLP    SLP Frequency 3 to 5 out of 7 days   SLP Duration  SLP Intensity  SLP Treatment/Interventions 12-14 days   Minumum of 1-2 x/day, 30 to 90 minutes  Cognitive remediation/compensation;Cueing hierarchy;Functional tasks;Patient/family education;Therapeutic Activities;Environmental controls;Internal/external aids    Pain No/Denies Pain  Short Term Goals: Week 1: SLP Short Term Goal 1 (Week 1): Patient will demonstrate sustained attention to functional tasks for 45 minutes with Min A verbal cues for redirection.  SLP Short Term Goal 2 (Week 1): Patient will demonstrate functional problem solving for basic and familiar tasks with Min A verbal cues.  SLP Short Term Goal 3 (Week 1): Patient will self-monitor and correct errors during functional tasks with Min A verbal cues.  SLP Short Term Goal 4 (Week 1): Patient will recall new, daily information with Mod A multimodal cues.   Refer to Care Plan for Long Term Goals  Recommendations for other services: Neuropsych  Discharge Criteria: Patient will be discharged from SLP if patient refuses treatment 3 consecutive times without medical reason, if treatment goals not met, if there is a change in medical status, if patient makes no  progress towards goals or if patient is discharged from hospital.  The above assessment, treatment plan, treatment alternatives and goals were discussed and mutually agreed upon: by patient and by family  Charmane Protzman 07/01/2018, 1:37 PM

## 2018-07-01 NOTE — Evaluation (Signed)
Physical Therapy Assessment and Plan  Patient Details  Name: Craig [name] MRN: [id number] Date of Birth: [birth year]  PT Diagnosis: Abnormality of gait, Coordination disorder, Difficulty walking, Hemiparesis, Impaired cognition and Muscle weakness Rehab Potential: Good ELOS: 10-12 days   Today's Date: 07/01/2018 PT Individual Time: 7564-3329 PT Individual Time Calculation (min): 71 min    Problem List:  Patient Active Problem List   Diagnosis Date Noted  . Subdural hemorrhage, postinjury ([place]) 06/30/2018  . Stage 3 chronic kidney disease ([place])   . Benign essential HTN   . Cognitive deficit as late effect of traumatic brain injury ([place])   . Traumatic brain injury with loss of consciousness ([place])   . Coronary artery disease involving native artery of transplanted heart without angina pectoris   . Subdural hematoma ([place]) 06/27/2018  . SDH (subdural hematoma) ([place]) 06/27/2018  . Bilateral subdural hematomas ([place]) 06/24/2018  . Tobacco use 06/01/2018  . Status post coronary artery stent placement   . Acute ST elevation myocardial infarction (STEMI) of inferolateral wall ([place]) 05/30/2018  . Essential hypertension 05/30/2018  . Hyperlipidemia LDL goal <70 05/30/2018  . Acute ST elevation myocardial infarction (STEMI) of lateral wall ([place]) 05/30/2018    Past Medical History:  Past Medical History:  Diagnosis Date  . CAD (coronary artery disease)    a.  inferior STEMI s/p DES to D1 with residual disease - planned PCI to RCA, EF 45-50%.  . CKD (chronic kidney disease), stage II   . Hyperlipidemia, mild   . Hypertension   . Ischemic cardiomyopathy   . Kidney stones   . Pre-diabetes   . RBBB   . Thrombocytopenia ([place])   . Tobacco abuse    Past Surgical History:  Past Surgical History:  Procedure Laterality Date  . BURR HOLE N/A 06/27/2018   Procedure: Haskell Flirt;  Surgeon: Kary Kos, MD;  Location: [facility];  Service: Neurosurgery;  Laterality: N/A;  . CORONARY/GRAFT ACUTE  MI REVASCULARIZATION N/A 05/30/2018   Procedure: CORONARY/GRAFT ACUTE MI REVASCULARIZATION;  Surgeon: Belva Crome, MD;  Location: [facility];  Service: Cardiovascular;  Laterality: N/A;  . LEFT HEART CATH AND CORONARY ANGIOGRAPHY N/A 05/30/2018   Procedure: LEFT HEART CATH AND CORONARY ANGIOGRAPHY;  Surgeon: Belva Crome, MD;  Location: [facility];  Service: Cardiovascular;  Laterality: N/A;    Assessment & Plan Clinical Impression: Patient is a 84 y.o. year old male with history of CKD, HTN, ICM/CAD s/p STEMI with PCI/DES with residual disease RCA who was discharged to home on 06/01/2018.  Patient returned to his home but had several falls and starting around 06/19/2018 had some altered mental status.  This worsened and he presented to the ER on 06/23/2018 CT head revealed large largebilateral SDH of differing ages with mass-effect on superior frontal and parietal lobes.Serial CT of head recommended by neurosurgery and DAPT held. Cardiology consulted and cleared patient for surgery. He underwent bilateral bur hole evacuation of subacute SDH on 06/27/2018 by Dr. Saintclair Halsted. Most recent CT head showed decrease in size of bilateral SDH with moderate bilateral pneumocephalus. Postop patient continue have issues with confusion, poor safety awareness and unsteady gait. CIR recommended due to functional decline  Patient feels like his memory has not been so good since he was cutting down a tree about 5 years ago and a branch hit him in the head he did not seek medical attention.  Patient transferred to CIR on 06/30/2018 .   Patient currently requires min with  mobility secondary to muscle weakness, decreased cardiorespiratoy endurance, decreased coordination, decreased attention to right, decreased attention, decreased awareness, decreased problem solving, decreased safety awareness, decreased memory and delayed processing, and decreased standing balance, decreased postural control and  decreased balance strategies.  Prior to hospitalization, patient was independent  with mobility and lived with [employer]) in a House home.  Home access is 2Stairs to enter.  Patient will benefit from skilled PT intervention to maximize safe functional mobility, minimize fall risk and decrease caregiver burden for planned discharge home with 24 hour supervision.  Anticipate patient will benefit from follow up [place] at discharge.  PT - End of Session Activity Tolerance: Tolerates 30+ min activity with multiple rests Endurance Deficit: Yes Endurance Deficit Description: 2/2 generalized deconditioning & fatigue PT Assessment Rehab Potential (ACUTE/IP ONLY): Good PT Barriers to Discharge: Decreased caregiver support PT Barriers to Discharge Comments: unsure if wife can provide supervision PT Patient demonstrates impairments in the following area(s): Balance;Skin Integrity;Pain;Behavior;Endurance;Perception;Safety;Motor;Sensory PT Transfers Functional Problem(s): Bed Mobility;Bed to Chair;Furniture;Car;Floor PT Locomotion Functional Problem(s): Ambulation;Wheelchair Mobility;Stairs PT Plan PT Intensity: Minimum of 1-2 x/day ,45 to 90 minutes PT Frequency: 5 out of 7 days PT Duration Estimated Length of Stay: 10-12 days PT Treatment/Interventions: Ambulation/gait training;Cognitive remediation/compensation;Discharge planning;DME/adaptive equipment instruction;Functional mobility training;Pain management;Psychosocial support;Splinting/orthotics;Therapeutic Activities;UE/LE Strength taining/ROM;Visual/perceptual remediation/compensation;Wheelchair propulsion/positioning;UE/LE Coordination activities;Therapeutic Exercise;Stair training;Skin care/wound management;Patient/family education;Neuromuscular re-education;Functional electrical stimulation;Disease management/prevention;Community reintegration;Balance/vestibular training PT Transfers Anticipated Outcome(s): supervision with LRAD PT Locomotion  Anticipated Outcome(s): supervision with LRAD PT Recommendation Recommendations for Other Services: Speech consult;Neuropsych consult;Therapeutic Recreation consult Therapeutic Recreation Interventions: Pet therapy;Outing/community reintergration;Stress management Follow Up Recommendations: Home health PT;24 hour supervision/assistance Patient destination: Home Equipment Recommended: To be determined  Skilled Therapeutic Intervention Patient received asleep in bed but awakened & agreeable to tx. No c/o pain reported. Pt completes bed mobility with min assist for supine>sit and supervision sit>supine & rolling L<>R. Pt consumes a small amount of lunch with set up assist. Pt completes sit<>stand, stand pivot, and car transfers with min assist, ambulates 200 ft without AD & min assist with gait pattern as described below, negotiates 24 steps (6" + 3") with B rails and min assist, negotiates ramp & uneven surface with min assist without AD, retrieves object from floor with min assist, propels w/c with BUE & supervision x 20 ft. Pt utilizes kinetron in sitting then standing with BUE support with max cuing for full hip extension and upright posture with task focusing on weight shifting, dynamic balance, and R NMR & strengthening. At end of session pt left in w/c with alarm set & all needs in reach, reviewed use of call bell with pt able to teach back.    PT Evaluation Precautions/Restrictions Precautions Precautions: Fall Precaution Comments: recent burr hole surgery Restrictions Weight Bearing Restrictions: No  General Chart Reviewed: Yes Additional Pertinent History: CKD, HLD, Right BBB, HTN, ICM/CAD s/p STEMI Response to Previous Treatment: Patient reporting fatigue but able to participate. Family/Caregiver Present: No   Home Living/Prior Functioning Home Living Available Help at Discharge: Family;Available 24 hours/day Type of Home: House Home Access: Stairs to enter [employer] of Steps: 2 Entrance Stairs-Rails: None Home Layout: One level  Lives With: [employer]) Prior Function Level of Independence: Independent with basic ADLs;Independent with homemaking with ambulation;Independent with gait;Independent with transfers  Able to Take Stairs?: Yes Driving: Yes Vocation: Retired Comments: per chart pt driving PTA, pt reports he gardens, mows, cuts wood  Vision/Perception  Pt reports he wears glasses for reading only at baseline.  No changes in baseline vision. Decreased awareness to R side of body during gait.   Cognition Overall Cognitive Status: Impaired/Different from baseline Arousal/Alertness: Lethargic Orientation Level: Oriented to person;Oriented to time(oriented to month only) Attention: Sustained Memory: Impaired Memory Impairment: Decreased short term memory;Decreased recall of new information Decreased Short Term Memory: Functional basic Awareness: Impaired Awareness Impairment: Intellectual impairment;Emergent impairment;Anticipatory impairment Problem Solving: Impaired Problem Solving Impairment: Functional basic Behaviors: Impulsive Safety/Judgment: Impaired Rancho Los Amigos Scales of Cognitive Functioning: Automatic/appropriate  Sensation Sensation Light Touch: Appears Intact(denies numbness/tingling) Coordination Gross Motor Movements are Fluid and Coordinated: No Fine Motor Movements are Fluid and Coordinated: No  Motor  Motor Motor: Abnormal postural alignment and control Motor - Skilled Clinical Observations: R hemiparesis, generalized deconditioning   Mobility Bed Mobility Bed Mobility: Rolling Right;Rolling Left;Supine to Sit;Sit to Supine Rolling Right: Supervision/verbal cueing Rolling Left: Supervision/Verbal cueing Supine to Sit: Minimal Assistance - Patient > 75% Sit to Supine: Supervision/Verbal cueing Transfers Transfers: Sit to Stand;Stand to Sit;Stand Pivot Transfers Sit to Stand:  Minimal Assistance - Patient > 75% Stand to Sit: Minimal Assistance - Patient > 75% Stand Pivot Transfers: Minimal Assistance - Patient > 75%  Locomotion  Gait Ambulation: Yes Gait Assistance: Minimal Assistance - Patient > 75% Gait Distance (Feet): 200 Feet Assistive device: None Gait Assistance Details: Verbal cues for technique Gait Assistance Details: verbal cuing for increased foot clearance RLE Gait Gait: Yes Gait Pattern: (decreased weight shifting L, decreased heel strike BLE, decreased stride length, decreased step length BLE, decreased foot clearance RLE, shuffled gait, B knee flexion) Gait velocity: decreased Stairs / Additional Locomotion Stairs: Yes Stairs Assistance: Minimal Assistance - Patient > 75% Stair Management Technique: Two rails Number of Stairs: 24 Height of Stairs: (6" + 3") Ramp: Minimal Assistance - Patient >75%(ambulatory with R HHA) Wheelchair Mobility Wheelchair Mobility: Yes Wheelchair Assistance: Chartered loss adjuster: Both upper extremities Wheelchair Parts Management: Needs assistance Distance: 20 ft    Trunk/Postural Assessment  Cervical Assessment Cervical Assessment: Exceptions to WFL(head forward) Thoracic Assessment Thoracic Assessment: Exceptions to WFL(rounded  shoulders) Lumbar Assessment Lumbar Assessment: Exceptions to WFL(posterior pelvic tilt) Postural Control Postural Control: Deficits on evaluation Righting Reactions: delayed Protective Responses: delayed   Balance Balance Balance Assessed: Yes Dynamic Standing Balance Dynamic Standing - Balance Support: During functional activity;No upper extremity supported Dynamic Standing - Level of Assistance: 4: Min assist  Extremity Assessment  RUE Assessment RUE Assessment: Exceptions to [facility] General Strength Comments: generalized wekaness, slow movement  LUE Assessment LUE Assessment: Exceptions to [facility] General Strength Comments: generalized  weakness >RUE  RLE Assessment RLE Assessment: Exceptions to [facility] General Strength Comments: not formally tested, grossly 3+/5 as pt able to weight bear without buckling  LLE Assessment LLE Assessment: Within Functional Limits    Refer to Care Plan for Long Term Goals  Recommendations for other services: Neuropsych and Therapeutic Recreation  Pet therapy, Stress management and Outing/community reintegration  Discharge Criteria: Patient will be discharged from PT if patient refuses treatment 3 consecutive times without medical reason, if treatment goals not met, if there is a change in medical status, if patient makes no progress towards goals or if patient is discharged from hospital.  The above assessment, treatment plan, treatment alternatives and goals were discussed and mutually agreed upon: by patient  Waunita Schooner 07/01/2018, 3:39 PM

## 2018-07-01 NOTE — Progress Notes (Signed)
Social Work  Social Work Assessment and Plan  Patient Details  Name: Craig [name] MRN: [id number] Date of Birth: 11-Jan-1935  Today's Date: 07/01/2018  Problem List:  Patient Active Problem List   Diagnosis Date Noted  . Subdural hemorrhage, postinjury ([facility]) 06/30/2018  . Stage 3 chronic kidney disease ([facility])   . Benign essential HTN   . Cognitive deficit as late effect of traumatic brain injury ([facility])   . Traumatic brain injury with loss of consciousness ([facility])   . Coronary artery disease involving native artery of transplanted heart without angina pectoris   . Subdural hematoma ([facility]) 06/27/2018  . SDH (subdural hematoma) ([facility]) 06/27/2018  . Bilateral subdural hematomas ([facility]) 06/24/2018  . Tobacco use 06/01/2018  . Status post coronary artery stent placement   . Acute ST elevation myocardial infarction (STEMI) of inferolateral wall ([facility]) 05/30/2018  . Essential hypertension 05/30/2018  . Hyperlipidemia LDL goal <70 05/30/2018  . Acute ST elevation myocardial infarction (STEMI) of lateral wall ([facility]) 05/30/2018   Past Medical History:  Past Medical History:  Diagnosis Date  . CAD (coronary artery disease)    a.  inferior STEMI s/p DES to D1 with residual disease - planned PCI to RCA, EF 45-50%.  . CKD (chronic kidney disease), stage II   . Hyperlipidemia, mild   . Hypertension   . Ischemic cardiomyopathy   . Kidney stones   . Pre-diabetes   . RBBB   . Thrombocytopenia ([facility])   . Tobacco abuse    Past Surgical History:  Past Surgical History:  Procedure Laterality Date  . BURR HOLE N/A 06/27/2018   Procedure: [name];  Surgeon: Donalee Citrin, MD;  Location: [facility] OR;  Service: Neurosurgery;  Laterality: N/A;  . CORONARY/GRAFT ACUTE MI REVASCULARIZATION N/A 05/30/2018   Procedure: CORONARY/GRAFT ACUTE MI REVASCULARIZATION;  Surgeon: Lyn Records, MD;  Location: [facility] INVASIVE [facility];  Service: Cardiovascular;  Laterality: N/A;  . LEFT HEART CATH AND CORONARY ANGIOGRAPHY N/A  05/30/2018   Procedure: LEFT HEART CATH AND CORONARY ANGIOGRAPHY;  Surgeon: Lyn Records, MD;  Location: [facility] INVASIVE CV LAB;  Service: Cardiovascular;  Laterality: N/A;   Social History:  reports that he quit smoking about 60 years ago. His smokeless tobacco use includes chew. He reports that he does not drink alcohol or use drugs.  Family / Support Systems Marital Status: Married Patient Roles: Spouse, Parent Spouse/Significant Other: wife, Alfonza Briese @ (H) [phone] or (C) [phone] Children: daughter, Debbora Presto @ [phone] and daughter, Lyndsey Mayr @ [phone] - both local Anticipated Caregiver: wife, Randa Evens, and daughters, Victorino Dike and Patty Ability/Limitations of Caregiver: none anticipated Caregiver Availability: 24/7 Family Dynamics: Pt's wife and daughters are very involved and have many concerns about pt's rehab and getting him home.  They are very committed to providing any level of assistance needed.  Social History Preferred language: English Religion: Christian Cultural Background: NA Read: Yes Write: Yes Employment Status: Retired Marine scientist Issues: none Guardian/Conservator: none - per MD, pt is not fully capable of making decisions on his own behalf - defer to wife   Abuse/Neglect Abuse/Neglect Assessment Can Be Completed: Yes Physical Abuse: Denies Verbal Abuse: Denies Sexual Abuse: Denies Exploitation of patient/patient's resources: Denies Self-Neglect: Denies  Emotional Status Pt's affect, behavior and adjustment status: Pt lying in bed and initially declines to speak with me.  Went back a little later and he is more agreeable and able to answer basic questions.  He admits frustration with his situation  and loss of independence.  Will likely benefit from neuropsychology involvement. Recent Psychosocial Issues: None Psychiatric History: None Substance Abuse History: None  Patient / Family Perceptions,  Expectations & Goals Pt/Family understanding of illness & functional limitations: Pt and family with general understanding of the cardiac and, now, neurological issues/ deficits/ need for CIR.  All are very eager for a short LOS.   Premorbid pt/family roles/activities: Pt was very active and "completely independent" until his STEMI.  Both pt and daughter notes that, "since the heart attack everything has just fallen apart." Anticipated changes in roles/activities/participation: Anticipate wife and daughters will need to provide 24/7 supervision at a minimum.   Pt/family expectations/goals: "I just want to get home."  [employer]: None Premorbid Home Care/DME Agencies: None Transportation available at discharge: yes Resource referrals recommended: Neuropsychology, Support group (specify)  Discharge Planning Living Arrangements: Spouse/significant other Support Systems: Spouse/significant other, Children Type of Residence: Private residence Insurance Resources: Medicare(UHC Medicare) Financial Resources: Restaurant manager, fast food Screen Referred: No Living Expenses: Own Money Management: Patient Does the patient have any problems obtaining your medications?: No Home Management: pt and family Patient/Family Preliminary Plans: Pt to return home with spouse and daughters providing any needed assistance. Social Work Anticipated Follow Up Needs: HH/OP, Support Group Expected length of stay: 9-14 days  Clinical Impression Unfortunate gentleman here for burr hole for SDH and known recent MI with stent placement.  Family very supportive and concerned.  Pt frustrated with situation and eager to get home ASAP.  Family able to provide 24/7 assistance.  Monitor that pt is now allowing frustration to interfere with therapy.  Will follow for support and d/c planning needs.  Lealon Vanputten 07/01/2018, 3:48 PM

## 2018-07-02 ENCOUNTER — Inpatient Hospital Stay (HOSPITAL_COMMUNITY): Payer: Medicare Other | Admitting: Speech Pathology

## 2018-07-02 ENCOUNTER — Inpatient Hospital Stay (HOSPITAL_COMMUNITY): Payer: Medicare Other

## 2018-07-02 ENCOUNTER — Inpatient Hospital Stay (HOSPITAL_COMMUNITY): Payer: Medicare Other | Admitting: Physical Therapy

## 2018-07-02 DIAGNOSIS — S065X9A Traumatic subdural hemorrhage with loss of consciousness of unspecified duration, initial encounter: Secondary | ICD-10-CM

## 2018-07-02 DIAGNOSIS — F068 Other specified mental disorders due to known physiological condition: Secondary | ICD-10-CM

## 2018-07-02 DIAGNOSIS — S069X0S Unspecified intracranial injury without loss of consciousness, sequela: Secondary | ICD-10-CM

## 2018-07-02 LAB — GLUCOSE, CAPILLARY
Glucose-Capillary: 122 mg/dL — ABNORMAL HIGH (ref 70–99)
Glucose-Capillary: 135 mg/dL — ABNORMAL HIGH (ref 70–99)
Glucose-Capillary: 104 mg/dL — ABNORMAL HIGH (ref 70–99)
Glucose-Capillary: 117 mg/dL — ABNORMAL HIGH (ref 70–99)

## 2018-07-02 NOTE — Plan of Care (Signed)
  Problem: Consults Goal: RH BRAIN INJURY PATIENT EDUCATION Description Description: See Patient Education module for eduction specifics Outcome: Progressing   Problem: RH SKIN INTEGRITY Goal: RH STG SKIN FREE OF INFECTION/BREAKDOWN Description Patients skin will remain free from further infection or breakdown with min assist.  Outcome: Progressing Goal: RH STG MAINTAIN SKIN INTEGRITY WITH ASSISTANCE Description STG Maintain Skin Integrity With min Assistance.  Outcome: Progressing Goal: RH STG ABLE TO PERFORM INCISION/WOUND CARE W/ASSISTANCE Description STG Able To Perform Incision/Wound Care With min Assistance.  Outcome: Progressing   Problem: RH SAFETY Goal: RH STG ADHERE TO SAFETY PRECAUTIONS W/ASSISTANCE/DEVICE Description STG Adhere to Safety Precautions With min Assistance/Device.  Outcome: Progressing   Problem: RH COGNITION-NURSING Goal: RH STG USES MEMORY AIDS/STRATEGIES W/ASSIST TO PROBLEM SOLVE Description STG Uses Memory Aids/Strategies With min Assistance to Problem Solve.  Outcome: Progressing   Problem: RH KNOWLEDGE DEFICIT BRAIN INJURY Goal: RH STG INCREASE KNOWLEDGE OF SELF CARE AFTER BRAIN INJURY Outcome: Progressing   

## 2018-07-02 NOTE — Progress Notes (Signed)
Physical Therapy Session Note  Patient Details  Name: Craig [name] MRN: [id number] Date of Birth: 01/30/1935  Today's Date: 07/02/2018 PT Individual Time: 1735-6701 PT Individual Time Calculation (min): 55 min   Short Term Goals: Week 1:  PT Short Term Goal 1 (Week 1): Pt will negotiate 2 steps without rails with min assist.  PT Short Term Goal 2 (Week 1): Pt will ambulate 75 ft with LRAD & supervision.  PT Short Term Goal 3 (Week 1): Pt will complete floor transfer with min assist.   Skilled Therapeutic Interventions/Progress Updates:   Pt in supine and agreeable to therapy, no c/o pain. Requesting to toilet, ambulated to/from toilet w/ min assist w/o AD. Pericare, LE garment management, and hand hygiene w/ CGA. Ambulated around unit w/ CGA-min assist in controlled environment, verbal cues for environmental awareness and tactile cues for balance. Worked on recall and memory while way-finding and orienting to unit. NuStep 8 min @ level 4 w/ all extremities to work on Radiographer, therapeutic. Performed Berg Balance Scale, scored 38/56 as detailed in flowsheets. Explained significance of results to pt including increased fall risk. Pt perseverating on asking when he can go home, requires max cues to orient to why he is in the hospital and importance of continuing w/ therapy to address high fall risk. Returned to room and ended session in w/c, all needs in reach.  Therapy Documentation Precautions:  Precautions Precautions: Fall Precaution Comments: recent burr hole surgery Restrictions Weight Bearing Restrictions: No Vital Signs: Therapy Vitals Temp: 97.7 F (36.5 C) Pulse Rate: 85 Resp: 20 BP: 122/70 Patient Position (if appropriate): Lying Oxygen Therapy SpO2: 100 % O2 Device: Room Air  Therapy/Group: Individual Therapy  Anneli Bing Melton Krebs 07/02/2018, 4:58 PM

## 2018-07-02 NOTE — Progress Notes (Signed)
Occupational Therapy Session Note  Patient Details  Name: Craig [name] MRN: [id number] Date of Birth: [birth year]  Today's Date: 07/02/2018 OT Individual Time: 1045-1200 OT Individual Time Calculation (min): 75 min    Short Term Goals: Week 1:  OT Short Term Goal 1 (Week 1): Pt will stand at sink wiht CGA for oral care to demo improved standing tolerance/endruance OT Short Term Goal 2 (Week 1): Pt will thread BUE into shirt to demo improved sequencing OT Short Term Goal 3 (Week 1): Pt will complete toilet transfer wiht CGA OT Short Term Goal 4 (Week 1): Pt will thread BLE into pants wiht VC only  Skilled Therapeutic Interventions/Progress Updates:    1:1. Pt received in w/c. Pleasant and much clearer than yesterday. Pt requesting to toilet and wash up at the sink. CGA provided for transfers/ambualtion with RW this date with MAX VC for safe reach back, larger steps/picking up R foot, and management of RW. Pt complete 3/3 components of toileting with CGA. Pt bathes at sink, sit to stand with CGA and min VC for sequencing. Pt able to don LB clothing at sink with CGA and shirt in standing (despite VC for seated dressing d/t balance deficits) with CGA. Pt shaves/oral care in standing with hips stabilizing on sink with supervision. Pt completes walk in shower transfer with CGA after demonstration of posterior method. Pt continues to demo decreased awareness and mental flexibility throughout tasks decreasing safety during performance of BADLs. Exited session with pt seated in w/c, belt alarm on, clal lith in reach and all need smet   Therapy Documentation Precautions:  Precautions Precautions: Fall Precaution Comments: recent burr hole surgery Restrictions Weight Bearing Restrictions: No General:   Vital Signs:  Pain: Pain Assessment Pain Scale: 0-10 Pain Score: 0-No pain   Therapy/Group: Individual Therapy  Shon Hale 07/02/2018, 12:06 PM

## 2018-07-02 NOTE — Progress Notes (Signed)
Speech Language Pathology Daily Session Note  Patient Details  Name: Craig [name] MRN: [id number] Date of Birth: 09-02-1934  Today's Date: 07/02/2018 SLP Individual Time: 0915-1000 SLP Individual Time Calculation (min): 45 min  Short Term Goals: Week 1: SLP Short Term Goal 1 (Week 1): Patient will demonstrate sustained attention to functional tasks for 45 minutes with Min A verbal cues for redirection.  SLP Short Term Goal 2 (Week 1): Patient will demonstrate functional problem solving for basic and familiar tasks with Min A verbal cues.  SLP Short Term Goal 3 (Week 1): Patient will self-monitor and correct errors during functional tasks with Min A verbal cues.  SLP Short Term Goal 4 (Week 1): Patient will recall new, daily information with Mod A multimodal cues.   Skilled Therapeutic Interventions: Skilled treatment session focused on cognitive goals. SLP facilitated session by providing supervision verbal cues for complex problem solving and organization during a money management task. Patient's function with task impacted by decreased mental flexibility. Patient left upright in wheelchair with alarm on and all needs within reach. Continue with current plan of care.      Pain Pain Assessment Pain Scale: 0-10 Pain Score: 0-No pain  Therapy/Group: Individual Therapy  Shantese Raven 07/02/2018, 10:36 AM

## 2018-07-02 NOTE — Progress Notes (Signed)
[place] PHYSICAL MEDICINE & REHABILITATION PROGRESS NOTE   Subjective/Complaints: No issues overnite Discussed d/c date will be set at [facility] 3/31, pt wishes to leave 4/2  ROS: Limited due to cognitive/behavioral    Objective:   Dg Chest 2 View  Result Date: 06/30/2018 CLINICAL DATA:  Shortness of breath. History of ischemic cardiomyopathy. EXAM: CHEST - 2 VIEW COMPARISON:  None. FINDINGS: Heart size is normal. Mediastinal shadows are normal. The lungs are clear. The vascularity is normal. No effusions. Incidental nipple shadows. IMPRESSION: Normal chest Electronically Signed   By: Paulina Fusi M.D.   On: 06/30/2018 16:37   Vas [place] Lower Extremity Venous (dvt)  Result Date: 07/01/2018  Lower Venous Study Indications: Edema.  Performing Technologist: Toma Deiters RVS  Examination Guidelines: A complete evaluation includes B-mode imaging, spectral Doppler, color Doppler, and power Doppler as needed of all accessible portions of each vessel. Bilateral testing is considered an integral part of a complete examination. Limited examinations for reoccurring indications may be performed as noted.  Right Venous Findings: +---------+---------------+---------+-----------+----------+-------+          CompressibilityPhasicitySpontaneityPropertiesSummary +---------+---------------+---------+-----------+----------+-------+ CFV      Full           Yes      Yes                          +---------+---------------+---------+-----------+----------+-------+ SFJ      Full                                                 +---------+---------------+---------+-----------+----------+-------+ FV Prox  Full           Yes      Yes                          +---------+---------------+---------+-----------+----------+-------+ FV Mid   Full                                                 +---------+---------------+---------+-----------+----------+-------+ FV DistalFull           Yes       Yes                          +---------+---------------+---------+-----------+----------+-------+ PFV      Full           Yes      Yes                          +---------+---------------+---------+-----------+----------+-------+ POP      Full           Yes      Yes                          +---------+---------------+---------+-----------+----------+-------+ PTV      Full                                                 +---------+---------------+---------+-----------+----------+-------+  PERO     Full                                                 +---------+---------------+---------+-----------+----------+-------+  Left Venous Findings: +---------+---------------+---------+-----------+----------+-------+          CompressibilityPhasicitySpontaneityPropertiesSummary +---------+---------------+---------+-----------+----------+-------+ CFV      Full           Yes      Yes                          +---------+---------------+---------+-----------+----------+-------+ SFJ      Full                                                 +---------+---------------+---------+-----------+----------+-------+ FV Prox  Full           Yes      Yes                          +---------+---------------+---------+-----------+----------+-------+ FV Mid   Full                                                 +---------+---------------+---------+-----------+----------+-------+ FV DistalFull           Yes      Yes                          +---------+---------------+---------+-----------+----------+-------+ PFV      Full           Yes      Yes                          +---------+---------------+---------+-----------+----------+-------+ POP      Full           Yes      Yes                          +---------+---------------+---------+-----------+----------+-------+ PTV      Full                                                  +---------+---------------+---------+-----------+----------+-------+ PERO     Full                                                 +---------+---------------+---------+-----------+----------+-------+    Summary: Right: There is no evidence of deep vein thrombosis in the lower extremity. No cystic structure found in the popliteal fossa. Left: There is no evidence of deep vein thrombosis in the lower extremity. No cystic structure found in the popliteal fossa.  *See table(s) above for measurements and observations. Electronically signed by Waverly Ferrari MD on 07/01/2018 at 5:21:34 PM.  Final    Recent Labs    06/30/18 0429 07/01/18 0535  WBC 7.2 9.4  HGB 11.4* 12.8*  HCT 33.8* 36.3*  PLT 135* 142*   Recent Labs    07/01/18 0535  NA 135  K 4.0  CL 98  CO2 25  GLUCOSE 123*  BUN 13  CREATININE 1.17  CALCIUM 9.2    Intake/Output Summary (Last 24 hours) at 07/02/2018 1108 Last data filed at 07/02/2018 0834 Gross per 24 hour  Intake 460 ml  Output -  Net 460 ml     Physical Exam: Vital Signs Blood pressure (!) 106/56, pulse 84, temperature 98.3 F (36.8 C), temperature source Oral, resp. rate 16, SpO2 (!) 89 %. Constitutional: No distress . Vital signs reviewed. HEENT: EOMI, oral membranes moist, scalp dressings in place, wounds dry. Neck: supple Cardiovascular: RRR without murmur. No JVD    Respiratory: CTA Bilaterally without wheezes or rales. Normal effort    GI: BS +, non-tender, non-distended  Extremities minimal pedal edema Skin without breakdown on heels, no rash  Neurological: able to tell me he was in the hospital when given cues. Knew the month. .  Motor strength is 4+ to 5/5 bilateral deltoid bicep tricep grip hip flexor knee extensor ankle dorsiflexor. Reasonable dexterity. Normal sensation.  Sensation intact light touch bilateral upper and lower limbs Psychiatric: flat, cooperative    Assessment/Plan: 1. Functional deficits secondary to TBI/SDH  which require 3+ hours per day of interdisciplinary therapy in a comprehensive inpatient rehab setting.  Physiatrist is providing close team supervision and 24 hour management of active medical problems listed below.  Physiatrist and rehab team continue to assess barriers to discharge/monitor patient progress toward functional and medical goals  Care Tool:  Bathing    Body parts bathed by patient: Right arm, Left arm, Chest, Abdomen, Front perineal area, Buttocks, Right upper leg, Left upper leg, Right lower leg, Left lower leg, Face         Bathing assist Assist Level: Moderate Assistance - Patient 50 - 74%     Upper Body Dressing/Undressing Upper body dressing   What is the patient wearing?: Pull over shirt    Upper body assist Assist Level: Maximal Assistance - Patient 25 - 49%    Lower Body Dressing/Undressing Lower body dressing      What is the patient wearing?: Underwear/pull up, Pants     Lower body assist Assist for lower body dressing: Total Assistance - Patient < 25%     Toileting Toileting    Toileting assist Assist for toileting: Moderate Assistance - Patient 50 - 74%     Transfers Chair/bed transfer  Transfers assist     Chair/bed transfer assist level: Minimal Assistance - Patient > 75%     Locomotion Ambulation   Ambulation assist      Assist level: Minimal Assistance - Patient > 75%   Max distance: 200 ft   Walk 10 feet activity   Assist     Assist level: Minimal Assistance - Patient > 75%     Walk 50 feet activity   Assist    Assist level: Minimal Assistance - Patient > 75%      Walk 150 feet activity   Assist    Assist level: Minimal Assistance - Patient > 75%      Walk 10 feet on uneven surface  activity   Assist     Assist level: Minimal Assistance - Patient > 75%     Wheelchair  Assist Will patient use wheelchair at discharge?: No Type of Wheelchair: Manual    Wheelchair assist level:  Supervision/Verbal cueing Max wheelchair distance: 20 ft     Wheelchair 50 feet with 2 turns activity    Assist    Wheelchair 50 feet with 2 turns activity did not occur: Safety/medical concerns       Wheelchair 150 feet activity     Assist Wheelchair 150 feet activity did not occur: Safety/medical concerns        Medical Problem List and Plan: 1.   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 Antithrombotics: -DVT/anticoagulation: Mechanical: Sequential compression devices, below knee Bilateral lower extremities -antiplatelet therapy: ASA, Brilinta on hold, will need neurosurgery input in terms of changing aspirin to Plavix 3. Pain Management:N/A 4. Mood:LCSW to follow for evaluation and support. -antipsychotic agents: N/A  -check sleep chart  -trazodone prn for sleep 5. Neuropsych: This patientis not fullycapable of making decisions on hisown behalf. 6. Skin/Wound Care:Monitor wound for healing. 7. Fluids/Electrolytes/Nutrition:Monitor I/O. Check lytes in am.  8. ICM/CAD s/p STEMI/DES: On low dose ASA. Will continue to hold lipitor (was not taking at home?) 9. Intermittent hypoxia: -cxr reviewed and without disease   - check weights 10. CKD?: Cr WNL 3/27 11. ABLA: hgb up to 12.8 3/27.  12. Prediabetes?: FBS 120-130 range. 123 this am on bmet   -dc cbg checks, follow up as outpt 13. Seizure prophylaxis: Continue Keppra bid.  No seizure activity    LOS: 2 days A FACE TO FACE EVALUATION WAS PERFORMED  Erick Colace 07/02/2018, 11:08 AM

## 2018-07-03 ENCOUNTER — Inpatient Hospital Stay (HOSPITAL_COMMUNITY): Payer: Medicare Other | Admitting: Physical Therapy

## 2018-07-03 ENCOUNTER — Inpatient Hospital Stay (HOSPITAL_COMMUNITY): Payer: Medicare Other

## 2018-07-03 ENCOUNTER — Inpatient Hospital Stay (HOSPITAL_COMMUNITY): Payer: Medicare Other | Admitting: Speech Pathology

## 2018-07-03 NOTE — Progress Notes (Signed)
Slept good. Woke up at 0500. Sitting in WC with alarm belt in place and telesitter, watching TV. Honeycomb dressings X 2 to scalp. Denies pain. Perseverating on going home.Craig [name]

## 2018-07-03 NOTE — Progress Notes (Signed)
[place] PHYSICAL MEDICINE & REHABILITATION PROGRESS NOTE   Subjective/Complaints:  No issues overnite, asking about d/c date  ROS: Limited due to cognitive/behavioral    Objective:   Vas [place] Lower Extremity Venous (dvt)  Result Date: 07/01/2018  Lower Venous Study Indications: Edema.  Performing Technologist: Toma Deiters RVS  Examination Guidelines: A complete evaluation includes B-mode imaging, spectral Doppler, color Doppler, and power Doppler as needed of all accessible portions of each vessel. Bilateral testing is considered an integral part of a complete examination. Limited examinations for reoccurring indications may be performed as noted.  Right Venous Findings: +---------+---------------+---------+-----------+----------+-------+          CompressibilityPhasicitySpontaneityPropertiesSummary +---------+---------------+---------+-----------+----------+-------+ CFV      Full           Yes      Yes                          +---------+---------------+---------+-----------+----------+-------+ SFJ      Full                                                 +---------+---------------+---------+-----------+----------+-------+ FV Prox  Full           Yes      Yes                          +---------+---------------+---------+-----------+----------+-------+ FV Mid   Full                                                 +---------+---------------+---------+-----------+----------+-------+ FV DistalFull           Yes      Yes                          +---------+---------------+---------+-----------+----------+-------+ PFV      Full           Yes      Yes                          +---------+---------------+---------+-----------+----------+-------+ POP      Full           Yes      Yes                          +---------+---------------+---------+-----------+----------+-------+ PTV      Full                                                  +---------+---------------+---------+-----------+----------+-------+ PERO     Full                                                 +---------+---------------+---------+-----------+----------+-------+  Left Venous Findings: +---------+---------------+---------+-----------+----------+-------+          CompressibilityPhasicitySpontaneityPropertiesSummary +---------+---------------+---------+-----------+----------+-------+ CFV  Full           Yes      Yes                          +---------+---------------+---------+-----------+----------+-------+ SFJ      Full                                                 +---------+---------------+---------+-----------+----------+-------+ FV Prox  Full           Yes      Yes                          +---------+---------------+---------+-----------+----------+-------+ FV Mid   Full                                                 +---------+---------------+---------+-----------+----------+-------+ FV DistalFull           Yes      Yes                          +---------+---------------+---------+-----------+----------+-------+ PFV      Full           Yes      Yes                          +---------+---------------+---------+-----------+----------+-------+ POP      Full           Yes      Yes                          +---------+---------------+---------+-----------+----------+-------+ PTV      Full                                                 +---------+---------------+---------+-----------+----------+-------+ PERO     Full                                                 +---------+---------------+---------+-----------+----------+-------+    Summary: Right: There is no evidence of deep vein thrombosis in the lower extremity. No cystic structure found in the popliteal fossa. Left: There is no evidence of deep vein thrombosis in the lower extremity. No cystic structure found in the popliteal fossa.  *See  table(s) above for measurements and observations. Electronically signed by Waverly Ferrari MD on 07/01/2018 at 5:21:34 PM.    Final    Recent Labs    07/01/18 0535  WBC 9.4  HGB 12.8*  HCT 36.3*  PLT 142*   Recent Labs    07/01/18 0535  NA 135  K 4.0  CL 98  CO2 25  GLUCOSE 123*  BUN 13  CREATININE 1.17  CALCIUM 9.2    Intake/Output Summary (Last 24 hours) at 07/03/2018 0955 Last data filed at 07/02/2018 2300 Gross  per 24 hour  Intake 580 ml  Output -  Net 580 ml     Physical Exam: Vital Signs Blood pressure 123/74, pulse 93, temperature 98.4 F (36.9 C), temperature source Oral, resp. rate 16, SpO2 96 %. Constitutional: No distress . Vital signs reviewed. HEENT: EOMI, oral membranes moist, scalp dressings in place, wounds dry. Neck: supple Cardiovascular: RRR without murmur. No JVD    Respiratory: CTA Bilaterally without wheezes or rales. Normal effort    GI: BS +, non-tender, non-distended  Extremities minimal pedal edema Skin without breakdown on heels, no rash  Neurological: able to tell me he was in the hospital when given cues. Knew the month. .  Motor strength is 4+ to 5/5 bilateral deltoid bicep tricep grip hip flexor knee extensor ankle dorsiflexor. Reasonable dexterity. Normal sensation.  Sensation intact light touch bilateral upper and lower limbs Psychiatric: flat, cooperative    Assessment/Plan: 1. Functional deficits secondary to TBI/SDH which require 3+ hours per day of interdisciplinary therapy in a comprehensive inpatient rehab setting.  Physiatrist is providing close team supervision and 24 hour management of active medical problems listed below.  Physiatrist and rehab team continue to assess barriers to discharge/monitor patient progress toward functional and medical goals  Care Tool:  Bathing    Body parts bathed by patient: Right arm, Left arm, Chest, Abdomen, Front perineal area, Buttocks, Right upper leg, Left upper leg, Right lower  leg, Left lower leg, Face         Bathing assist Assist Level: Contact Guard/Touching assist     Upper Body Dressing/Undressing Upper body dressing   What is the patient wearing?: Pull over shirt    Upper body assist Assist Level: Contact Guard/Touching assist(standing)    Lower Body Dressing/Undressing Lower body dressing      What is the patient wearing?: Underwear/pull up, Pants     Lower body assist Assist for lower body dressing: Contact Guard/Touching assist     Toileting Toileting    Toileting assist Assist for toileting: Contact Guard/Touching assist     Transfers Chair/bed transfer  Transfers assist     Chair/bed transfer assist level: Contact Guard/Touching assist     Locomotion Ambulation   Ambulation assist      Assist level: Minimal Assistance - Patient > 75% Assistive device: Other (comment)(none) Max distance: 200 ft   Walk 10 feet activity   Assist     Assist level: Minimal Assistance - Patient > 75% Assistive device: Other (comment)(none)   Walk 50 feet activity   Assist    Assist level: Minimal Assistance - Patient > 75% Assistive device: Other (comment)(none)    Walk 150 feet activity   Assist    Assist level: Minimal Assistance - Patient > 75% Assistive device: Other (comment)(none)    Walk 10 feet on uneven surface  activity   Assist     Assist level: Minimal Assistance - Patient > 75%     Wheelchair     Assist Will patient use wheelchair at discharge?: No Type of Wheelchair: Manual    Wheelchair assist level: Supervision/Verbal cueing Max wheelchair distance: 20 ft     Wheelchair 50 feet with 2 turns activity    Assist    Wheelchair 50 feet with 2 turns activity did not occur: Safety/medical concerns       Wheelchair 150 feet activity     Assist Wheelchair 150 feet activity did not occur: Safety/medical concerns        Medical Problem List and  Plan: 1.   Traumatic brain  injury secondary to fall with subdural hematoma  --Continue CIR therapies including PT, OT, and SLP - we discussed that his d/c date will be set on [place]. Antithrombotics: -DVT/anticoagulation: Mechanical: Sequential compression devices, below knee Bilateral lower extremities -antiplatelet therapy: ASA, Brilinta on hold, will need neurosurgery input in terms of changing aspirin to Plavix 3. Pain Management:N/A 4. Mood:LCSW to follow for evaluation and support. -antipsychotic agents: N/A  -check sleep chart  -trazodone prn for sleep 5. Neuropsych: This patientis not fullycapable of making decisions on hisown behalf. 6. Skin/Wound Care:Monitor wound for healing. 7. Fluids/Electrolytes/Nutrition:Monitor I/O. Check lytes in am.  8. ICM/CAD s/p STEMI/DES: On low dose ASA. Will continue to hold lipitor (was not taking at home?) 9. Intermittent hypoxia: -cxr reviewed and without disease   - check weights 10. CKD?: Cr WNL 3/27 11. ABLA: hgb up to 12.8 3/27.  12. Prediabetes?: FBS 120-130 range.  -dc cbg checks, follow up as outpt 13. Seizure prophylaxis: Continue Keppra bid.  No seizure activity    LOS: 3 days A FACE TO FACE EVALUATION WAS PERFORMED  Erick Colace 07/03/2018, 9:55 AM

## 2018-07-03 NOTE — Plan of Care (Signed)
  Problem: Consults Goal: RH BRAIN INJURY PATIENT EDUCATION Description Description: See Patient Education module for eduction specifics Outcome: Progressing   Problem: RH SKIN INTEGRITY Goal: RH STG SKIN FREE OF INFECTION/BREAKDOWN Description Patients skin will remain free from further infection or breakdown with min assist.  Outcome: Progressing Goal: RH STG MAINTAIN SKIN INTEGRITY WITH ASSISTANCE Description STG Maintain Skin Integrity With min Assistance.  Outcome: Progressing Goal: RH STG ABLE TO PERFORM INCISION/WOUND CARE W/ASSISTANCE Description STG Able To Perform Incision/Wound Care With min Assistance.  Outcome: Progressing   Problem: RH SAFETY Goal: RH STG ADHERE TO SAFETY PRECAUTIONS W/ASSISTANCE/DEVICE Description STG Adhere to Safety Precautions With min Assistance/Device.  Outcome: Progressing   Problem: RH COGNITION-NURSING Goal: RH STG USES MEMORY AIDS/STRATEGIES W/ASSIST TO PROBLEM SOLVE Description STG Uses Memory Aids/Strategies With min Assistance to Problem Solve.  Outcome: Progressing   Problem: RH KNOWLEDGE DEFICIT BRAIN INJURY Goal: RH STG INCREASE KNOWLEDGE OF SELF CARE AFTER BRAIN INJURY Outcome: Progressing

## 2018-07-04 ENCOUNTER — Inpatient Hospital Stay (HOSPITAL_COMMUNITY): Payer: Medicare Other

## 2018-07-04 ENCOUNTER — Inpatient Hospital Stay (HOSPITAL_COMMUNITY): Payer: Medicare Other | Admitting: Speech Pathology

## 2018-07-04 LAB — BASIC METABOLIC PANEL
Anion gap: 15 (ref 5–15)
BUN: 19 mg/dL (ref 8–23)
CO2: 22 mmol/L (ref 22–32)
Calcium: 9.2 mg/dL (ref 8.9–10.3)
Chloride: 99 mmol/L (ref 98–111)
Creatinine, Ser: 1.1 mg/dL (ref 0.61–1.24)
GFR calc Af Amer: >60 mL/min (ref >60)
GFR calc non Af Amer: >60 mL/min (ref >60)
Glucose, Bld: 138 mg/dL — ABNORMAL HIGH (ref 70–99)
Potassium: 3.7 mmol/L (ref 3.5–5.1)
Sodium: 136 mmol/L (ref 135–145)

## 2018-07-04 LAB — CBC
HCT: 38.1 % — ABNORMAL LOW (ref 39.0–52.0)
Hemoglobin: 13.4 g/dL (ref 13.0–17.0)
MCH: 30.4 pg (ref 26.0–34.0)
MCHC: 35.2 g/dL (ref 30.0–36.0)
MCV: 86.4 fL (ref 80.0–100.0)
Platelets: 206 10*3/uL (ref 150–400)
RBC: 4.41 MIL/uL (ref 4.22–5.81)
RDW: 12.1 % (ref 11.5–15.5)
WBC: 7.5 10*3/uL (ref 4.0–10.5)
nRBC: 0 % (ref 0.0–0.2)

## 2018-07-04 NOTE — Progress Notes (Signed)
Occupational Therapy Session Note  Patient Details  Name: Craig [name] MRN: [id number] Date of Birth: 1934-10-15  Today's Date: 07/04/2018 OT Individual Time: 9390-3009 OT Individual Time Calculation (min): 58 min  and Today's Date: 07/04/2018     Short Term Goals: Week 1:  OT Short Term Goal 1 (Week 1): Pt will stand at sink wiht CGA for oral care to demo improved standing tolerance/endruance OT Short Term Goal 2 (Week 1): Pt will thread BUE into shirt to demo improved sequencing OT Short Term Goal 3 (Week 1): Pt will complete toilet transfer wiht CGA OT Short Term Goal 4 (Week 1): Pt will thread BLE into pants wiht VC only  Skilled Therapeutic Interventions/Progress Updates:    Session focused on bathing/dressing at sink level. Pt with no c/o pain. Pt initiated bathing with no cueing. Pt required CGA for balance assist to sink with no AD. Pt completed UB bathing/dressing in standing with intermittent UE support on sink. Pt required cueing for safety to complete LB dressing seated. Pt required CGA for LB dressing sit <> standing. Pt then completed functional mobility into bathroom with 1 moderate LOB to the R, requiring mod A to correct. Pt completed toileting tasks with CGA. Pt utilizes an extremely forward flexed posture to complete peri hygiene but experienced no LOB during. Pt completed 100 ft of functional mobility with min-CGA. Pt returned to room and handed off to SLP.   Therapy Documentation Precautions:  Precautions Precautions: Fall Precaution Comments: recent burr hole surgery Restrictions Weight Bearing Restrictions: No Pain:  No pain reported   Therapy/Group: Individual Therapy  Crissie Reese 07/04/2018, 7:25 AM

## 2018-07-04 NOTE — Plan of Care (Signed)
  Problem: Consults Goal: [facility] BRAIN INJURY PATIENT EDUCATION Description Description: See Patient Education module for eduction specifics 07/04/2018 1727 by Kalman Shan, LPN Outcome: Progressing 07/04/2018 1725 by Kalman Shan, LPN Outcome: Progressing   Problem: RH SKIN INTEGRITY Goal: RH STG SKIN FREE OF INFECTION/BREAKDOWN Description Patients skin will remain free from further infection or breakdown with min assist.  07/04/2018 1727 by Kalman Shan, LPN Outcome: Progressing 07/04/2018 1725 by Kalman Shan, LPN Outcome: Progressing Goal: RH STG MAINTAIN SKIN INTEGRITY WITH ASSISTANCE Description STG Maintain Skin Integrity With min Assistance.  07/04/2018 1727 by Kalman Shan, LPN Outcome: Progressing 07/04/2018 1725 by Kalman Shan, LPN Outcome: Progressing Goal: RH STG ABLE TO PERFORM INCISION/WOUND CARE W/ASSISTANCE Description STG Able To Perform Incision/Wound Care With min Assistance.  07/04/2018 1727 by Kalman Shan, LPN Outcome: Progressing 07/04/2018 1725 by Doran Durand A, LPN Outcome: Progressing   Problem: RH SAFETY Goal: RH STG ADHERE TO SAFETY PRECAUTIONS W/ASSISTANCE/DEVICE Description STG Adhere to Safety Precautions With min Assistance/Device.  07/04/2018 1727 by Kalman Shan, LPN Outcome: Progressing 07/04/2018 1725 by Kalman Shan, LPN Outcome: Progressing   Problem: RH COGNITION-NURSING Goal: RH STG USES MEMORY AIDS/STRATEGIES W/ASSIST TO PROBLEM SOLVE Description STG Uses Memory Aids/Strategies With min Assistance to Problem Solve.  07/04/2018 1727 by Kalman Shan, LPN Outcome: Progressing 07/04/2018 1725 by Kalman Shan, LPN Outcome: Progressing   Problem: RH KNOWLEDGE DEFICIT BRAIN INJURY Goal: RH STG INCREASE KNOWLEDGE OF SELF CARE AFTER BRAIN INJURY 07/04/2018 1727 by Kalman Shan, LPN Outcome: Progressing 07/04/2018 1725 by Kalman Shan, LPN Outcome: Progressing

## 2018-07-04 NOTE — Plan of Care (Signed)
  Problem: Consults Goal: RH BRAIN INJURY PATIENT EDUCATION Description Description: See Patient Education module for eduction specifics Outcome: Progressing   Problem: RH SKIN INTEGRITY Goal: RH STG SKIN FREE OF INFECTION/BREAKDOWN Description Patients skin will remain free from further infection or breakdown with min assist.  Outcome: Progressing Goal: RH STG MAINTAIN SKIN INTEGRITY WITH ASSISTANCE Description STG Maintain Skin Integrity With min Assistance.  Outcome: Progressing Goal: RH STG ABLE TO PERFORM INCISION/WOUND CARE W/ASSISTANCE Description STG Able To Perform Incision/Wound Care With min Assistance.  Outcome: Progressing   Problem: RH SAFETY Goal: RH STG ADHERE TO SAFETY PRECAUTIONS W/ASSISTANCE/DEVICE Description STG Adhere to Safety Precautions With min Assistance/Device.  Outcome: Progressing   Problem: RH COGNITION-NURSING Goal: RH STG USES MEMORY AIDS/STRATEGIES W/ASSIST TO PROBLEM SOLVE Description STG Uses Memory Aids/Strategies With min Assistance to Problem Solve.  Outcome: Progressing   Problem: RH KNOWLEDGE DEFICIT BRAIN INJURY Goal: RH STG INCREASE KNOWLEDGE OF SELF CARE AFTER BRAIN INJURY Outcome: Progressing   

## 2018-07-04 NOTE — Progress Notes (Signed)
Physical Therapy Session Note  Patient Details  Name: Craig [name] MRN: [id number] Date of Birth: December 14, 1934  Today's Date: 07/04/2018 PT Individual Time:Session1: 1030-1130; [place]: 4356-8616 PT Individual Time Calculation (min): 60 min & 45 min  Short Term Goals: Week 1:  PT Short Term Goal 1 (Week 1): Pt will negotiate 2 steps without rails with min assist.  PT Short Term Goal 2 (Week 1): Pt will ambulate 75 ft with LRAD & supervision.  PT Short Term Goal 3 (Week 1): Pt will complete floor transfer with min assist.   Skilled Therapeutic Interventions/Progress Updates:    Session1: Patient in supine, reports eager for d/c home hopeful for Wednesday.  Patient supine to sit with S.  Sit to stand CGA for safety/balance.  Gait partway to gym CGA, then reports had shoes in his room and eager to wear, so returned to room and pt donned tall boots seated EOB with S. Gait with boots with slight improvement in stability to ortho gym.  On ramp side stepping up and down and forward and backwards up and down with CGA for safety and cues for technique.  Practiced car transfer to "Gator" height with S to CGA and cues for technique.  Patient negotiated flight of stairs x 2 reps with R handrail and min A initially step through technique with LOB due to not placing foot fully on step, then cues for step to and initiated with safer technique, but reverts to step through pattern, but no noted LOB.  Patient in general gym performed DGI.  Score as noted below 20/24, but some staggering/veering with head turns.  Gait tossing and catching medium ball to self, then bounce and catch to self.  Then static tossing ball over shoulder to PT in behind and catching over opposite shoulder.  Then gait with ball toss as previous with CGA and cues for safety due to pt attempt to retrieve ball from floor when missed catch.  Patient ambulated to room and left in recliner near window with seatbelt alarm and call bell in  reach.  Session2:  Patient in supine, but with boots on and ready.  S for bed mobility and gait with close S to dayroom.  Performed 3 bouts on Kinetron at 20 cm/sec seated first bout, then standing next two with UE support about 1.5-2 min per bout.  HR max 107, SpO2 94%.  Patient ambulated to general gym to perform LOS with medium and hard difficulty with dynamic surface at level 7 with bilateral, then 1 then no UE support and CGA.  Dynamic gait with large ball toss over shoulder as noted above, then with small whiffle ball toss to self with CGA.  Patient ambulated to room and left in supine with call button in reach and bed alarm on.   Therapy Documentation Precautions:  Precautions Precautions: Fall Precaution Comments: recent burr hole surgery Restrictions Weight Bearing Restrictions: No Pain: Pain Assessment Pain Score: 0-No pain  Balance: Standardized Balance Assessment Standardized Balance Assessment: Dynamic Gait Index Dynamic Gait Index Level Surface: Normal Change in Gait Speed: Normal Gait with Horizontal Head Turns: Mild Impairment Gait with Vertical Head Turns: Mild Impairment Gait and Pivot Turn: Normal Step Over Obstacle: Normal Step Around Obstacles: Normal Steps: Moderate Impairment Total Score: 20    Therapy/Group: Individual Therapy  Elray Mcgregor  [place], PT 07/04/2018, 11:04 AM

## 2018-07-04 NOTE — Progress Notes (Signed)
Speech Language Pathology Daily Session Note  Patient Details  Name: Craig [name] MRN: [id number] Date of Birth: 12/06/1934  Today's Date: 07/04/2018 SLP Individual Time: 0830-0925 SLP Individual Time Calculation (min): 55 min  Short Term Goals: Week 1: SLP Short Term Goal 1 (Week 1): Patient will demonstrate sustained attention to functional tasks for 45 minutes with Min A verbal cues for redirection.  SLP Short Term Goal 2 (Week 1): Patient will demonstrate functional problem solving for basic and familiar tasks with Min A verbal cues.  SLP Short Term Goal 3 (Week 1): Patient will self-monitor and correct errors during functional tasks with Min A verbal cues.  SLP Short Term Goal 4 (Week 1): Patient will recall new, daily information with Mod A multimodal cues.   Skilled Therapeutic Interventions: Skilled treatment session focused on cognitive goals. SLP facilitated session by providing supervision verbal cues for recall of his current medications but was Mod I for organizing a BID pill box.  Patient intermittently perseverative on going home but was easily redirected. Patient also demonstrated increased anticipatory awareness in regards to letting his grandsons take over taking care of his land since most of his falls appear to occur outside. Patient left upright in wheelchair with alarm on and all needs within reach. Continue with current plan of care.      Pain No/Denies Pain   Therapy/Group: Individual Therapy  Merton Wadlow 07/04/2018, 10:49 AM

## 2018-07-04 NOTE — Care Management (Signed)
Inpatient Rehabilitation Center Individual Statement of Services  Patient Name:  Craig [name]  Date:  07/04/2018  Welcome to the Inpatient Rehabilitation Center.  Our goal is to provide you with an individualized program based on your diagnosis and situation, designed to meet your specific needs.  With this comprehensive rehabilitation program, you will be expected to participate in at least 3 hours of rehabilitation therapies Monday-Friday, with modified therapy programming on the weekends.  Your rehabilitation program will include the following services:  Physical Therapy (PT), Occupational Therapy (OT), Speech Therapy (ST), 24 hour per day rehabilitation nursing, Therapeutic Recreaction (TR), Neuropsychology, Case Management (Social Worker), Rehabilitation Medicine, Nutrition Services and Pharmacy Services  Weekly team conferences will be held on Tuesdays to discuss your progress.  Your Social Worker will talk with you frequently to get your input and to update you on team discussions.  Team conferences with you and your family in attendance may also be held.  Expected length of stay: 12-14 days   Overall anticipated outcome: supervision  Depending on your progress and recovery, your program may change. Your Social Worker will coordinate services and will keep you informed of any changes. Your Social Worker's name and contact numbers are listed  below.  The following services may also be recommended but are not provided by the Inpatient Rehabilitation Center:   Driving Evaluations  Home Health Rehabiltiation Services  Outpatient Rehabilitation Services   Arrangements will be made to provide these services after discharge if needed.  Arrangements include referral to agencies that provide these services.  Your insurance has been verified to be:  [facility] Medicare Your primary doctor is:  [facility]  Pertinent information will be shared with your doctor and your  insurance company.  Social Worker:  Rodri­guez [place], [place] [phone] or [phone]   Information discussed with and copy given to patient by: Amada Jupiter, 07/04/2018, 10:29 AM

## 2018-07-05 ENCOUNTER — Inpatient Hospital Stay (HOSPITAL_COMMUNITY): Payer: Medicare Other | Admitting: Physical Therapy

## 2018-07-05 ENCOUNTER — Inpatient Hospital Stay (HOSPITAL_COMMUNITY): Payer: Medicare Other

## 2018-07-05 ENCOUNTER — Telehealth: Payer: Self-pay | Admitting: Interventional Cardiology

## 2018-07-05 ENCOUNTER — Inpatient Hospital Stay (HOSPITAL_COMMUNITY): Payer: Medicare Other | Admitting: Speech Pathology

## 2018-07-05 ENCOUNTER — Other Ambulatory Visit: Payer: Self-pay

## 2018-07-05 ENCOUNTER — Ambulatory Visit: Payer: Medicare Other | Admitting: Interventional Cardiology

## 2018-07-05 ENCOUNTER — Inpatient Hospital Stay (HOSPITAL_COMMUNITY): Payer: Medicare Other | Admitting: Occupational Therapy

## 2018-07-05 NOTE — Progress Notes (Signed)
Occupational Therapy Session Note  Patient Details  Name: Craig [name] MRN: [id number] Date of Birth: [birth year]  Today's Date: 07/05/2018 OT Individual Time: 0930-1000 OT Individual Time Calculation (min): 30 min    Short Term Goals: Week 1:  OT Short Term Goal 1 (Week 1): Pt will stand at sink wiht CGA for oral care to demo improved standing tolerance/endruance OT Short Term Goal 2 (Week 1): Pt will thread BUE into shirt to demo improved sequencing OT Short Term Goal 3 (Week 1): Pt will complete toilet transfer wiht CGA OT Short Term Goal 4 (Week 1): Pt will thread BLE into pants wiht VC only  Skilled Therapeutic Interventions/Progress Updates:    Pt received in w/c in his room and agreeable to therapy.  Pt feels he is ready to go home and would like to leave on Thursday. Explained to him that it is a team decision and they would let him know soon.    Pt ambulated to gym with Supervision.  In the gym he worked on Upper back exercises to improve postural control and strength needed for balance and ambulation.  He worked with Level 3 Orange band with arm extension exercises and rows with resistance.  Pt tolerated exercises well.  He was able to stand and pick up a small item off the floor with S.    Pt able to recall his recent medical history clearly. Pt ambulated back to room and sat back in w/c with belt alarm on and all needs met.   Therapy Documentation Precautions:  Precautions Precautions: Fall Precaution Comments: recent burr hole surgery Restrictions Weight Bearing Restrictions: No       Pain: Pain Assessment Pain Score: 0-No pain  Therapy/Group: Individual Therapy  [place] 07/05/2018, 12:27 PM

## 2018-07-05 NOTE — Plan of Care (Signed)
  Problem: Consults Goal: RH BRAIN INJURY PATIENT EDUCATION Description Description: See Patient Education module for eduction specifics Outcome: Progressing   Problem: RH SKIN INTEGRITY Goal: RH STG SKIN FREE OF INFECTION/BREAKDOWN Description Patients skin will remain free from further infection or breakdown with min assist.  Outcome: Progressing Goal: RH STG MAINTAIN SKIN INTEGRITY WITH ASSISTANCE Description STG Maintain Skin Integrity With min Assistance.  Outcome: Progressing Goal: RH STG ABLE TO PERFORM INCISION/WOUND CARE W/ASSISTANCE Description STG Able To Perform Incision/Wound Care With min Assistance.  Outcome: Progressing   Problem: RH SAFETY Goal: RH STG ADHERE TO SAFETY PRECAUTIONS W/ASSISTANCE/DEVICE Description STG Adhere to Safety Precautions With min Assistance/Device.  Outcome: Progressing   Problem: RH COGNITION-NURSING Goal: RH STG USES MEMORY AIDS/STRATEGIES W/ASSIST TO PROBLEM SOLVE Description STG Uses Memory Aids/Strategies With min Assistance to Problem Solve.  Outcome: Progressing   Problem: RH KNOWLEDGE DEFICIT BRAIN INJURY Goal: RH STG INCREASE KNOWLEDGE OF SELF CARE AFTER BRAIN INJURY Outcome: Progressing   

## 2018-07-05 NOTE — Progress Notes (Signed)
Speech Language Pathology Daily Session Note  Patient Details  Name: CRESCENCIO MARKU MRN: [id number] Date of Birth: 09-22-1934  Today's Date: 07/05/2018 SLP Individual Time: 1306-1400 SLP Individual Time Calculation (min): 54 min  Short Term Goals: Week 1: SLP Short Term Goal 1 (Week 1): Patient will demonstrate sustained attention to functional tasks for 45 minutes with Min A verbal cues for redirection.  SLP Short Term Goal 2 (Week 1): Patient will demonstrate functional problem solving for basic and familiar tasks with Min A verbal cues.  SLP Short Term Goal 3 (Week 1): Patient will self-monitor and correct errors during functional tasks with Min A verbal cues.  SLP Short Term Goal 4 (Week 1): Patient will recall new, daily information with Mod A multimodal cues.   Skilled Therapeutic Interventions:  Pt was seen for skilled ST targeting cognitive goals.  Pt perseverative on going home but was redirectable.  SLP facilitated the session with a novel card game targeting memory and problem solving goals.  Pt initially needed min assist verbal cues to plan and execute a problem solving strategy due to decreased working memory of task rules and procedures; however, as task progressed therapist was able to fade cues to supervision.  Pt was returned to room and left in chair with chair alarm set.  Continue per current plan of care.    Pain Pain Assessment Pain Score: 0-No pain  Therapy/Group: Individual Therapy  Karine Garn, Melanee Spry 07/05/2018, 1:55 PM

## 2018-07-05 NOTE — Progress Notes (Signed)
Occupational Therapy Session Note  Patient Details  Name: Craig [name] MRN: [id number] Date of Birth: [birth year]  Today's Date: 07/05/2018 OT Individual Time: 5369-2230 OT Individual Time Calculation (min): 41 min    Short Term Goals: Week 1:  OT Short Term Goal 1 (Week 1): Pt will stand at sink wiht CGA for oral care to demo improved standing tolerance/endruance OT Short Term Goal 2 (Week 1): Pt will thread BUE into shirt to demo improved sequencing OT Short Term Goal 3 (Week 1): Pt will complete toilet transfer wiht CGA OT Short Term Goal 4 (Week 1): Pt will thread BLE into pants wiht VC only   Skilled Therapeutic Interventions/Progress Updates:    Session focused on bathing/dressing at the sink. Pt completed functional mobility into bathroom with close (S) and completed toileting tasks. Pt doffed pants, completed peri hygiene and donned clean pants with close (S). Pt completed remainder of UB bathing and dressing standing at sink with (S). Pt completed shaving and oral care task standing with (S). Previous edu reinforced re safety and balance deficits. Pt requested to have a picture of his incisions on head. A picture was taken of pt's head (without any identifiers present) and a printed copy was provided to pt. Pt was left sitting up in w/c with all needs met, chair alarm met.   Therapy Documentation Precautions:  Precautions Precautions: Fall Precaution Comments: recent burr hole surgery Restrictions Weight Bearing Restrictions: No Pain:  No pain reported   Therapy/Group: Individual Therapy  Curtis Sites 07/05/2018, 7:15 AM

## 2018-07-05 NOTE — Patient Care Conference (Signed)
Inpatient RehabilitationTeam Conference and Plan of Care Update Date: 07/05/2018   Time: 2:45 PM    Patient Name: Craig [name]      Medical Record Number: [id number]  Date of Birth: [birth year] Sex: Male         Room/Bed: 4W16C/4W16C-01 Payor Info: Payor: Multimedia programmer / Plan: [employer] MEDICARE / Product Type: *No Product type* /    Admitting Diagnosis: b sdh  Admit Date/Time:  06/30/2018  3:30 PM Admission Comments: No comment available   Primary Diagnosis:  Traumatic brain injury with loss of consciousness (HCC) Principal Problem: Traumatic brain injury with loss of consciousness [facility])  Patient Active Problem List   Diagnosis Date Noted  . Subdural hemorrhage, postinjury (HCC) 06/30/2018  . Stage 3 chronic kidney disease (HCC)   . Benign essential HTN   . Cognitive deficit as late effect of traumatic brain injury (HCC)   . Traumatic brain injury with loss of consciousness (HCC)   . Coronary artery disease involving native artery of transplanted heart without angina pectoris   . Subdural hematoma (HCC) 06/27/2018  . SDH (subdural hematoma) (HCC) 06/27/2018  . Bilateral subdural hematomas (HCC) 06/24/2018  . Tobacco use 06/01/2018  . Status post coronary artery stent placement   . Acute ST elevation myocardial infarction (STEMI) of inferolateral wall (HCC) 05/30/2018  . Essential hypertension 05/30/2018  . Hyperlipidemia LDL goal <70 05/30/2018  . Acute ST elevation myocardial infarction (STEMI) of lateral wall (HCC) 05/30/2018    Expected Discharge Date: Expected Discharge Date: 07/07/18  Team Members Present: Physician leading conference: Dr. Faith Rogue Social Worker Present: Amada Jupiter, LCSW Nurse Present: Allayne Stack, RN PT Present: Aleda Grana, PT OT Present: Other (comment)(Sandra Earlene Plater, OT) SLP Present: Feliberto Gottron, SLP PPS Coordinator present : Fae Pippin     Current Status/Progress Goal Weekly Team Focus  Medical   bilateral SDH,  improved balance and mobility. ongoing cognitive issues. off brilinta d/t sdh.   stabilize medically for discharge  safety awareness, CV control, surgical wound mgt.    Bowel/Bladder   continent of b/b; LBM: 03/29  remain continent of b/b; gain regualr bowel pattern  assist with tolieting needs prn   Swallow/Nutrition/ Hydration             ADL's   CGA LB bathing, CGA toilet transfers, min cueing for safety  (S) overall for ADLs. 24/7 supervision for cognitive deficits  dynamic standing balance, developing anticipatory awareness, ADL retraining   Mobility   supervision transfers, CGA gait without AD, min assist stairs  supervision overall  NMR, balance, transfers, gait, stair negotiation, cognitive remediation   Communication             Safety/Cognition/ Behavioral Observations  min assist-supervision  min assist (may need to be upgraded pending LOS)  higher level memory and problem solving, asking to go home frequently throughout session   Pain   no c/o pain   reamin free of pain 0/10  assess pain qshift and prn   Skin   right and left head incisions with staples still intact  remain free of new skin infection/breakdown  assess qshift and prn     Rehab Goals Patient on target to meet rehab goals: Yes *See Care Plan and progress notes for long and short-term goals.     Barriers to Discharge  Current Status/Progress Possible Resolutions Date Resolved   Physician    Medical stability;Behavior  Nursing                  PT  Decreased caregiver support  unsure if pt has supervision upon d/c.              OT                  SLP                SW                Discharge Planning/Teaching Needs:  Plan to d/c home with family to provide 24/7 supervision.  Given supervision goals, all family ed to be completed with daughter, Victorino Dike, via phone.   Team Discussion:  Making excellent progress with medical and therapy.  MD to check with NS on resumption of blood  thinner.  Cont b/b.  Decreased safety awareness, however, improved problem solving.  Reaching supervision goals.    Revisions to Treatment Plan:  NA    Continued Need for Acute Rehabilitation Level of Care: The patient requires daily medical management by a physician with specialized training in physical medicine and rehabilitation for the following conditions: Daily direction of a multidisciplinary physical rehabilitation program to ensure safe treatment while eliciting the highest outcome that is of practical value to the patient.: Yes Daily medical management of patient stability for increased activity during participation in an intensive rehabilitation regime.: Yes Daily analysis of laboratory values and/or radiology reports with any subsequent need for medication adjustment of medical intervention for : Cardiac problems;Neurological problems;Post surgical problems   I attest that I was present, lead the team conference, and concur with the assessment and plan of the team.   Amada Jupiter 07/05/2018, 4:06 PM   Team conference was held via web/ teleconference due to COVID - 19

## 2018-07-05 NOTE — Progress Notes (Signed)
Social Work Patient ID: Craig [name], male   DOB: 1934/12/26, 84 y.o.   MRN: [id number]  Have reviewed team conference with pt and family (daughter, Victorino Dike via phone).  All very happy with progress and targeted d/c date of 07/07/18.  Daughter aware team continues to recommend 24/7 supervision.  Therapists planning to call daughter prior to d/c to review any safety awareness and other concerns.  Plan to have HH txs to start.  Continue to follow.  Ayden Hardwick, LCSW

## 2018-07-05 NOTE — Telephone Encounter (Signed)
New Message   Craig [name] has a question in the chart about changing the Brilinta to Plavix. She is trying to find out the conversation between the surgeon and cardiology so that she knows what medication to send patient home on. Please advise.

## 2018-07-05 NOTE — Progress Notes (Signed)
[place] PHYSICAL MEDICINE & REHABILITATION PROGRESS NOTE   Subjective/Complaints:  Pt had a good night. Feels that he's doing well and ready to go home. Tells me therapy told him he was going home tomorrow  ROS: Patient denies fever, rash, sore throat, blurred vision, nausea, vomiting, diarrhea, cough, shortness of breath or chest pain, joint or back pain, headache, or mood change.   Objective:   No results found. Recent Labs    07/04/18 0606  WBC 7.5  HGB 13.4  HCT 38.1*  PLT 206   Recent Labs    07/04/18 0606  NA 136  K 3.7  CL 99  CO2 22  GLUCOSE 138*  BUN 19  CREATININE 1.10  CALCIUM 9.2    Intake/Output Summary (Last 24 hours) at 07/05/2018 0857 Last data filed at 07/04/2018 1720 Gross per 24 hour  Intake 236 ml  Output -  Net 236 ml     Physical Exam: Vital Signs Blood pressure 123/66, pulse 86, temperature 98 F (36.7 C), temperature source Oral, resp. rate 18, SpO2 97 %. Constitutional: No distress . Vital signs reviewed. HEENT: EOMI, oral membranes moist, scalp staples/wounds intact Neck: supple Cardiovascular: RRR without murmur. No JVD    Respiratory: CTA Bilaterally without wheezes or rales. Normal effort    GI: BS +, non-tender, non-distended  Extremities minimal pedal edema Skin without breakdown on heels, no rash  Neurological: able to tell me he was in the hospital when given cues. Knew the month. .  Motor strength is 4+ to 5/5 bilateral deltoid bicep tricep grip hip flexor knee extensor ankle dorsiflexor. Reasonable dexterity. Normal sensation.  Sensation intact light touch bilateral upper and lower limbs Psychiatric: flat, cooperative    Assessment/Plan: 1. Functional deficits secondary to TBI/SDH which require 3+ hours per day of interdisciplinary therapy in a comprehensive inpatient rehab setting.  Physiatrist is providing close team supervision and 24 hour management of active medical problems listed below.  Physiatrist and rehab  team continue to assess barriers to discharge/monitor patient progress toward functional and medical goals  Care Tool:  Bathing    Body parts bathed by patient: Right arm, Left arm, Chest, Abdomen, Front perineal area, Buttocks, Right upper leg, Left upper leg, Right lower leg, Left lower leg, Face         Bathing assist Assist Level: Contact Guard/Touching assist     Upper Body Dressing/Undressing Upper body dressing   What is the patient wearing?: Pull over shirt    Upper body assist Assist Level: Supervision/Verbal cueing    Lower Body Dressing/Undressing Lower body dressing      What is the patient wearing?: Underwear/pull up, Pants     Lower body assist Assist for lower body dressing: Contact Guard/Touching assist     Toileting Toileting    Toileting assist Assist for toileting: Contact Guard/Touching assist     Transfers Chair/bed transfer  Transfers assist     Chair/bed transfer assist level: Supervision/Verbal cueing     Locomotion Ambulation   Ambulation assist      Assist level: Contact Guard/Touching assist Assistive device: Other (comment)(none) Max distance: 220'   Walk 10 feet activity   Assist     Assist level: Contact Guard/Touching assist Assistive device: Other (comment)(none)   Walk 50 feet activity   Assist    Assist level: Contact Guard/Touching assist Assistive device: Other (comment)(none)    Walk 150 feet activity   Assist    Assist level: Contact Guard/Touching assist Assistive device: Other (comment)(none)  Walk 10 feet on uneven surface  activity   Assist     Assist level: Contact Guard/Touching assist Assistive device: Other (comment)(none)   Wheelchair     Assist Will patient use wheelchair at discharge?: No Type of Wheelchair: Manual    Wheelchair assist level: Supervision/Verbal cueing Max wheelchair distance: 20 ft     Wheelchair 50 feet with 2 turns activity    Assist     Wheelchair 50 feet with 2 turns activity did not occur: Safety/medical concerns       Wheelchair 150 feet activity     Assist Wheelchair 150 feet activity did not occur: Safety/medical concerns        Medical Problem List and Plan: 1.   Traumatic brain injury secondary to fall with subdural hematoma  --Pt appears to be at supervision level  --Interdisciplinary Team Conference today  Told him we would discuss dc date 2. Antithrombotics: -DVT/anticoagulation: Mechanical: Sequential compression devices, below knee Bilateral lower extremities -antiplatelet therapy: ASA, Brilinta on hold, check with NS re: resuming brilinta 3. Pain Management:N/A 4. Mood:LCSW to follow for evaluation and support. -antipsychotic agents: N/A  - trazodone prn for sleep 5. Neuropsych: This patientis not fullycapable of making decisions on hisown behalf. 6. Skin/Wound Care: pt is 8 days out from surgery today---can remove scalp staples  7. Fluids/Electrolytes/Nutrition:Monitor I/O. Check lytes in am.  8. ICM/CAD s/p STEMI/DES: On low dose ASA. Will continue to hold lipitor (was not taking at home?) 9. Intermittent hypoxia: -cxr reviewed and without disease   - check weights 10. CKD?: Cr WNL 3/30 11. ABLA: hgb up to 13.4 3/30.  12. Prediabetes?: FBS 120-130 range.  -dc cbg checks, follow up as outpt 13. Seizure prophylaxis: Continue Keppra bid.  No seizure activity    LOS: 5 days A FACE TO FACE EVALUATION WAS PERFORMED  Ranelle Oyster 07/05/2018, 8:57 AM

## 2018-07-05 NOTE — Progress Notes (Signed)
Physical Therapy Session Note  Patient Details  Name: Craig [name] MRN: [id number] Date of Birth: 1934/05/16  Today's Date: 07/05/2018 PT Individual Time: 3382-5053 and 9767-3419 PT Individual Time Calculation (min): 70 min and 29 min  Short Term Goals: Week 1:  PT Short Term Goal 1 (Week 1): Pt will negotiate 2 steps without rails with min assist.  PT Short Term Goal 2 (Week 1): Pt will ambulate 75 ft with LRAD & supervision.  PT Short Term Goal 3 (Week 1): Pt will complete floor transfer with min assist.   Skilled Therapeutic Interventions/Progress Updates:  Treatment 1: Pt received in w/c & agreeable to tx. No c/o pain reported. Pt ambulates around unit without AD & CGA fade to close supervision; pt with 1 small LOB but able to correct without assistance. Pt asking to go home soon with therapist educating him on need for increased independence. Pt completes floor transfer with supervision. While in quadruped position pt elevates 1 extremity then progresses to bird dog with task focusing on core/trunk strengthening & balance. Pt transitions to tall kneeling while engaging in n-1 memory recall task with pt demonstrating 100% accuracy. Pt engaged in side stepping with cuing for technique and backwards gait with task focusing on weight shifting L<>R and dynamic balance. Pt utilized cybex kinetron in standing without BUE support and min assist with task focusing on BLE strengthening, dynamic balance, and weight shifting L<>R. Pt negotiates curb multiple times, initially catching RLE on step 2/2 impaired foot clearance with cuing to attend to & clear foot & improving return demo; pt negotiates step with close supervision reporting he has 1 step + 1 step to access his house. Pt utilized nu-step on level 6 x 6 minutes with all four extremities & task focusing on global strengthening, coordination of reciprocal movements, and R NMR.  At end of session pt left sitting in w/c with chair alarm donned &  all needs in reach.   Treatment 2: Pt received in w/c & agreeable to tx. No c/o pain reported. Pt ambulates around unit without AD & supervision with cuing to keep hands out of pocket. Educated pt on inability to drive until cleared by MD but pt unable to recall this without cuing after ambulating room>gym. Pt completes car transfer at small truck height with supervision. Pt completes couch transfer with supervision. Pt utilized dynavision while standing on foam with normal and narrow BOS with close supervision<>min assist with task focusing on balance. Pt does require cuing for pathfinding back to room. Pt left in w/c with all needs in reach & chair alarm donned.   Therapy Documentation Precautions:  Precautions Precautions: Fall Precaution Comments: recent burr hole surgery Restrictions Weight Bearing Restrictions: No   Therapy/Group: Individual Therapy  Sandi Mariscal 07/05/2018, 2:36 PM

## 2018-07-05 NOTE — Discharge Summary (Signed)
Physician Discharge Summary  Patient ID: [name] MRN: [id number] DOB/AGE: Mar 11, 1935 84 y.o.  Admit date: 06/30/2018 Discharge date: 07/07/2018  Discharge Diagnoses:  Principal Problem:   Traumatic brain injury with loss of consciousness [facility]) Active Problems:   Status post coronary artery stent placement   Bilateral subdural hematomas (HCC)   Stage 3 chronic kidney disease (HCC)   Benign essential HTN   Cognitive deficit as late effect of traumatic brain injury [facility]   Discharged Condition: stable   Significant Diagnostic Studies: Dg Chest 2 View  Result Date: 06/30/2018 CLINICAL DATA:  Shortness of breath. History of ischemic cardiomyopathy. EXAM: CHEST - 2 VIEW COMPARISON:  None. FINDINGS: Heart size is normal. Mediastinal shadows are normal. The lungs are clear. The vascularity is normal. No effusions. Incidental nipple shadows. IMPRESSION: Normal chest Electronically Signed   By: Paulina Fusi M.D.   On: 06/30/2018 16:37    Vas [place] Lower Extremity Venous (dvt)  Result Date: 07/01/2018  Lower Venous Study Indications: Edema.  Performing Technologist: Toma Deiters RVS  Examination Guidelines: A complete evaluation includes B-mode imaging, spectral Doppler, color Doppler, and power Doppler as needed of all accessible portions of each vessel. Bilateral testing is considered an integral part of a complete examination. Limited examinations for reoccurring indications may be performed as noted.  Right Venous Findings: +---------+---------------+---------+-----------+----------+-------+          CompressibilityPhasicitySpontaneityPropertiesSummary +---------+---------------+---------+-----------+----------+-------+ CFV      Full           Yes      Yes                          +---------+---------------+---------+-----------+----------+-------+ SFJ      Full                                                  +---------+---------------+---------+-----------+----------+-------+ FV Prox  Full           Yes      Yes                          +---------+---------------+---------+-----------+----------+-------+ FV Mid   Full                                                 +---------+---------------+---------+-----------+----------+-------+ FV DistalFull           Yes      Yes                          +---------+---------------+---------+-----------+----------+-------+ PFV      Full           Yes      Yes                          +---------+---------------+---------+-----------+----------+-------+ POP      Full           Yes      Yes                          +---------+---------------+---------+-----------+----------+-------+ PTV  Full                                                 +---------+---------------+---------+-----------+----------+-------+ PERO     Full                                                 +---------+---------------+---------+-----------+----------+-------+  Left Venous Findings: +---------+---------------+---------+-----------+----------+-------+          CompressibilityPhasicitySpontaneityPropertiesSummary +---------+---------------+---------+-----------+----------+-------+ CFV      Full           Yes      Yes                          +---------+---------------+---------+-----------+----------+-------+ SFJ      Full                                                 +---------+---------------+---------+-----------+----------+-------+ FV Prox  Full           Yes      Yes                          +---------+---------------+---------+-----------+----------+-------+ FV Mid   Full                                                 +---------+---------------+---------+-----------+----------+-------+ FV DistalFull           Yes      Yes                           +---------+---------------+---------+-----------+----------+-------+ PFV      Full           Yes      Yes                          +---------+---------------+---------+-----------+----------+-------+ POP      Full           Yes      Yes                          +---------+---------------+---------+-----------+----------+-------+ PTV      Full                                                 +---------+---------------+---------+-----------+----------+-------+ PERO     Full                                                 +---------+---------------+---------+-----------+----------+-------+    Summary: Right: There is no evidence of deep vein thrombosis in the  lower extremity. No cystic structure found in the popliteal fossa. Left: There is no evidence of deep vein thrombosis in the lower extremity. No cystic structure found in the popliteal fossa.  *See table(s) above for measurements and observations. Electronically signed by Waverly Ferrarihristopher Dickson MD on 07/01/2018 at 5:21:34 PM.    Final     Labs:  Basic Metabolic Panel: BMP Latest Ref Rng & Units 07/04/2018 07/01/2018 06/29/2018  Glucose 70 - 99 mg/dL 161(W138(H) 960(A123(H) 540(J113(H)  BUN 8 - 23 mg/dL 19 13 23   Creatinine 0.61 - 1.24 mg/dL 8.111.10 9.141.17 7.821.03  BUN/Creat Ratio 10 - 24 - - -  Sodium 135 - 145 mmol/L 136 135 135  Potassium 3.5 - 5.1 mmol/L 3.7 4.0 3.9  Chloride 98 - 111 mmol/L 99 98 105  CO2 22 - 32 mmol/L 22 25 21(L)  Calcium 8.9 - 10.3 mg/dL 9.2 9.2 9.5(A8.5(L)    CBC: CBC Latest Ref Rng & Units 07/04/2018 07/01/2018 06/30/2018  WBC 4.0 - 10.5 K/uL 7.5 9.4 7.2  Hemoglobin 13.0 - 17.0 g/dL 21.313.4 12.8(L) 11.4(L)  Hematocrit 39.0 - 52.0 % 38.1(L) 36.3(L) 33.8(L)  Platelets 150 - 400 K/uL 206 142(L) 135(L)    CBG: No results for input(s): GLUCAP in the last 168 hours.  Brief HPI:   Craig [name] is an 84 year old male with history of CKD, HTN, ICM/CAD with recent  STEMI treated with PCI/DES and was discharged to home on  ASA/Brillinta on 06/01/18.  He has had several falls since discharge to home and started having MS changes starting 06/19/18. He continued to worsen and evaluation in ED revealed large bilateral SDH of differing ages. Once cleared for surgery, he underwent bilateral burr hole evacuation of subacute SDH on 06/27/18. Post op, he continues to have issues with confusion, poor safety awareness and unsteady gait. CIR was recommended due to functional decline.    Hospital Course: Craig ParaCharles F Geisel was admitted to rehab 06/30/2018 for inpatient therapies to consist of PT, ST and OT at least three hours five days a week. Past admission physiatrist, therapy team and rehab RN have worked together to provide customized collaborative inpatient rehab. He showed increase WOB at admission and CXR done was negative for fluid overload or infection. As endurance improved, DOE has resolved. Check of lytes showed improvement in renal status and is WNL. BLE dopplers were negative for DVT and SCDs were used for DVT prophylaxis. Follow up CBC showed that ABLA and thrombocytopenia has resolved. He continues on low dose ASA.  Family has expressed concerns regarding Brilinta and were advised to discuss this further with Dr. Katrinka BlazingSmith.  Dr. Wynetta Emeryram was consulted for input on antiplatelet therapy and reported that Brilinta would likely be held for 4-6 weeks depending on resolution of SDH on CT and decision to be made past follow up appointment. He has been seizure free on Keppra bid. Due to hyperglycemia/pre-diabetes, BS were monitored for a few days and noted to be in 120-130 range. Crani incision is C/D/I and healing well without S/S of infection. Mood has been stable and he has made good gains during his rehab stay. He is currently at supervision level and will continue to receive follow up HHPT,HHOT and HHST by  [facility] after discharge.    Rehab course: During patient's stay in rehab team conference was held to monitor patient's  progress, set goals and discuss barriers to discharge. At admission, patient required min to max assist with basic self care tasks and min  assist with mobility.  He exhibited behaviors consistent with [place] with deficits in attention, problem solving, emergent awareness and recall. He showed severe impairments in short term memory.  He  has had improvement in activity tolerance, balance, postural control as well as ability to compensate for deficits.  He is able to complete ADL tasks with supervision.  He is independent for transfers and is able to ambulate 150' with supervision and verbal cues.  He requires supervision to min verbal cues for complex problem solving, recall of functional information, attention and anticipatory awareness. Family education completed via phone regarding need for supervision due to poor safety awareness, current cognitive deficits and strategies to utilize at home to to assist with STM deficits and maximize safety.    Disposition: Home  Diet: Heart healthy.   Special Instructions: 1. Needs 24 supervision with all activity. 2. No driving or strenuous activity till cleared by MD. 3. Follow up CT head per Dr. Wynetta Emery  to help decide on clearance to resume Brillinta.  . Discharge Instructions    Ambulatory referral to Physical Medicine Rehab   Complete by:  As directed    1-2 weeks follow up appointment     Allergies as of 07/07/2018      Reactions   Nicoderm [nicotine] Itching      Medication List    TAKE these medications   acetaminophen 325 MG tablet Commonly known as:  TYLENOL Take 1-2 tablets (325-650 mg total) by mouth every 4 (four) hours as needed for mild pain. Notes to patient:  For pain   aspirin 81 MG EC tablet Take 1 tablet (81 mg total) by mouth daily. Notes to patient:  Blood thinner   docusate sodium 100 MG capsule Commonly known as:  COLACE Take 1 capsule (100 mg total) by mouth 2 (two) times daily. Notes to patient:  Stool softner--for  constipation   levETIRAcetam 500 MG tablet Commonly known as:  Keppra Take 1 tablet (500 mg total) by mouth 2 (two) times daily.   pantoprazole 40 MG tablet Commonly known as:  PROTONIX Take 1 tablet (40 mg total) by mouth at bedtime. For reflux and indigestion What changed:  additional instructions      Follow-up Information    Ranelle Oyster, MD Follow up.   Specialty:  Physical Medicine and Rehabilitation Why:  Office will call you with follow up appointment Contact information: [address] [place] [address] [phone]        Lyn Records, MD Follow up.   Specialty:  Cardiology Contact information: [address] [place] [address] [phone]        Donalee Citrin, MD. Call.   Specialty:  Neurosurgery Why:  for follow up appointment Contact information: [address] [place] [address] [phone]        Practice, [facility] Follow up.   Contact information: [address] [place] [address] [phone]           Signed: Jacquelynn Cree 07/13/2018, 8:51 AM

## 2018-07-05 NOTE — Telephone Encounter (Signed)
Dr. Katrinka Blazing- did you speak to anyone about [name]'s antiplatelet therapy?  According to note today from Faith Rogue, MD, Craig [name] is on hold.  Any recommendations?

## 2018-07-06 ENCOUNTER — Inpatient Hospital Stay (HOSPITAL_COMMUNITY): Payer: Medicare Other | Admitting: Speech Pathology

## 2018-07-06 ENCOUNTER — Inpatient Hospital Stay (HOSPITAL_COMMUNITY): Payer: Medicare Other | Admitting: Physical Therapy

## 2018-07-06 ENCOUNTER — Inpatient Hospital Stay (HOSPITAL_COMMUNITY): Payer: Medicare Other

## 2018-07-06 NOTE — Progress Notes (Signed)
Physical Therapy Discharge Summary  Patient Details  Name: Craig [name] MRN: [id number] Date of Birth: 1934-05-21  Today's Date: 07/06/2018 PT Individual Time: 0938-1829 and 1303-1330 PT Individual Time Calculation (min): 70 min and 27 min   Patient has met 11 of 11 long term goals due to improved activity tolerance, improved balance, improved postural control, increased strength, increased range of motion, functional use of  right upper extremity and right lower extremity, improved attention, improved awareness and improved coordination.  Patient to discharge at an ambulatory level supervision without AD.   Therapist has spoken with pt's daughter Craig [name]) via telephone & educated her on pt's CLOF, memory deficits, impaired awareness, & how to assist pt upon d/c with her verbalizing understanding.  Reasons goals not met: n/a  Recommendation:  Patient will benefit from ongoing skilled PT services in home health setting to continue to advance safe functional mobility, address ongoing impairments in balance,  Memory, awareness, endurance, strengthening, R NMR, and minimize fall risk.  Equipment: No equipment provided  Reasons for discharge: treatment goals met and discharge from hospital  Patient/family agrees with progress made and goals achieved: Yes  Skilled PT Treatment: Treatment 1: Pt received in w/c & agreeable to tx. Pt with c/o "itching all over" with therapist offering lotion but pt declining. Pt retreive boots with supervision and donned them sitting EOB with mod I. Pt ambulates around unit without AD & supervision, negotiates ramp & uneven surface (mulch) with supervision, curb x 2 without UE support and supervision, completes car transfer at truck simulated height with supervision, and negotiates 24 steps (6" + 3") with B rails and supervision. Pt utilized [employer] Limits of Stability with static and moving BOS with BUE>1UE>no UE support with supervision, except min assist with  moving base, with task focusing on weight shifting, ankle reactions, and postural control. Provided pt with OTAGO Level B exercises, reviewed each exercise with pt performing each & therapist providing instructional cuing. Contacted pt's daughter Craig [name]) via telephone and educated her on pt's CLOF, impaired memory, HEP, and home modification recommendations (remove throw rugs & any tripping hazards) with her voicing understanding of all education. Pt completes bed mobility with independence. Provided pt with d/c handout regarding pt's recommendations. At end of session pt left sitting in w/c in room with chair alarm donned & needs at hand.  No c/o pain reported during session.  Treatment 2: Pt received in bed & agreeable to tx. No c/o pain reported. Pt ambulates around unit without AD & supervision & retrieves object from floor with supervision. Pt utilizes nu-step on level 6 x 10 minutes with all four extremities for global strengthening, RUE/LE NMR and endurance training. Pt engaged in dual task of walking while tossing ball with supervision except min assist for 1 LOB with task focusing on balance as well. At end of session pt left sitting on EOB with alarm set & needs at hand.  PT Discharge Precautions/Restrictions Precautions Precautions: Fall Precaution Comments: recent burr hole surgery Restrictions Weight Bearing Restrictions: No  Vision/Perception  Pt wears glasses for reading only at baseline. Pt denies changes in baseline vision.  Cognition Overall Cognitive Status: Impaired/Different from baseline Arousal/Alertness: Awake/alert Orientation Level: Oriented X4 Memory: Impaired Memory Impairment: Decreased recall of new information;Decreased short term memory Awareness: Impaired Awareness Impairment: Anticipatory impairment Problem Solving: Impaired Problem Solving Impairment: Functional complex Safety/Judgment: Impaired  Ranch[place] [employer] Scales of Cognitive Functioning:  Purposeful/appropriate  Sensation Sensation Light Touch: Appears Intact(denies numbness/tingling) Coordination Gross Motor Movements are  Fluid and Coordinated: Yes Fine Motor Movements are Fluid and Coordinated: Yes Heel Shin Test: both fairly equal   Motor  Motor Motor: Abnormal postural alignment and control Motor - Discharge Observations: R hemiparesis, generalized deconditioning    Mobility Bed Mobility Bed Mobility: Rolling Right;Rolling Left;Sit to Supine;Supine to Sit Rolling Right: Independent Rolling Left: Independent Supine to Sit: Independent Sit to Supine: Independent Transfers Transfers: Sit to Stand;Stand to Sit Sit to Stand: Independent Stand to Sit: Independent   Locomotion  Gait Ambulation: Yes Gait Assistance: Supervision/Verbal cueing Gait Distance (Feet): 150 Feet Assistive device: None Gait Gait: Yes Gait Pattern: (decreased stride length, decreased step length BLE, intermittent decreased weight shifting) Stairs / Additional Locomotion Stairs: Yes Stairs Assistance: Supervision/Verbal cueing Stair Management Technique: Two rails Number of Stairs: 24 Height of Stairs: (6" + 3") Ramp: Supervision/Verbal cueing(ambulatory without AD) Curb: Supervision/Verbal cueing(cuing to decrease speed of task for increased safety) Wheelchair Mobility Wheelchair Mobility: No   Trunk/Postural Assessment  Cervical Assessment Cervical Assessment: Exceptions to WFL(forward head) Thoracic Assessment Thoracic Assessment: Exceptions to WFL(rounded shoulders) Lumbar Assessment Lumbar Assessment: (posterior pelvic tilt) Postural Control Postural Control: Deficits on evaluation Righting Reactions: delayed Protective Responses: delayed   Balance 07/02/2018 Berg Balance Test = 38/56 07/04/2018 DGI = 20/24  Extremity Assessment  All extremities WFL.   Waunita Schooner 07/06/2018, 1:31 PM

## 2018-07-06 NOTE — Plan of Care (Signed)
Due to the current state of emergency, patients may not be receiving their 3-hours of Medicare-mandated therapy.   

## 2018-07-06 NOTE — Progress Notes (Signed)
[place] PHYSICAL MEDICINE & REHABILITATION PROGRESS NOTE   Subjective/Complaints:  In good spirits. Slept well. Anxious to get home. Denies pain. Remembered that dc day si tomorrow  ROS: Patient denies fever, rash, sore throat, blurred vision, nausea, vomiting, diarrhea, cough, shortness of breath or chest pain, joint or back pain, headache, or mood change.   Objective:   No results found. Recent Labs    07/04/18 0606  WBC 7.5  HGB 13.4  HCT 38.1*  PLT 206   Recent Labs    07/04/18 0606  NA 136  K 3.7  CL 99  CO2 22  GLUCOSE 138*  BUN 19  CREATININE 1.10  CALCIUM 9.2    Intake/Output Summary (Last 24 hours) at 07/06/2018 0909 Last data filed at 07/05/2018 1849 Gross per 24 hour  Intake 480 ml  Output -  Net 480 ml     Physical Exam: Vital Signs Blood pressure 135/68, pulse 86, temperature 98.9 F (37.2 C), temperature source Oral, resp. rate 19, weight 82.1 kg, SpO2 95 %. .Constitutional: No distress . Vital signs reviewed. HEENT: EOMI, oral membranes moist, scalp wounds cdi Neck: supple Cardiovascular: RRR without murmur. No JVD    Respiratory: CTA Bilaterally without wheezes or rales. Normal effort    GI: BS +, non-tender, non-distended  Extremities minimal pedal edema Skin without breakdown on heels, no rash  Neurological: able to tell me he was in the hospital when given cues. Knew the month. .  Motor strength is 4+ to 5/5 bilateral deltoid bicep tricep grip hip flexor knee extensor ankle dorsiflexor. Reasonable dexterity. Normal sensation. Stable exam Sensation intact light touch bilateral upper and lower limbs Psychiatric: engaged, pleasant    Assessment/Plan: 1. Functional deficits secondary to TBI/SDH which require 3+ hours per day of interdisciplinary therapy in a comprehensive inpatient rehab setting.  Physiatrist is providing close team supervision and 24 hour management of active medical problems listed below.  Physiatrist and rehab team  continue to assess barriers to discharge/monitor patient progress toward functional and medical goals  Care Tool:  Bathing    Body parts bathed by patient: Right arm, Left arm, Chest, Abdomen, Front perineal area, Buttocks, Right upper leg, Left upper leg, Right lower leg, Left lower leg, Face         Bathing assist Assist Level: Supervision/Verbal cueing     Upper Body Dressing/Undressing Upper body dressing   What is the patient wearing?: Pull over shirt    Upper body assist Assist Level: Supervision/Verbal cueing    Lower Body Dressing/Undressing Lower body dressing      What is the patient wearing?: Underwear/pull up, Pants     Lower body assist Assist for lower body dressing: Supervision/Verbal cueing     Toileting Toileting    Toileting assist Assist for toileting: Supervision/Verbal cueing     Transfers Chair/bed transfer  Transfers assist     Chair/bed transfer assist level: Supervision/Verbal cueing     Locomotion Ambulation   Ambulation assist      Assist level: Supervision/Verbal cueing Assistive device: (none) Max distance: 150 ft    Walk 10 feet activity   Assist     Assist level: Supervision/Verbal cueing Assistive device: Other (comment)(none)   Walk 50 feet activity   Assist    Assist level: Supervision/Verbal cueing Assistive device: Other (comment)(none)    Walk 150 feet activity   Assist    Assist level: Supervision/Verbal cueing Assistive device: Other (comment)(none)    Walk 10 feet on uneven surface  activity   Assist     Assist level: Supervision/Verbal cueing Assistive device: Other (comment)(none)   Wheelchair     Assist Will patient use wheelchair at discharge?: No Type of Wheelchair: Manual Wheelchair activity did not occur: N/A(pt to d/c at ambulatory level without AD)  Wheelchair assist level: Supervision/Verbal cueing Max wheelchair distance: 20 ft     Wheelchair 50 feet with 2  turns activity    Assist    Wheelchair 50 feet with 2 turns activity did not occur: N/A       Wheelchair 150 feet activity     Assist Wheelchair 150 feet activity did not occur: N/A        Medical Problem List and Plan: 1.   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 Antithrombotics: -DVT/anticoagulation: Mechanical: Sequential compression devices, below knee Bilateral lower extremities -antiplatelet therapy: ASA, NS prefers holding brilinta, need cardiology input to help determine risk/benefit of holding medication given his fall risk. 3. Pain Management:N/A 4. Mood:LCSW to follow for evaluation and support. -antipsychotic agents: N/A  - trazodone prn for sleep 5. Neuropsych: This patientis not fullycapable of making decisions on hisown behalf. 6. Skin/Wound Care: pt is 8 days out from surgery today---can remove scalp staples  7. Fluids/Electrolytes/Nutrition:good intake  -.labs wnl  8. ICM/CAD s/p STEMI/DES: On low dose ASA. Will continue to hold lipitor (was not taking at home?) 9. Intermittent hypoxia: -cxr without disease   --resolved 10. CKD?: Cr WNL 3/30 11. ABLA: hgb up to 13.4 3/30.  12. Prediabetes?: FBS 120-130 range.  -dc cbg checks, follow up as outpt 13. Seizure prophylaxis: Continue Keppra bid.  No seizure activity    LOS: 6 days A FACE TO FACE EVALUATION WAS PERFORMED  Ranelle Oyster 07/06/2018, 9:09 AM

## 2018-07-06 NOTE — Progress Notes (Signed)
Occupational Therapy Discharge Summary  Patient Details  Name: Craig [name] MRN: [id number] Date of Birth: 1935/03/03  Today's Date: 07/06/2018 OT Individual Time: 1330-1445 OT Individual Time Calculation (min): 75 min    Patient has met 10 of 10 long term goals due to improved activity tolerance, improved balance, postural control, ability to compensate for deficits, functional use of  LEFT upper extremity, improved attention, improved awareness and improved coordination.  Patient to discharge at overall Supervision level.  Patient's care partner is independent to provide the necessary cognitive assistance at discharge.  Family education has been completed via telephone. Pt's daughter has indicated understanding and support of supervision pt requires at d/c, as well as remaining cognitive deficits in judgement/safety awareness.   Reasons goals not met: All treatment goals met  Recommendation:  Patient will benefit from ongoing skilled OT services in home health setting to continue to advance functional skills in the area of BADL.  Equipment: No equipment provided  Reasons for discharge: treatment goals met and discharge from hospital  Patient/family agrees with progress made and goals achieved: Yes   Skilled OT Intervention: Session focused on d/c planning, family education, and establishment of HEP. Pt completed ADLs at sink with (S)- mod I. Pt demonstrated improved safety awareness this session, explaining to OT the activities he can not complete at home, I.e. mowing lawn. Pt completed 200 ft of functional mobility with (S) with no UE support. Pt was provided with HEP and performed teachback on all exercises. Pt's daughter was called with pt present and education provided re pt current condition, HEP, return to normalcy, and brain injury awareness. Pt was left supine with NT present.   OT Discharge Precautions/Restrictions  Precautions Precautions: Fall Precaution Comments:  recent burr hole surgery Restrictions Weight Bearing Restrictions: No Pain Pain Assessment Pain Scale: 0-10 Pain Score: 0-No pain ADL ADL Eating: Independent Grooming: Modified independent Where Assessed-Grooming: Standing at sink Upper Body Bathing: Modified independent Where Assessed-Upper Body Bathing: Standing at sink Lower Body Bathing: Supervision/safety Where Assessed-Lower Body Bathing: Standing at sink Upper Body Dressing: Modified independent (Device) Where Assessed-Upper Body Dressing: Standing at sink Lower Body Dressing: Supervision/safety Where Assessed-Lower Body Dressing: Sitting at sink Toileting: Supervision/safety Where Assessed-Toileting: Glass blower/designer: Distant supervision Armed forces technical officer Method: Counselling psychologist: Energy manager Method: Unable to English as a second language teacher: Close supervision Social research officer, government Method: Heritage manager: Grab bars Vision Baseline Vision/History: Wears glasses Wears Glasses: Reading only Patient Visual Report: No change from baseline Vision Assessment?: No apparent visual deficits Perception  Perception: Within Functional Limits Praxis Praxis: Intact Cognition Overall Cognitive Status: Impaired/Different from baseline Arousal/Alertness: Awake/alert Orientation Level: Oriented X4 Attention: Selective Sustained Attention: Appears intact Selective Attention: Impaired Selective Attention Impairment: Functional complex Memory: Impaired Memory Impairment: Decreased recall of new information;Decreased short term memory Decreased Short Term Memory: Functional complex Awareness Impairment: Anticipatory impairment Problem Solving: Impaired Problem Solving Impairment: Functional complex Safety/Judgment: Impaired Rancho [employer] Scales of Cognitive Functioning: Purposeful/appropriate Sensation Sensation Light Touch: Appears Intact Hot/Cold: Appears  Intact Proprioception: Appears Intact Stereognosis: Appears Intact Coordination Gross Motor Movements are Fluid and Coordinated: Yes Fine Motor Movements are Fluid and Coordinated: Yes Motor  Motor Motor: Abnormal postural alignment and control Motor - Discharge Observations: generalized deconditioning Mobility  Bed Mobility Bed Mobility: Rolling Right;Rolling Left;Sit to Supine;Supine to Sit Rolling Right: Independent Rolling Left: Independent Supine to Sit: Independent Sit to Supine: Independent Transfers Sit to Stand: Independent Stand to Sit: Independent  Trunk/Postural Assessment  Cervical Assessment Cervical Assessment: Exceptions to WFL(forward head) Thoracic Assessment Thoracic Assessment: Exceptions to WFL(rounded shoulders) Lumbar Assessment Lumbar Assessment: Within Functional Limits Postural Control Postural Control: Deficits on evaluation Protective Responses: delayed  Balance Balance Balance Assessed: Yes Static Sitting Balance Static Sitting - Balance Support: Feet supported Static Sitting - Level of Assistance: 7: Independent Dynamic Sitting Balance Dynamic Sitting - Balance Support: No upper extremity supported;Feet supported Dynamic Sitting - Level of Assistance: 6: Modified independent (Device/Increase time) Dynamic Sitting - Balance Activities: Reaching for objects Static Standing Balance Static Standing - Balance Support: During functional activity;No upper extremity supported Static Standing - Level of Assistance: 6: Modified independent (Device/Increase time) Dynamic Standing Balance Dynamic Standing - Balance Support: During functional activity;No upper extremity supported Dynamic Standing - Level of Assistance: 5: Stand by assistance Dynamic Standing - Balance Activities: Reaching across midline Extremity/Trunk Assessment RUE Assessment RUE Assessment: Within Functional Limits LUE Assessment LUE Assessment: Within Functional  Limits   Curtis Sites 07/06/2018, 5:14 PM

## 2018-07-06 NOTE — Progress Notes (Signed)
Speech Language Pathology Discharge Summary  Patient Details  Name: TRUST LEH MRN: [id number] Date of Birth: 1934-12-27  Today's Date: 07/06/2018 SLP Individual Time: 0915-1000 SLP Individual Time Calculation (min): 45 min   Skilled Therapeutic Interventions:  Skilled treatment session focused on cognitive goals and completion of patient and family education with the patient's daughter via the telephone. SLP facilitated session by providing education to the patient's daughter in regards to patient's current cognitive deficits and strategies to utilize at home to maximize safety and short-term memory while at home. She verbalized understanding of all information. SLP also facilitated session by generating a list of activities that patient should or should not do at home to maximize safety, patient completed list with supervision verbal cues. Handouts provided to reinforce all information. Patient left upright in wheelchair with alarm on and all needs within reach. Continue with current plan of care.   Patient has met 4 of 4 long term goals.  Patient to discharge at overall Supervision;Min level.   Reasons goals not met: N/A    Clinical Impression/Discharge Summary: Patient has made excellent gains and has met 4 of 4 LTGs this admission. Currently, patient demonstrates behaviors consistent with a Rancho Level VIII and requires overall supervision-Min A verbal cues for complex problem solving, recall of functional information, attention and anticipatory awareness. Patient and family education is complete and patient will discharge home with 24 hour supervision from family. Patient would benefit from f/u SLP services to maximize his cognitive functioning and overall functional independence in order to reduce caregiver burden.   Care Partner:  Caregiver Able to Provide Assistance: Yes  Type of Caregiver Assistance: Cognitive  Recommendation:  24 hour supervision/assistance;Home Health SLP   Rationale for SLP Follow Up: Maximize cognitive function and independence;Reduce caregiver burden   Equipment: N/A   Reasons for discharge: Treatment goals met;Discharged from hospital   Patient/Family Agrees with Progress Made and Goals Achieved: Yes    Cesar Chavez, [place] 07/06/2018, 12:29 PM

## 2018-07-06 NOTE — Telephone Encounter (Signed)
Hello Craig [name],  When will we be able to resume Plavix on [name]?

## 2018-07-07 DIAGNOSIS — N183 Chronic kidney disease, stage 3 (moderate): Secondary | ICD-10-CM

## 2018-07-07 MED ORDER — PANTOPRAZOLE SODIUM 40 MG PO TBEC: 40.0000 mg | DELAYED_RELEASE_TABLET | Freq: Every day | ORAL | 1 refills | Status: DC

## 2018-07-07 MED ORDER — ACETAMINOPHEN 325 MG PO TABS: 325.0000 mg | ORAL_TABLET | ORAL | Status: DC | PRN

## 2018-07-07 MED ORDER — LEVETIRACETAM 500 MG PO TABS: 500.0000 mg | ORAL_TABLET | Freq: Two times a day (BID) | ORAL | 1 refills | Status: DC

## 2018-07-07 NOTE — Progress Notes (Signed)
Social Work  Discharge Note  The overall goal for the admission was met for:   Discharge location: Yes - home with wife and family able to provide 24/7 supervision  Length of Stay: Yes - 7 days  Discharge activity level: Yes - supervision  Home/community participation: Yes  Services provided included: MD, RD, PT, OT, SLP, RN, TR, Pharmacy and [place]: [facility]  Follow-up services arranged: Home Health: PT, OT, ST via [place], DME: NA and Patient/Family has no preference for HH/DME agencies  Comments (or additional information):    Contact info:  Daughter, Dellis Anes @ [phone]  (best contact)  Patient/Family verbalized understanding of follow-up arrangements: Yes  Individual responsible for coordination of the follow-up plan: pt/ spouse  Confirmed correct DME delivered: NA - had recommended DME    Latorya Bautch

## 2018-07-07 NOTE — Discharge Instructions (Signed)
Inpatient Rehab Discharge Instructions  Craig [name] Discharge date and time: 07/05/18   Activities/Precautions/ Functional Status: Activity: no lifting, driving, or strenuous exercise till cleared by MD Diet: cardiac diet Wound Care: keep wound clean and dry    Functional status:   ___ No restrictions     ___ Walk up steps independently _X__ 24/7 supervision/assistance   ___ Walk up steps with assistance ___ Intermittent supervision/assistance  ___ Bathe/dress independently ___ Walk with walker     ___ Bathe/dress with assistance ___ Walk Independently    ___ Shower independently ___ Walk with assistance    __X_ Shower with assistance _X__ No alcohol     ___ Return to work/school ________    COMMUNITY REFERRALS UPON DISCHARGE:    Home Health:   PT     OT     ST                     Agency:  [facility] Phone: [phone]    Special Instructions:    My questions have been answered and I understand these instructions. I will adhere to these goals and the provided educational materials after my discharge from the hospital.  Patient/Caregiver Signature _______________________________ Date __________  Clinician Signature _______________________________________ Date __________  Please bring this form and your medication list with you to all your follow-up doctor's appointments.

## 2018-07-07 NOTE — Progress Notes (Signed)
[place] PHYSICAL MEDICINE & REHABILITATION PROGRESS NOTE   Subjective/Complaints:  Pt had questions about activities once home. Asked if he could go the mountains  ROS: Patient denies fever, rash, sore throat, blurred vision, nausea, vomiting, diarrhea, cough, shortness of breath or chest pain, joint or back pain, headache, or mood change.    Objective:   No results found. No results for input(s): WBC, HGB, HCT, PLT in the last 72 hours. No results for input(s): NA, K, CL, CO2, GLUCOSE, BUN, CREATININE, CALCIUM in the last 72 hours.  Intake/Output Summary (Last 24 hours) at 07/07/2018 0955 Last data filed at 07/06/2018 1800 Gross per 24 hour  Intake 600 ml  Output -  Net 600 ml     Physical Exam: Vital Signs Blood pressure 128/71, pulse 69, temperature 98.8 F (37.1 C), temperature source Oral, resp. rate 19, weight 82.1 kg, SpO2 97 %. .Constitutional: No distress . Vital signs reviewed. HEENT: EOMI, oral membranes moist Neck: supple Cardiovascular: RRR without murmur. No JVD    Respiratory: CTA Bilaterally without wheezes or rales. Normal effort    GI: BS +, non-tender, non-distended  Extremities minimal pedal edema Skin intact Neurological: improved insight and awareness. Asked appropriate questions but had problems with detailed information.  Motor strength is 4+ to 5/5 bilateral deltoid bicep tricep grip hip flexor knee extensor ankle dorsiflexor. Reasonable dexterity. Normal sensation. Stable exam Sensation intact light touch bilateral upper and lower limbs Psychiatric: pleasant    Assessment/Plan: 1. Functional deficits secondary to TBI/SDH which require 3+ hours per day of interdisciplinary therapy in a comprehensive inpatient rehab setting.  Physiatrist is providing close team supervision and 24 hour management of active medical problems listed below.  Physiatrist and rehab team continue to assess barriers to discharge/monitor patient progress toward functional  and medical goals  Care Tool:  Bathing    Body parts bathed by patient: Right arm, Left arm, Chest, Abdomen, Front perineal area, Buttocks, Right upper leg, Left upper leg, Right lower leg, Left lower leg, Face         Bathing assist Assist Level: Supervision/Verbal cueing     Upper Body Dressing/Undressing Upper body dressing   What is the patient wearing?: Pull over shirt    Upper body assist Assist Level: Independent    Lower Body Dressing/Undressing Lower body dressing      What is the patient wearing?: Underwear/pull up, Pants     Lower body assist Assist for lower body dressing: Supervision/Verbal cueing     Toileting Toileting    Toileting assist Assist for toileting: Supervision/Verbal cueing     Transfers Chair/bed transfer  Transfers assist     Chair/bed transfer assist level: Supervision/Verbal cueing     Locomotion Ambulation   Ambulation assist      Assist level: Supervision/Verbal cueing Assistive device: (none) Max distance: 150 ft    Walk 10 feet activity   Assist     Assist level: Supervision/Verbal cueing Assistive device: Other (comment)(none)   Walk 50 feet activity   Assist    Assist level: Supervision/Verbal cueing Assistive device: Other (comment)(none)    Walk 150 feet activity   Assist    Assist level: Supervision/Verbal cueing Assistive device: Other (comment)(none)    Walk 10 feet on uneven surface  activity   Assist     Assist level: Supervision/Verbal cueing Assistive device: Other (comment)(none)   Wheelchair     Assist Will patient use wheelchair at discharge?: No Type of Wheelchair: Manual Wheelchair activity did not occur:  N/A(pt to d/c at ambulatory level without AD)  Wheelchair assist level: Supervision/Verbal cueing Max wheelchair distance: 20 ft     Wheelchair 50 feet with 2 turns activity    Assist    Wheelchair 50 feet with 2 turns activity did not occur: N/A        Wheelchair 150 feet activity     Assist Wheelchair 150 feet activity did not occur: N/A        Medical Problem List and Plan: 1.   Traumatic brain injury secondary to fall with subdural hematoma  -Dc home today.  -reviewed safety. He shouldn't be using ANY guns or walking on rocks/uneven surfaces. Can [place] to [place] if he wants to and can get there.  -Patient to see Rehab MD/provider in the office for transitional care encounter in 2-4 weeks.   2. Antithrombotics: -DVT/anticoagulation: Mechanical: Sequential compression devices, below knee Bilateral lower extremities -antiplatelet therapy: ASA, Holding brilinta until outpt follow up with NS. Repeat CT to be done at that time. 3. Pain Management:N/A 4. Mood:LCSW to follow for evaluation and support. -antipsychotic agents: N/A  - trazodone prn for sleep 5. Neuropsych: This patientis not fullycapable of making decisions on hisown behalf. 6. Skin/Wound Care: pt is 8 days out from surgery today---can remove scalp staples  7. Fluids/Electrolytes/Nutrition:good intake 8. ICM/CAD s/p STEMI/DES: On low dose ASA. Will continue to hold lipitor (was not taking at home?), holding brilinta too 9. Intermittent hypoxia: -cxr without disease   --resolved 10. CKD?: Cr WNL 3/30 11. ABLA: hgb up to 13.4 3/30.  12. Prediabetes?: FBS 120-130 range.  -dc cbg checks, follow up as outpt 13. Seizure prophylaxis: Continue Keppra bid.  No seizure activity    LOS: 7 days A FACE TO FACE EVALUATION WAS PERFORMED  Ranelle Oyster 07/07/2018, 9:55 AM

## 2018-07-07 NOTE — Progress Notes (Signed)
Patient discharged to home per wheelchair accompanied by LPN and Lucy SW.

## 2018-07-07 NOTE — Progress Notes (Signed)
Patient to be discharged today; PA done discharge instructions. No further questions noted.

## 2018-07-08 DIAGNOSIS — Z7982 Long term (current) use of aspirin: Secondary | ICD-10-CM | POA: Diagnosis not present

## 2018-07-08 DIAGNOSIS — I255 Ischemic cardiomyopathy: Secondary | ICD-10-CM | POA: Diagnosis not present

## 2018-07-08 DIAGNOSIS — N182 Chronic kidney disease, stage 2 (mild): Secondary | ICD-10-CM | POA: Diagnosis not present

## 2018-07-08 DIAGNOSIS — S065X9S Traumatic subdural hemorrhage with loss of consciousness of unspecified duration, sequela: Secondary | ICD-10-CM | POA: Diagnosis not present

## 2018-07-08 DIAGNOSIS — Z87891 Personal history of nicotine dependence: Secondary | ICD-10-CM | POA: Diagnosis not present

## 2018-07-08 DIAGNOSIS — E785 Hyperlipidemia, unspecified: Secondary | ICD-10-CM | POA: Diagnosis not present

## 2018-07-08 DIAGNOSIS — Z9181 History of falling: Secondary | ICD-10-CM | POA: Diagnosis not present

## 2018-07-08 DIAGNOSIS — I2510 Atherosclerotic heart disease of native coronary artery without angina pectoris: Secondary | ICD-10-CM | POA: Diagnosis not present

## 2018-07-08 DIAGNOSIS — I129 Hypertensive chronic kidney disease with stage 1 through stage 4 chronic kidney disease, or unspecified chronic kidney disease: Secondary | ICD-10-CM | POA: Diagnosis not present

## 2018-07-08 DIAGNOSIS — W19XXXS Unspecified fall, sequela: Secondary | ICD-10-CM | POA: Diagnosis not present

## 2018-07-11 DIAGNOSIS — I2510 Atherosclerotic heart disease of native coronary artery without angina pectoris: Secondary | ICD-10-CM | POA: Diagnosis not present

## 2018-07-11 DIAGNOSIS — L299 Pruritus, unspecified: Secondary | ICD-10-CM | POA: Diagnosis not present

## 2018-07-11 DIAGNOSIS — S065X9A Traumatic subdural hemorrhage with loss of consciousness of unspecified duration, initial encounter: Secondary | ICD-10-CM | POA: Diagnosis not present

## 2018-07-11 DIAGNOSIS — I213 ST elevation (STEMI) myocardial infarction of unspecified site: Secondary | ICD-10-CM | POA: Diagnosis not present

## 2018-07-12 DIAGNOSIS — E785 Hyperlipidemia, unspecified: Secondary | ICD-10-CM | POA: Diagnosis not present

## 2018-07-12 DIAGNOSIS — Z87891 Personal history of nicotine dependence: Secondary | ICD-10-CM | POA: Diagnosis not present

## 2018-07-12 DIAGNOSIS — I2510 Atherosclerotic heart disease of native coronary artery without angina pectoris: Secondary | ICD-10-CM | POA: Diagnosis not present

## 2018-07-12 DIAGNOSIS — N182 Chronic kidney disease, stage 2 (mild): Secondary | ICD-10-CM | POA: Diagnosis not present

## 2018-07-12 DIAGNOSIS — I129 Hypertensive chronic kidney disease with stage 1 through stage 4 chronic kidney disease, or unspecified chronic kidney disease: Secondary | ICD-10-CM | POA: Diagnosis not present

## 2018-07-12 DIAGNOSIS — S065X9S Traumatic subdural hemorrhage with loss of consciousness of unspecified duration, sequela: Secondary | ICD-10-CM | POA: Diagnosis not present

## 2018-07-12 DIAGNOSIS — Z7982 Long term (current) use of aspirin: Secondary | ICD-10-CM | POA: Diagnosis not present

## 2018-07-12 DIAGNOSIS — W19XXXS Unspecified fall, sequela: Secondary | ICD-10-CM | POA: Diagnosis not present

## 2018-07-12 DIAGNOSIS — Z9181 History of falling: Secondary | ICD-10-CM | POA: Diagnosis not present

## 2018-07-12 DIAGNOSIS — I255 Ischemic cardiomyopathy: Secondary | ICD-10-CM | POA: Diagnosis not present

## 2018-07-13 ENCOUNTER — Encounter: Payer: Self-pay | Admitting: Registered Nurse

## 2018-07-13 ENCOUNTER — Telehealth: Payer: Self-pay | Admitting: Registered Nurse

## 2018-07-13 ENCOUNTER — Telehealth: Payer: Self-pay | Admitting: Interventional Cardiology

## 2018-07-13 NOTE — Telephone Encounter (Signed)
New Message    Pts daughter is calling back And she wants to only do a Telephone visit and not a virtual visit

## 2018-07-13 NOTE — Telephone Encounter (Signed)
Transitional Care call Transitional Questions Answered by Mrs. Farkas Appointments scheduled with daughter Ms. Doroteo Glassman  Patient name: Craig [name]  DOB: [birth year] 1. Are you/is patient experiencing any problems since coming home? No a. Are there any questions regarding any aspect of care? No 2. Are there any questions regarding medications administration/dosing? No a. Are meds being taken as prescribed? Yes b. "Patient should review meds with caller to confirm" Medication List Reviewed 3. Have there been any falls? No 4. Has Home Health been to the house and/or have they contacted you? Yes, [facility] a. If not, have you tried to contact them? NA b. Can we help you contact them? NA 5. Are bowels and bladder emptying properly? Yes a. Are there any unexpected incontinence issues? No b. If applicable, is patient following bowel/bladder programs? NA 6. Any fevers, problems with breathing, unexpected pain? No 7. Are there any skin problems or new areas of breakdown? No 8. Has the patient/family member arranged specialty MD follow up (ie cardiology/neurology/renal/surgical/etc.)?  Yes, HFU appointments are scheduled.  a. Can we help arrange? NA 9. Does the patient need any other services or support that we can help arrange? NA 10. Are caregivers following through as expected in assisting the patient? Yes 11. Has the patient quit smoking, drinking alcohol, or using drugs as recommended? Mrs. Gronlund states Mr. Monrroy doesn't smoke, drink alcohol or use illicit drugs.   Appointment date/time 08/31/2018  arrival time 10:20 for 10:40 appointment with Dr. Riley Kill. At [address] [facility] [address]

## 2018-07-14 DIAGNOSIS — Z9181 History of falling: Secondary | ICD-10-CM | POA: Diagnosis not present

## 2018-07-14 DIAGNOSIS — W19XXXS Unspecified fall, sequela: Secondary | ICD-10-CM | POA: Diagnosis not present

## 2018-07-14 DIAGNOSIS — S065X9S Traumatic subdural hemorrhage with loss of consciousness of unspecified duration, sequela: Secondary | ICD-10-CM | POA: Diagnosis not present

## 2018-07-14 DIAGNOSIS — N182 Chronic kidney disease, stage 2 (mild): Secondary | ICD-10-CM | POA: Diagnosis not present

## 2018-07-14 DIAGNOSIS — I129 Hypertensive chronic kidney disease with stage 1 through stage 4 chronic kidney disease, or unspecified chronic kidney disease: Secondary | ICD-10-CM | POA: Diagnosis not present

## 2018-07-14 DIAGNOSIS — E785 Hyperlipidemia, unspecified: Secondary | ICD-10-CM | POA: Diagnosis not present

## 2018-07-14 DIAGNOSIS — Z87891 Personal history of nicotine dependence: Secondary | ICD-10-CM | POA: Diagnosis not present

## 2018-07-14 DIAGNOSIS — I255 Ischemic cardiomyopathy: Secondary | ICD-10-CM | POA: Diagnosis not present

## 2018-07-14 DIAGNOSIS — Z7982 Long term (current) use of aspirin: Secondary | ICD-10-CM | POA: Diagnosis not present

## 2018-07-14 DIAGNOSIS — I2510 Atherosclerotic heart disease of native coronary artery without angina pectoris: Secondary | ICD-10-CM | POA: Diagnosis not present

## 2018-07-14 NOTE — Telephone Encounter (Signed)
I called pt back. His Wife ask me to call his Daughter Victorino Dike. I called her but it went to VM. If she calls back please send me a message through secure chat and I will call her right back. I don't have an extension due to working from home.

## 2018-07-17 NOTE — Progress Notes (Signed)
Virtual Visit via Video Note   This visit type was conducted due to national recommendations for restrictions regarding the COVID-19 Pandemic (e.g. social distancing) in an effort to limit this patient's exposure and mitigate transmission in our community.  Due to his co-morbid illnesses, this patient is at least at moderate risk for complications without adequate follow up.  This format is felt to be most appropriate for this patient at this time.  All issues noted in this document were discussed and addressed.  A limited physical exam was performed with this format.  Please refer to the patient's chart for his consent to telehealth for [facility].   Evaluation Performed:  Follow-up visit  Date:  07/18/2018   ID:  Craig ParaCharles F Thomann, DOB [birth year], MRN [id number]  Patient Location: Home  Provider Location: Office  PCP:  Practice[facility]  Cardiologist:  Lesleigh NoeHenry W Tranell Wojtkiewicz III, MD  Electrophysiologist:  None   Chief Complaint:  MI, Cfx Stent, Residual RCA disease  History of Present Illness:    Craig [name] is a 84 y.o. male who presents via audio/video conferencing for a telehealth visit today.    He has a hx of STEMI s/p PCI/DESx1 diag, residual disease in RCA, HTN, HL, RBBB, CKDwho is being seen today for the evaluation of subdural hematoma on DAPTat the request of Dr. Wynetta Emeryram. Now on aspirin without P2Y12 blocker and has residual disease that needs percutaneous therapy.  He has significantly improved since I saw him in the hospital after he had bur drainage of bilateral subdural hematoma.  His balance has significantly improved as has overall mobility.  With PT and OT, increased activity, and independent living, he has not had chest discomfort, dyspnea, palpitations, or what he would perceive as limitations due to his heart.  Both his wife and daughter joined on the phone conversation.  They are reluctant to resume statin therapy.  We discussed the way forward  relative to residual coronary disease.  A shared decision to treat medically was decided upon.  In order to effectively do this, he will need suppression of lipids.  LDL on admission was 91.  I have recommended using very low dose rosuvastatin and repeating a lipid panel in about 6 or 8 weeks.  They will discussed with Dr. Wynetta Emerycram to determine if he/when he can resume a P2 Y 12, which in his case will likely be clopidogrel as a single agent and dropping aspirin altogether.  The patient does not have symptoms concerning for COVID-19 infection (fever, chills, cough, or new shortness of breath).    Past Medical History:  Diagnosis Date  . CAD (coronary artery disease)    a.  inferior STEMI s/p DES to D1 with residual disease - planned PCI to RCA, EF 45-50%.  . CKD (chronic kidney disease), stage II   . Hyperlipidemia, mild   . Hypertension   . Ischemic cardiomyopathy   . Kidney stones   . Pre-diabetes   . RBBB   . Thrombocytopenia (HCC)   . Tobacco abuse    Past Surgical History:  Procedure Laterality Date  . BURR HOLE N/A 06/27/2018   Procedure: Ezekiel InaBURR HOLES;  Surgeon: Donalee Citrinram, Gary, MD;  [facility];  Service: Neurosurgery;  Laterality: N/A;  . CORONARY/GRAFT ACUTE MI REVASCULARIZATION N/A 05/30/2018   Procedure: CORONARY/GRAFT ACUTE MI REVASCULARIZATION;  Surgeon: Lyn RecordsSmith, Dom Haverland W, MD;  [facility];  Service: Cardiovascular;  Laterality: N/A;  . LEFT HEART CATH AND CORONARY ANGIOGRAPHY N/A  05/30/2018   Procedure: LEFT HEART CATH AND CORONARY ANGIOGRAPHY;  Surgeon: Lyn Records, MD;  Location: [facility] INVASIVE CV LAB;  Service: Cardiovascular;  Laterality: N/A;     Current Meds  Medication Sig  . acetaminophen (TYLENOL) 325 MG tablet Take 1-2 tablets (325-650 mg total) by mouth every 4 (four) hours as needed for mild pain.  Marland Kitchen aspirin EC 81 MG EC tablet Take 1 tablet (81 mg total) by mouth daily.  Marland Kitchen docusate sodium (COLACE) 100 MG capsule Take 100 mg by mouth daily.  Marland Kitchen  levETIRAcetam (KEPPRA) 500 MG tablet Take 500 mg by mouth 2 (two) times daily.  Marland Kitchen loratadine (CLARITIN) 10 MG tablet Take 10 mg by mouth daily.     Allergies:   Nicoderm [nicotine]   Social History   Tobacco Use  . Smoking status: Former Smoker    Last attempt to quit: 1960    Years since quitting: 60.3  . Smokeless tobacco: Current User    Types: Chew  Substance Use Topics  . Alcohol use: Never    Frequency: Never  . Drug use: Never     Family Hx: The patient's family history includes Hyperlipidemia in his father; Hypertension in his father.  ROS:   Please see the history of present illness.    Improve mobility.  Stiffness in his legs.  No chest discomfort.  Denies syncope.  No orthopnea or PND. All other systems reviewed and are negative.   Prior CV studies:   The following studies were reviewed today:  No new imaging or functional data.  Labs/Other Tests and Data Reviewed:    EKG:  An ECG dated June 24, 2018 was personally reviewed today and demonstrated:  Sinus tachycardia at 105 bpm, right bundle branch block, and nonspecific ST abnormality.  Recent Labs: 05/30/2018: B Natriuretic Peptide 40.6; TSH 1.558 06/29/2018: Magnesium 2.0 07/01/2018: ALT 17 07/04/2018: BUN 19; Creatinine, Ser 1.10; Hemoglobin 13.4; Platelets 206; Potassium 3.7; Sodium 136   Recent Lipid Panel Lab Results  Component Value Date/Time   CHOL 152 05/30/2018 03:51 PM   TRIG 151 (H) 05/30/2018 03:51 PM   HDL 31 (L) 05/30/2018 03:51 PM   CHOLHDL 4.9 05/30/2018 03:51 PM   LDLCALC 91 05/30/2018 03:51 PM    Wt Readings from Last 3 Encounters:  07/18/18 162 lb (73.5 kg)  07/06/18 181 lb 0.7 oz (82.1 kg)  06/24/18 179 lb 14.3 oz (81.6 kg)     Objective:    Vital Signs:  BP 124/60   Ht 5\' 10"  (1.778 m)   Wt 162 lb (73.5 kg)   BMI 23.24 kg/m    Physical exam and appearance cannot be assessed because this was a phone conversation only.  ASSESSMENT & PLAN:    1. CAD in native artery    2. Ischemic cardiomyopathy   3. Essential hypertension   4. Hyperlipidemia, unspecified hyperlipidemia type   5. Bilateral subdural hematomas (HCC)    Plan in order of problem: 1. Have decided to treat residual coronary disease with medical therapy.  We had a significant discussion concerning secondary risk prevention.  We will start low intensity statin therapy since he failed atorvastatin 80 mg due to muscle aches.  Begin rosuvastatin 5 mg/day.  Baseline LDL at the time of MI was 91.  We discussed physical activity, blood pressure control, and other factors as noted below.  No intention to perform right coronary intervention unless he becomes symptomatic. 2. No evidence of volume overload or heart failure. 3. Target  BP is 130/80 mmHg. 4. Rosuvastatin as noted above. 5. We will determine if we can resume a P2 Y 12 inhibitor, at the discretion of Dr. Donalee Citrin.  My goal would be to use clopidogrel monotherapy 75 mg/day and drop aspirin.  No intervention at this time to perform right coronary intervention unless he becomes symptomatic.  Overall education and awareness concerning primary/secondary risk prevention was discussed in detail: LDL less than 70, hemoglobin A1c less than 7, blood pressure target less than 130/80 mmHg, >150 minutes of moderate aerobic activity per week, avoidance of smoking, weight control (via diet and exercise), and continued surveillance/management of/for obstructive sleep apnea.   COVID-19 Education: The signs and symptoms of COVID-19 were discussed with the patient and how to seek care for testing (follow up with PCP or arrange E-visit).  The importance of social distancing was discussed today.  Time:   Today, I have spent 15 minutes with the patient with telehealth technology discussing the above problems.     Medication Adjustments/Labs and Tests Ordered: Current medicines are reviewed at length with the patient today.  Concerns regarding medicines are outlined  above.  Tests Ordered: No orders of the defined types were placed in this encounter.  Medication Changes: No orders of the defined types were placed in this encounter.   Disposition:  Follow up in 3 month(s)  Signed, Lesleigh Noe, MD  07/18/2018 12:52 PM    [facility]

## 2018-07-18 ENCOUNTER — Telehealth (INDEPENDENT_AMBULATORY_CARE_PROVIDER_SITE_OTHER): Payer: Medicare Other | Admitting: Interventional Cardiology

## 2018-07-18 ENCOUNTER — Other Ambulatory Visit: Payer: Self-pay

## 2018-07-18 ENCOUNTER — Encounter: Payer: Self-pay | Admitting: Interventional Cardiology

## 2018-07-18 ENCOUNTER — Encounter: Payer: Self-pay | Admitting: *Deleted

## 2018-07-18 VITALS — BP 124/60 | Ht 70.0 in | Wt 162.0 lb

## 2018-07-18 DIAGNOSIS — I2510 Atherosclerotic heart disease of native coronary artery without angina pectoris: Secondary | ICD-10-CM | POA: Diagnosis not present

## 2018-07-18 DIAGNOSIS — S065X9A Traumatic subdural hemorrhage with loss of consciousness of unspecified duration, initial encounter: Secondary | ICD-10-CM

## 2018-07-18 DIAGNOSIS — I10 Essential (primary) hypertension: Secondary | ICD-10-CM

## 2018-07-18 DIAGNOSIS — Z7189 Other specified counseling: Secondary | ICD-10-CM

## 2018-07-18 DIAGNOSIS — S065XAA Traumatic subdural hemorrhage with loss of consciousness status unknown, initial encounter: Secondary | ICD-10-CM

## 2018-07-18 DIAGNOSIS — E785 Hyperlipidemia, unspecified: Secondary | ICD-10-CM

## 2018-07-18 DIAGNOSIS — I255 Ischemic cardiomyopathy: Secondary | ICD-10-CM

## 2018-07-18 MED ORDER — ROSUVASTATIN CALCIUM 5 MG PO TABS: 5.0000 mg | ORAL_TABLET | Freq: Every day | ORAL | 1 refills | Status: DC

## 2018-07-18 NOTE — Patient Instructions (Addendum)
Medication Instructions:   PLEASE START TAKING ROSUVASTATIN 5 MG BY MOUTH DAILY   If you need a refill on your cardiac medications before your next appointment, please call your pharmacy.    Lab work:  IN 4 TO 6 WEEKS AT OUR OFFICE TO CHECK--CMET AND LIPIDS--PLEASE COME FASTING TO THIS LAB APPOINTMENT- YOUR LAB APPOINTMENT IS SCHEDULED FOR Monday 08/15/2018   If you have labs (blood work) drawn today and your tests are completely normal, you will receive your results only by: Marland Kitchen MyChart Message (if you have MyChart) OR . A paper copy in the mail If you have any lab test that is abnormal or we need to change your treatment, we will call you to review the results.    Follow-Up:  WITH DR Katrinka Blazing IN THE OFFICE, IN PERSON ON Monday 10/31/18 AT 11:40 AM    Any Other Special Instructions Will Be Listed Below (If Applicable).  DR Katrinka Blazing WANTED TO REMIND YOU TO ASK DR. CRAM TOMORROW 07/19/18  IF ITS OK FOR YOU TO USE PLAVIX (CLOPIDEGRIL)?  YOU CAN CALL us BACK AT OFFICE NUMBER AT  [phone], WITH DR. CRAM'S DECISION IF OK TO USE THIS MEDICINE.

## 2018-07-20 ENCOUNTER — Other Ambulatory Visit: Payer: Self-pay | Admitting: Neurosurgery

## 2018-07-20 DIAGNOSIS — Z87891 Personal history of nicotine dependence: Secondary | ICD-10-CM | POA: Diagnosis not present

## 2018-07-20 DIAGNOSIS — N182 Chronic kidney disease, stage 2 (mild): Secondary | ICD-10-CM | POA: Diagnosis not present

## 2018-07-20 DIAGNOSIS — S065XAA Traumatic subdural hemorrhage with loss of consciousness status unknown, initial encounter: Secondary | ICD-10-CM

## 2018-07-20 DIAGNOSIS — Z7982 Long term (current) use of aspirin: Secondary | ICD-10-CM | POA: Diagnosis not present

## 2018-07-20 DIAGNOSIS — I2510 Atherosclerotic heart disease of native coronary artery without angina pectoris: Secondary | ICD-10-CM | POA: Diagnosis not present

## 2018-07-20 DIAGNOSIS — E785 Hyperlipidemia, unspecified: Secondary | ICD-10-CM | POA: Diagnosis not present

## 2018-07-20 DIAGNOSIS — S065X9A Traumatic subdural hemorrhage with loss of consciousness of unspecified duration, initial encounter: Secondary | ICD-10-CM

## 2018-07-20 DIAGNOSIS — W19XXXS Unspecified fall, sequela: Secondary | ICD-10-CM | POA: Diagnosis not present

## 2018-07-20 DIAGNOSIS — I129 Hypertensive chronic kidney disease with stage 1 through stage 4 chronic kidney disease, or unspecified chronic kidney disease: Secondary | ICD-10-CM | POA: Diagnosis not present

## 2018-07-20 DIAGNOSIS — Z9181 History of falling: Secondary | ICD-10-CM | POA: Diagnosis not present

## 2018-07-20 DIAGNOSIS — S065X9S Traumatic subdural hemorrhage with loss of consciousness of unspecified duration, sequela: Secondary | ICD-10-CM | POA: Diagnosis not present

## 2018-07-20 DIAGNOSIS — I255 Ischemic cardiomyopathy: Secondary | ICD-10-CM | POA: Diagnosis not present

## 2018-07-21 DIAGNOSIS — W19XXXS Unspecified fall, sequela: Secondary | ICD-10-CM | POA: Diagnosis not present

## 2018-07-21 DIAGNOSIS — E785 Hyperlipidemia, unspecified: Secondary | ICD-10-CM | POA: Diagnosis not present

## 2018-07-21 DIAGNOSIS — I129 Hypertensive chronic kidney disease with stage 1 through stage 4 chronic kidney disease, or unspecified chronic kidney disease: Secondary | ICD-10-CM | POA: Diagnosis not present

## 2018-07-21 DIAGNOSIS — Z87891 Personal history of nicotine dependence: Secondary | ICD-10-CM | POA: Diagnosis not present

## 2018-07-21 DIAGNOSIS — I255 Ischemic cardiomyopathy: Secondary | ICD-10-CM | POA: Diagnosis not present

## 2018-07-21 DIAGNOSIS — Z9181 History of falling: Secondary | ICD-10-CM | POA: Diagnosis not present

## 2018-07-21 DIAGNOSIS — N182 Chronic kidney disease, stage 2 (mild): Secondary | ICD-10-CM | POA: Diagnosis not present

## 2018-07-21 DIAGNOSIS — I2510 Atherosclerotic heart disease of native coronary artery without angina pectoris: Secondary | ICD-10-CM | POA: Diagnosis not present

## 2018-07-21 DIAGNOSIS — S065X9S Traumatic subdural hemorrhage with loss of consciousness of unspecified duration, sequela: Secondary | ICD-10-CM | POA: Diagnosis not present

## 2018-07-21 DIAGNOSIS — Z7982 Long term (current) use of aspirin: Secondary | ICD-10-CM | POA: Diagnosis not present

## 2018-07-25 ENCOUNTER — Ambulatory Visit
Admission: RE | Admit: 2018-07-25 | Discharge: 2018-07-25 | Disposition: A | Discharge status: Home / Self Care | Payer: Medicare Other | Source: Ambulatory Visit | Attending: Neurosurgery | Admitting: Neurosurgery

## 2018-07-25 ENCOUNTER — Telehealth: Payer: Self-pay | Admitting: Interventional Cardiology

## 2018-07-25 ENCOUNTER — Other Ambulatory Visit: Payer: Medicare Other

## 2018-07-25 ENCOUNTER — Other Ambulatory Visit: Payer: Self-pay

## 2018-07-25 DIAGNOSIS — S065X9A Traumatic subdural hemorrhage with loss of consciousness of unspecified duration, initial encounter: Secondary | ICD-10-CM

## 2018-07-25 DIAGNOSIS — S065XAA Traumatic subdural hemorrhage with loss of consciousness status unknown, initial encounter: Secondary | ICD-10-CM

## 2018-07-25 NOTE — Telephone Encounter (Signed)
New Message    Pt c/o medication issue:  1. Name of Medication: Rosuvastatin   2. How are you currently taking this medication (dosage and times per day)? 5mg  1x daily   3. Are you having a reaction (difficulty breathing--STAT)? No   4. What is your medication issue? The pts daughter is calling because she said he cant take this medication, it is making his muscles ache and some constipation   Please call

## 2018-07-25 NOTE — Telephone Encounter (Signed)
He needs PCSK-9 therapy. Please refer to lipid clinic. Can be addressed virtually. He is high risk for recurrent cardiac and cerebral events.

## 2018-07-25 NOTE — Telephone Encounter (Signed)
Called and spoke with patient about setting up a time to discuss alternative medications/doses for his cholesterol. Patient stated he "did not want to take anymore of that" I tried to explain that there were other medications (other than statins) or alterative doses that are better tolerated that would reduce his risk of having another heart attack or having a stroke. Patient stated he "didn't want to do anything until he goes back up there to see the doctor." Attempted to explain to patient that Dr. Katrinka Blazing requested that we speak with him about all his options. He is at high risk of having another event and Dr. Katrinka Blazing specifically asked [place] to review all of his options with him, but ultimately the decision was his. Expressed again that he was at high risk of another event and there were alternative medications to help reduce his risk. Patient stated that "its drugs that got him in this mess and he doesn't take drugs." Patient refused to discuss any further and hung up.

## 2018-07-25 NOTE — Telephone Encounter (Signed)
Per pt okay to discuss medical care with daughter Per daughter  Pt is complaining of constipation and muscle pain in legs this has occurred since starting Rosuvastatin 5 mg on 07/18/18 per daughter will have pt stop since this occurring Encouraged to give more time and see if gets better daughter would like Dr Michaelle Copas recommendations will forward to Dr Katrinka Blazing for review and recommendations ./cy

## 2018-07-25 NOTE — Telephone Encounter (Signed)
Thank you so much for trying! He and family are difficult to penetrate relative to secondary risk prevention.

## 2018-07-27 DIAGNOSIS — W19XXXS Unspecified fall, sequela: Secondary | ICD-10-CM | POA: Diagnosis not present

## 2018-07-27 DIAGNOSIS — E785 Hyperlipidemia, unspecified: Secondary | ICD-10-CM | POA: Diagnosis not present

## 2018-07-27 DIAGNOSIS — I129 Hypertensive chronic kidney disease with stage 1 through stage 4 chronic kidney disease, or unspecified chronic kidney disease: Secondary | ICD-10-CM | POA: Diagnosis not present

## 2018-07-27 DIAGNOSIS — I255 Ischemic cardiomyopathy: Secondary | ICD-10-CM | POA: Diagnosis not present

## 2018-07-27 DIAGNOSIS — Z87891 Personal history of nicotine dependence: Secondary | ICD-10-CM | POA: Diagnosis not present

## 2018-07-27 DIAGNOSIS — S065X9S Traumatic subdural hemorrhage with loss of consciousness of unspecified duration, sequela: Secondary | ICD-10-CM | POA: Diagnosis not present

## 2018-07-27 DIAGNOSIS — Z9181 History of falling: Secondary | ICD-10-CM | POA: Diagnosis not present

## 2018-07-27 DIAGNOSIS — N182 Chronic kidney disease, stage 2 (mild): Secondary | ICD-10-CM | POA: Diagnosis not present

## 2018-07-27 DIAGNOSIS — Z7982 Long term (current) use of aspirin: Secondary | ICD-10-CM | POA: Diagnosis not present

## 2018-07-27 DIAGNOSIS — I2510 Atherosclerotic heart disease of native coronary artery without angina pectoris: Secondary | ICD-10-CM | POA: Diagnosis not present

## 2018-08-02 ENCOUNTER — Other Ambulatory Visit: Payer: Self-pay | Admitting: Neurosurgery

## 2018-08-02 DIAGNOSIS — S065X9A Traumatic subdural hemorrhage with loss of consciousness of unspecified duration, initial encounter: Secondary | ICD-10-CM

## 2018-08-02 DIAGNOSIS — S065XAA Traumatic subdural hemorrhage with loss of consciousness status unknown, initial encounter: Secondary | ICD-10-CM

## 2018-08-04 ENCOUNTER — Other Ambulatory Visit: Payer: Self-pay | Admitting: *Deleted

## 2018-08-04 ENCOUNTER — Telehealth: Payer: Self-pay | Admitting: Interventional Cardiology

## 2018-08-04 NOTE — Telephone Encounter (Signed)
Spoke with daughter, ok per pt, advised we will cancel lab appt since pt didn't tolerate medication.  Daughter appreciative for call.

## 2018-08-04 NOTE — Telephone Encounter (Signed)
New Message  Patients daughter Victorino Dike is calling in reference to patients lab appt. She states that her father can not take the cholesterol medication and Dr. Katrinka Blazing is aware. She wants to know if the lab is still needed. Please call.

## 2018-08-15 ENCOUNTER — Other Ambulatory Visit: Payer: Medicare Other

## 2018-08-24 ENCOUNTER — Other Ambulatory Visit: Payer: Self-pay | Admitting: Physical Medicine and Rehabilitation

## 2018-08-31 ENCOUNTER — Other Ambulatory Visit: Payer: Self-pay

## 2018-08-31 ENCOUNTER — Encounter: Payer: Self-pay | Admitting: Physical Medicine & Rehabilitation

## 2018-08-31 ENCOUNTER — Encounter: Payer: Medicare Other | Attending: Physical Medicine & Rehabilitation | Admitting: Physical Medicine & Rehabilitation

## 2018-08-31 VITALS — Ht 70.0 in | Wt 160.0 lb

## 2018-08-31 DIAGNOSIS — S069X9S Unspecified intracranial injury with loss of consciousness of unspecified duration, sequela: Secondary | ICD-10-CM | POA: Diagnosis not present

## 2018-08-31 NOTE — Progress Notes (Signed)
Subjective:    Patient ID: Craig [name], male    DOB: 1934/05/13, 84 y.o.   MRN: [id number]  HPI   Due to national recommendations of social distancing because of COVID 6119, an audio/video tele-health visit is felt to be the most appropriate encounter for this patient at this time. See MyChart message from today for the patient's consent to a tele-health encounter with [facility]. This is a follow up telephone visit for the patient who is at home. MD is at office.    I am meeting with the patient today regarding his SDH.  He has been doing well at home.  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  He has not returned to driving yet.  He did have to come off his statin due to myalgias and has been feeling much better since doing so.  He denies any problems sleeping.  Appetite is been great.  Moods been positive.  Cognitively wife feels that he is near baseline.  He has followed up with neurosurgery and apparently has another CT scheduled for next month.  He remains on Keppra for seizure prophylaxis 500 mg twice daily.  Bowel and bladder are working normally.  He denies any falls or major mishaps at home.  He likes to walk every day.  Therapy has discharged him.  Pain Inventory Average Pain 0 Pain Right Now 0 My pain is na  In the last 24 hours, has pain interfered with the following? General activity 0 Relation with others 0 Enjoyment of life 0 What TIME of day is your pain at its worst? na Sleep (in general) na  Pain is worse with: na Pain improves with: na Relief from Meds: na  Mobility walk without assistance ability to climb steps?  yes do you drive?  yes  Function retired  Neuro/Psych No problems in this area  Prior Studies Any changes since last visit?  no  Physicians involved in your care Any changes since last visit?  no   Family History  Problem Relation  Age of Onset  . Hyperlipidemia Father   . Hypertension Father    Social History   Socioeconomic History  . Marital status: Married    Spouse name: Not on file  . Number of children: Not on file  . Years of education: Not on file  . Highest education level: Not on file  Occupational History  . Not on file  Social Needs  . Financial resource strain: Not on file  . Food insecurity:    Worry: Not on file    Inability: Not on file  . Transportation needs:    Medical: No    Non-medical: No  Tobacco Use  . Smoking status: Former Smoker    Last attempt to quit: 1960    Years since quitting: 60.4  . Smokeless tobacco: Current User    Types: Chew  Substance and Sexual Activity  . Alcohol use: Never    Frequency: Never  . Drug use: Never  . Sexual activity: Not on file  Lifestyle  . Physical activity:    Days per week: 5 days    Minutes per session: Not on file  . Stress: Not at all  Relationships  . Social connections:    Talks on phone: Not on file    Gets together: Not on file    Attends religious service:  Not on file    Active member of club or organization: Not on file    Attends meetings of clubs or organizations: Not on file    Relationship status: Not on file  Other Topics Concern  . Not on file  Social History Narrative  . Not on file   Past Surgical History:  Procedure Laterality Date  . BURR HOLE N/A 06/27/2018   Procedure: [name];  Surgeon: Donalee Citrin, MD;  Location: [facility] OR;  Service: Neurosurgery;  Laterality: N/A;  . CORONARY/GRAFT ACUTE MI REVASCULARIZATION N/A 05/30/2018   Procedure: CORONARY/GRAFT ACUTE MI REVASCULARIZATION;  Surgeon: Lyn Records, MD;  [facility] INVASIVE CV LAB;  Service: Cardiovascular;  Laterality: N/A;  . LEFT HEART CATH AND CORONARY ANGIOGRAPHY N/A 05/30/2018   Procedure: LEFT HEART CATH AND CORONARY ANGIOGRAPHY;  Surgeon: Lyn Records, MD;  Location: [facility] INVASIVE CV LAB;  Service: Cardiovascular;  Laterality: N/A;   Past  Medical History:  Diagnosis Date  . CAD (coronary artery disease)    a.  inferior STEMI s/p DES to D1 with residual disease - planned PCI to RCA, EF 45-50%.  . CKD (chronic kidney disease), stage II   . Hyperlipidemia, mild   . Hypertension   . Ischemic cardiomyopathy   . Kidney stones   . Pre-diabetes   . RBBB   . Thrombocytopenia (HCC)   . Tobacco abuse    Ht 5\' 10"  (1.778 m)   Wt 160 lb (72.6 kg)   BMI 22.96 kg/m   Opioid Risk Score:   Fall Risk Score:  `1  Depression screen PHQ 2/9  No flowsheet data found.   Review of Systems  Constitutional: Negative.   HENT: Negative.   Eyes: Negative.   Respiratory: Negative.   Cardiovascular: Negative.   Gastrointestinal: Negative.   Endocrine: Negative.   Genitourinary: Negative.   Musculoskeletal: Negative.   Skin: Negative.   Allergic/Immunologic: Negative.   Neurological: Negative.   Hematological: Negative.   Psychiatric/Behavioral: Negative.   All other systems reviewed and are negative.     Assessment & Plan:  1.Traumatic brain injurysecondary to fall with subdural hematoma             -continue HEP  -NS follow up arranged for next month  -discussed driving. May begin driving in low traffic with supervision of wife  -discussed safety with activities at home, in heat, etc.  2.  Pain Management:mild to moderate left knee pain  -ice, strengthening, observation  Can see him back for his in office if needed 3. Seizure prophylaxis: Continue Keppra bid.No seizure activity  -can reduce to 500mg  qhs  -follow up with NS next month. Can d/c potentially at that time  11 minutes of tele-visit time was spent with this patient today. Follow up as needed

## 2018-09-07 DIAGNOSIS — E119 Type 2 diabetes mellitus without complications: Secondary | ICD-10-CM | POA: Diagnosis not present

## 2018-09-07 DIAGNOSIS — S065X9A Traumatic subdural hemorrhage with loss of consciousness of unspecified duration, initial encounter: Secondary | ICD-10-CM | POA: Diagnosis not present

## 2018-09-07 DIAGNOSIS — I213 ST elevation (STEMI) myocardial infarction of unspecified site: Secondary | ICD-10-CM | POA: Diagnosis not present

## 2018-09-07 DIAGNOSIS — I2510 Atherosclerotic heart disease of native coronary artery without angina pectoris: Secondary | ICD-10-CM | POA: Diagnosis not present

## 2018-09-12 ENCOUNTER — Ambulatory Visit: Payer: Medicare Other | Admitting: Interventional Cardiology

## 2018-09-13 ENCOUNTER — Other Ambulatory Visit: Payer: Self-pay

## 2018-09-13 ENCOUNTER — Ambulatory Visit
Admission: RE | Admit: 2018-09-13 | Discharge: 2018-09-13 | Disposition: A | Discharge status: Home / Self Care | Payer: Medicare Other | Source: Ambulatory Visit | Attending: Neurosurgery | Admitting: Neurosurgery

## 2018-09-13 DIAGNOSIS — S065XAA Traumatic subdural hemorrhage with loss of consciousness status unknown, initial encounter: Secondary | ICD-10-CM

## 2018-09-13 DIAGNOSIS — S065X9A Traumatic subdural hemorrhage with loss of consciousness of unspecified duration, initial encounter: Secondary | ICD-10-CM

## 2018-09-13 DIAGNOSIS — I6200 Nontraumatic subdural hemorrhage, unspecified: Secondary | ICD-10-CM | POA: Diagnosis not present

## 2018-09-26 ENCOUNTER — Ambulatory Visit: Payer: Medicare Other | Admitting: Nurse Practitioner

## 2018-09-28 ENCOUNTER — Other Ambulatory Visit: Payer: Self-pay | Admitting: Physical Medicine & Rehabilitation

## 2018-10-10 ENCOUNTER — Other Ambulatory Visit: Payer: Self-pay | Admitting: Physical Medicine & Rehabilitation

## 2018-10-28 ENCOUNTER — Telehealth: Payer: Self-pay

## 2018-10-28 NOTE — Telephone Encounter (Signed)

## 2018-10-29 NOTE — Progress Notes (Signed)
Cardiology Office Note:    Date:  10/31/2018   ID:  Craig ParaCharles F Wollen, DOB Jul 06, 1934, MRN [id number]  PCP:  Lonie Peakonroy, Nathan, PA-C  Cardiologist:  Lesleigh NoeHenry W Dorina Ribaudo III, MD   Referring MD: Practice, Duanne Limerickandolph Heal*   Chief Complaint  Patient presents with  . Coronary Artery Disease  . Advice Only    Subdural hematoma on DAPT    History of Present Illness:    Craig [name] is a 84 y.o. male with a hx of STEMI s/p PCI/DES x1 diag, residual significant disease in RCA, HTN, HL, RBBB, CKD, bilateral subdural hematoma on DAPT, likely traumatic induced.  He fell because of neurological symptoms which raise the question that there was already an issue.  He is doing well.  Could not tolerate even low-dose statin therapy after extraction of bilateral subdural hematomas.  He denies palpitations and syncope.  Statin therapy was stopped about 2 months ago and he states chest returning to normal.  We discussed residual coronary disease.  We were preparing to treat his right coronary when bilateral subdural hematomas occurred.  We have subsequently decided to be more conservative.  Has had some vague left parasternal discomfort.  It occurs spontaneously.  Lasts less than 5 minutes.  Will pay closer attention.   Past Medical History:  Diagnosis Date  . CAD (coronary artery disease)    a.  inferior STEMI s/p DES to D1 with residual disease - planned PCI to RCA, EF 45-50%.  . CKD (chronic kidney disease), stage II   . Hyperlipidemia, mild   . Hypertension   . Ischemic cardiomyopathy   . Kidney stones   . Pre-diabetes   . RBBB   . Thrombocytopenia (HCC)   . Tobacco abuse     Past Surgical History:  Procedure Laterality Date  . BURR HOLE N/A 06/27/2018   Procedure: Ezekiel InaBURR HOLES;  Surgeon: Donalee Citrinram, Gary, MD;  [facility];  Service: Neurosurgery;  Laterality: N/A;  . CORONARY/GRAFT ACUTE MI REVASCULARIZATION N/A 05/30/2018   Procedure: CORONARY/GRAFT ACUTE MI REVASCULARIZATION;  Surgeon: Lyn RecordsSmith,  Ariann Khaimov W, MD;  [facility];  Service: Cardiovascular;  Laterality: N/A;  . LEFT HEART CATH AND CORONARY ANGIOGRAPHY N/A 05/30/2018   Procedure: LEFT HEART CATH AND CORONARY ANGIOGRAPHY;  Surgeon: Lyn RecordsSmith, Anabel Lykins W, MD;  Location: [facility] INVASIVE CV LAB;  Service: Cardiovascular;  Laterality: N/A;    Current Medications: Current Meds  Medication Sig  . aspirin EC 81 MG EC tablet Take 1 tablet (81 mg total) by mouth daily.  Marland Kitchen. levETIRAcetam (KEPPRA) 500 MG tablet TAKE 1 TABLET BY MOUTH TWICE A DAY  . loratadine (CLARITIN) 10 MG tablet Take 10 mg by mouth daily.     Allergies:   Nicoderm [nicotine] and Statins   Social History   Socioeconomic History  . Marital status: Married    Spouse name: Not on file  . Number of children: Not on file  . Years of education: Not on file  . Highest education level: Not on file  Occupational History  . Not on file  Social Needs  . Financial resource strain: Not on file  . Food insecurity    Worry: Not on file    Inability: Not on file  . Transportation needs    Medical: No    Non-medical: No  Tobacco Use  . Smoking status: Former Smoker    Quit date: 1960    Years since quitting: 60.6  . Smokeless tobacco: Current User  Types: Chew  Substance and Sexual Activity  . Alcohol use: Never    Frequency: Never  . Drug use: Never  . Sexual activity: Not on file  Lifestyle  . Physical activity    Days per week: 5 days    Minutes per session: Not on file  . Stress: Not at all  Relationships  . Social Herbalist on phone: Not on file    Gets together: Not on file    Attends religious service: Not on file    Active member of club or organization: Not on file    Attends meetings of clubs or organizations: Not on file    Relationship status: Not on file  Other Topics Concern  . Not on file  Social History Narrative  . Not on file     Family History: The patient's family history includes Hyperlipidemia in his father;  Hypertension in his father.  ROS:   Please see the history of present illness.    No neurological complaints.  All other systems reviewed and are negative.  EKGs/Labs/Other Studies Reviewed:    The following studies were reviewed today: None  EKG:  EKG normal sinus rhythm, heart rate 67, right bundle branch block, left atrial abnormality.  When compared to the prior tracing from 06/24/2018, heart rate is slower.   Recent Labs: 05/30/2018: B Natriuretic Peptide 40.6; TSH 1.558 06/29/2018: Magnesium 2.0 07/01/2018: ALT 17 07/04/2018: BUN 19; Creatinine, Ser 1.10; Hemoglobin 13.4; Platelets 206; Potassium 3.7; Sodium 136  Recent Lipid Panel    Component Value Date/Time   CHOL 152 05/30/2018 1551   TRIG 151 (H) 05/30/2018 1551   HDL 31 (L) 05/30/2018 1551   CHOLHDL 4.9 05/30/2018 1551   VLDL 30 05/30/2018 1551   LDLCALC 91 05/30/2018 1551    Physical Exam:    VS:  BP 124/78   Pulse 67   Ht 5\' 10"  (1.778 m)   Wt 174 lb 12.8 oz (79.3 kg)   SpO2 99%   BMI 25.08 kg/m     Wt Readings from Last 3 Encounters:  10/31/18 174 lb 12.8 oz (79.3 kg)  08/31/18 160 lb (72.6 kg)  07/18/18 162 lb (73.5 kg)     GEN: Asked, elderly. No acute distress HEENT: Normal NECK: No JVD. LYMPHATICS: No lymphadenopathy CARDIAC:  RRR without murmur, gallop, or edema. VASCULAR:  Normal Pulses. No bruits. RESPIRATORY:  Clear to auscultation without rales, wheezing or rhonchi  ABDOMEN: Soft, non-tender, non-distended, No pulsatile mass, MUSCULOSKELETAL: No deformity  SKIN: Warm and dry NEUROLOGIC:  Alert and oriented x 3 PSYCHIATRIC:  Normal affect   ASSESSMENT:    1. CAD in native artery   2. Ischemic cardiomyopathy   3. Hyperlipidemia, unspecified hyperlipidemia type   4. Bilateral subdural hematomas (HCC)   5. Essential hypertension   6. Educated About Covid-19 Virus Infection    PLAN:    In order of problems listed above:  1. Secondary prevention discussed.  Will send to the lipid  clinic to see if PCSK9 therapy can be instituted.  He has high risk for vascular events. 2. No heart failure symptoms 3. Target LDL less than 50.  PCSK9 therapy preferred. 4. Assume that subdural hematomas have resolved. 5. Target blood pressure 130/80 mmHg or less 6. Masking, distancing, and handwashing discussed in detail.  Overall education and awareness concerning primary/secondary risk prevention was discussed in detail: LDL less than 70, hemoglobin A1c less than 7, blood pressure target less than 130/80 mmHg, >  150 minutes of moderate aerobic activity per week, avoidance of smoking, weight control (via diet and exercise), and continued surveillance/management of/for obstructive sleep apnea.    Medication Adjustments/Labs and Tests Ordered: Current medicines are reviewed at length with the patient today.  Concerns regarding medicines are outlined above.  Orders Placed This Encounter  Procedures  . EKG 12-Lead   No orders of the defined types were placed in this encounter.   There are no Patient Instructions on file for this visit.   Signed, Lesleigh NoeHenry W Joella Saefong III, MD  10/31/2018 12:02 PM    [facility]

## 2018-10-31 ENCOUNTER — Other Ambulatory Visit: Payer: Self-pay

## 2018-10-31 ENCOUNTER — Encounter: Payer: Self-pay | Admitting: Interventional Cardiology

## 2018-10-31 ENCOUNTER — Ambulatory Visit (INDEPENDENT_AMBULATORY_CARE_PROVIDER_SITE_OTHER): Payer: Medicare Other | Admitting: Interventional Cardiology

## 2018-10-31 VITALS — BP 124/78 | HR 67 | Ht 70.0 in | Wt 174.8 lb

## 2018-10-31 DIAGNOSIS — E785 Hyperlipidemia, unspecified: Secondary | ICD-10-CM | POA: Diagnosis not present

## 2018-10-31 DIAGNOSIS — S065X9A Traumatic subdural hemorrhage with loss of consciousness of unspecified duration, initial encounter: Secondary | ICD-10-CM

## 2018-10-31 DIAGNOSIS — I2510 Atherosclerotic heart disease of native coronary artery without angina pectoris: Secondary | ICD-10-CM | POA: Diagnosis not present

## 2018-10-31 DIAGNOSIS — Z7189 Other specified counseling: Secondary | ICD-10-CM

## 2018-10-31 DIAGNOSIS — S065XAA Traumatic subdural hemorrhage with loss of consciousness status unknown, initial encounter: Secondary | ICD-10-CM

## 2018-10-31 DIAGNOSIS — I10 Essential (primary) hypertension: Secondary | ICD-10-CM | POA: Diagnosis not present

## 2018-10-31 DIAGNOSIS — I255 Ischemic cardiomyopathy: Secondary | ICD-10-CM | POA: Diagnosis not present

## 2018-10-31 MED ORDER — NITROGLYCERIN 0.4 MG SL SUBL: 0.4000 mg | SUBLINGUAL_TABLET | SUBLINGUAL | 2 refills | Status: DC | PRN

## 2018-10-31 NOTE — Patient Outreach (Signed)
[facility]) Care Management  10/31/2018  Craig [name] 11-Oct-1934 [id number]   Medication Adherence call to Craig [name] [place] Identifiers Verify spoke with patient he is past due on Rosuvastatin 5 mg patient explain he is no longer taking this medication he was having side effects doctor took him off. Craig [name] is showing past due under [place].   [place] Management Direct Dial [phone]  Fax [phone] [email or web address]

## 2018-10-31 NOTE — Patient Instructions (Addendum)
Medication Instructions:  Your physician recommends that you continue on your current medications as directed. Please refer to the Current Medication list given to you today.  If you need a refill on your cardiac medications before your next appointment, please call your pharmacy.   Lab work: None If you have labs (blood work) drawn today and your tests are completely normal, you will receive your results only by: Marland Kitchen MyChart Message (if you have MyChart) OR . A paper copy in the mail If you have any lab test that is abnormal or we need to change your treatment, we will call you to review the results.  Testing/Procedures: None  Follow-Up: You have been referred to our Lipid Clinic here in the office.   At [facility], you and your health needs are our priority.  As part of our continuing mission to provide you with exceptional heart care, we have created designated Provider Care Teams.  These Care Teams include your primary Cardiologist (physician) and Advanced Practice Providers (APPs -  Physician Assistants and Nurse Practitioners) who all work together to provide you with the care you need, when you need it. You will need a follow up appointment in 6 months.  Please call our office 2 months in advance to schedule this appointment.  You may see Sinclair Grooms, MD or one of the following Advanced Practice Providers on your designated Care Team:   Truitt Merle, NP Cecilie Kicks, NP . Kathyrn Drown, NP  Any Other Special Instructions Will Be Listed Below (If Applicable).

## 2018-11-04 ENCOUNTER — Telehealth: Payer: Self-pay

## 2018-11-04 NOTE — Telephone Encounter (Signed)

## 2018-11-07 ENCOUNTER — Encounter: Payer: Self-pay | Admitting: Pharmacist

## 2018-11-07 ENCOUNTER — Ambulatory Visit (INDEPENDENT_AMBULATORY_CARE_PROVIDER_SITE_OTHER): Payer: Medicare Other | Admitting: Pharmacist

## 2018-11-07 ENCOUNTER — Other Ambulatory Visit: Payer: Self-pay

## 2018-11-07 DIAGNOSIS — E785 Hyperlipidemia, unspecified: Secondary | ICD-10-CM | POA: Diagnosis not present

## 2018-11-07 NOTE — Progress Notes (Signed)
Patient ID: Craig [name]                 DOB: [birth year]                    MRN: [id number]     HPI: Craig [name] is a 84 y.o. male patient referred to lipid clinic by Dr. Tamala Julian. PMH is significant for STEMI s/p PCI/DES x1 diag, residual significant disease in RCA, HTN, HL, RBBB, CKD, bilateral subdural hematoma on DAPT, likely traumatic induced.    Patient presents today with his daughter to discuss treatment for his cholesterol and to lower his cardiovascular risk. Patient is intolerant to statins and doesn't desire to try any other statin.  Current Medications: none Intolerances: rosuvastatin 5mg  daily, atorvastatin 80mg  daily (musle pains//stiffness) Risk Factors: STEMI w/ residual disease LDL goal: <70  Diet: raisin brain, grits, fruit, beans, rare chicken and biscuit, veggies, rarely eats meat, loves fish  Exercise: very active- works in garden, cuts wood  Labs: 05/30/2018 TC 152, TG 151, HDL 31, LDL 91 (no therapy)  Past Medical History:  Diagnosis Date  . CAD (coronary artery disease)    a.  inferior STEMI s/p DES to D1 with residual disease - planned PCI to RCA, EF 45-50%.  . CKD (chronic kidney disease), stage II   . Hyperlipidemia, mild   . Hypertension   . Ischemic cardiomyopathy   . Kidney stones   . Pre-diabetes   . RBBB   . Thrombocytopenia ([place])   . Tobacco abuse     Current Outpatient Medications on File Prior to Visit  Medication Sig Dispense Refill  . aspirin EC 81 MG EC tablet Take 1 tablet (81 mg total) by mouth daily. 30 tablet 0  . levETIRAcetam (KEPPRA) 500 MG tablet TAKE 1 TABLET BY MOUTH TWICE A DAY 180 tablet 0  . loratadine (CLARITIN) 10 MG tablet Take 10 mg by mouth daily.    . nitroGLYCERIN (NITROSTAT) 0.4 MG SL tablet Place 1 tablet (0.4 mg total) under the tongue every 5 (five) minutes as needed for chest pain. 25 tablet 2   No current facility-administered medications on file prior to visit.     Allergies  Allergen Reactions   . Nicoderm [Nicotine] Itching  . Statins Other (See Comments)    blackouts    Assessment/Plan:  1. Hyperlipidemia - LDL is above goal of <70. Per Dr. Tamala Julian, aggressive treatment is desired due to residual coronary disease. I had a long discussion with patient and his daughter about treatment options including PCSK9 inhibitors, zetia and Nexletol. Discussed the lack of cardiovascular benefit/unknown benefit of the latter. Discussed the cardiovascular risk reduction thought to be provided with PCSK9 inhibitors even if used monotherapy. Discussed side effects and the reason behind treating aggressively. Patient expressed concern over taking to take for life. Explained that with preventative medications, we would want to treat until he no longer wished to prevent any further, usually the majority of the rest of our lives. Also discussed alternative dosing for statins, but patient does not wish to try. Patient agreeable to trying PCSK9 inhibitors. Will submit PA for Praluent 75mg  daily (patient concerned over the higher dose of Repatha). Follow up with patient once cost is known. Injection technique reviewed with patient.  Thank you,  Ramond Dial, Pharm.D, [place]  [address] [place], [place] [address]  Phone: [phone]; Fax: [phone]

## 2018-11-07 NOTE — Patient Instructions (Addendum)
It was nice to meet you this afternoon.  We will submit for authorization to your insurance company for either [place] or [place]. Once approved, we will call you with the price of the medication. We can apply for assistance with the copay if it is too expensive.  Call us at [phone] with any questions or concerns

## 2018-11-08 ENCOUNTER — Telehealth: Payer: Self-pay | Admitting: Pharmacist

## 2018-11-08 DIAGNOSIS — E785 Hyperlipidemia, unspecified: Secondary | ICD-10-CM

## 2018-11-08 MED ORDER — PRALUENT 75 MG/ML ~~LOC~~ SOAJ: 1.0000 "pen " | SUBCUTANEOUS | 11 refills | Status: DC

## 2018-11-08 NOTE — Telephone Encounter (Signed)
Praluent prior authorization approved through 05/10/19. Rx sent to local pharmacy, copay $142 [phone] deductible then $47/month). Spoke with pt who is ok with this copay, he is comfortable giving injections at home. Scheduled f/u lab work in October to assess efficacy.

## 2018-11-28 ENCOUNTER — Other Ambulatory Visit: Payer: Self-pay

## 2018-11-28 NOTE — Patient Outreach (Signed)
[facility]) Care Management  11/28/2018  CARRICK RIJOS Apr 03, 1935 [id number]   Medication Adherence call to Mr. Jovaughn Wojtaszek left a message with patient wife to call back patient is showing past due on Rosuvastatin 5 mg under [place].  [place] Management Direct Dial [phone]  Fax [phone] [email or web address]

## 2019-01-09 ENCOUNTER — Other Ambulatory Visit: Payer: Medicare Other | Admitting: *Deleted

## 2019-01-09 ENCOUNTER — Other Ambulatory Visit: Payer: Self-pay

## 2019-01-09 DIAGNOSIS — E785 Hyperlipidemia, unspecified: Secondary | ICD-10-CM

## 2019-01-09 LAB — LIPID PANEL
Chol/HDL Ratio: 2.1 ratio (ref 0.0–5.0)
Cholesterol, Total: 105 mg/dL (ref 100–199)
HDL: 50 mg/dL (ref >39)
LDL Chol Calc (NIH): 41 mg/dL (ref 0–99)
Triglycerides: 65 mg/dL (ref 0–149)
VLDL Cholesterol Cal: 14 mg/dL (ref 5–40)

## 2019-01-09 LAB — HEPATIC FUNCTION PANEL
ALT: 12 IU/L (ref 0–44)
AST: 21 IU/L (ref 0–40)
Albumin: 4.4 g/dL (ref 3.6–4.6)
Alkaline Phosphatase: 89 IU/L (ref 39–117)
Bilirubin Total: 0.6 mg/dL (ref 0.0–1.2)
Bilirubin, Direct: 0.19 mg/dL (ref 0.00–0.40)
Total Protein: 6.6 g/dL (ref 6.0–8.5)

## 2019-01-10 DIAGNOSIS — S069X0S Unspecified intracranial injury without loss of consciousness, sequela: Secondary | ICD-10-CM | POA: Diagnosis not present

## 2019-01-10 DIAGNOSIS — I2510 Atherosclerotic heart disease of native coronary artery without angina pectoris: Secondary | ICD-10-CM | POA: Diagnosis not present

## 2019-01-10 DIAGNOSIS — E782 Mixed hyperlipidemia: Secondary | ICD-10-CM | POA: Diagnosis not present

## 2019-01-10 DIAGNOSIS — E119 Type 2 diabetes mellitus without complications: Secondary | ICD-10-CM | POA: Diagnosis not present

## 2019-01-24 ENCOUNTER — Other Ambulatory Visit: Payer: Self-pay

## 2019-01-24 NOTE — Patient Outreach (Signed)
[facility]) Care Management  01/24/2019  TIMARION AGCAOILI 01/21/1935 [id number]  Medication Adherence call to Mr. Josha Weekley HIPPA Compliant Voice message left with a call back number. Mr. Clingan is showing past due on Lisinopril 40 mg under [place].   [place] Management Direct Dial [phone]  Fax [phone] [email or web address]

## 2019-05-01 ENCOUNTER — Other Ambulatory Visit: Payer: Self-pay | Admitting: Interventional Cardiology

## 2019-05-10 ENCOUNTER — Telehealth: Payer: Self-pay | Admitting: Physician Assistant

## 2019-05-10 NOTE — Telephone Encounter (Signed)
Returned call to pt's daughter, Victorino Dike.  She has permission to accompany pt to his appointment due to memory loss.

## 2019-05-10 NOTE — Telephone Encounter (Signed)
Patient's daughter is calling requesting she attend the patients upcoming appointment scheduled for 05/15/19 due to the patient having memory loss from previous bilateral brain bleed. Please advise

## 2019-05-12 ENCOUNTER — Ambulatory Visit: Payer: Medicare Other | Admitting: Physician Assistant

## 2019-05-14 ENCOUNTER — Encounter: Payer: Self-pay | Admitting: Physician Assistant

## 2019-05-14 NOTE — Progress Notes (Signed)
Cardiology Office Note  Date: 05/15/2019   ID: Craig [name], DOB 10/19/1934, MRN [id number]  PCP:  Cyndi Bender, PA-C  Cardiologist:  Sinclair Grooms, MD Electrophysiologist:  None   Chief Complaint  Patient presents with  . Follow-up    CAD, hypertension    History of Present Illness: Craig [name] is a 85 y.o. male last saw Dr. Tamala Julian via office visit October 31, 2018.  History of STEMI status post PCI/DES x 1 diagonal, residual significant disease in RCA.  Other history includes hypertension, hyperlipidemia, right bundle branch block, CKD, bilateral subdural hematoma on DAPT likely traumatic induced.  He fell because of neurological symptoms which raised the question that there was already an issue.  At the visit in July 2020 patient was doing well but could not tolerate low-dose statin after extraction of bilateral subdural hematomas.  Patient has subsequently been started on Praluent after consult with pharmacist.  Dr. Tamala Julian had a plan to treat the patient's right coronary artery when the subdural hematomas occurred.  A subsequent decision was made to be more conservative.  At last visit patient had described some left parasternal discomfort occurring spontaneously lasting less than 5 minutes.   Patient currently denies any anginal or exertional symptoms. No dizziness or syncopal / near syncopal episodes. No CVA or TIA like symptoms. No palpitations or arrhythmias.   He states he has been helping his Grandson's  boss working outside splitting wood and loading trucks with firewood without any issues.   Past Medical History:  Diagnosis Date  . CAD (coronary artery disease)    a.  inferior STEMI s/p DES to D1 with residual disease - planned PCI to RCA, EF 45-50%.  . CKD (chronic kidney disease), stage II   . Hyperlipidemia, mild   . Hypertension   . Ischemic cardiomyopathy   . Kidney stones   . Pre-diabetes   . RBBB   . Statin intolerance   . Subdural hematoma  ([place])   . Thrombocytopenia ([place])   . Tobacco abuse     Past Surgical History:  Procedure Laterality Date  . BURR HOLE N/A 06/27/2018   Procedure: Haskell Flirt;  Surgeon: Kary Kos, MD;  Location: [facility];  Service: Neurosurgery;  Laterality: N/A;  . CORONARY/GRAFT ACUTE MI REVASCULARIZATION N/A 05/30/2018   Procedure: CORONARY/GRAFT ACUTE MI REVASCULARIZATION;  Surgeon: Belva Crome, MD;  Location: [facility];  Service: Cardiovascular;  Laterality: N/A;  . LEFT HEART CATH AND CORONARY ANGIOGRAPHY N/A 05/30/2018   Procedure: LEFT HEART CATH AND CORONARY ANGIOGRAPHY;  Surgeon: Belva Crome, MD;  Location: [facility];  Service: Cardiovascular;  Laterality: N/A;    Current Outpatient Medications  Medication Sig Dispense Refill  . Alirocumab (PRALUENT) 75 MG/ML SOAJ Inject 1 pen into the skin every 14 (fourteen) days. 2 pen 11  . aspirin EC 81 MG EC tablet Take 1 tablet (81 mg total) by mouth daily. 30 tablet 0  . nitroGLYCERIN (NITROSTAT) 0.4 MG SL tablet Place 1 tablet (0.4 mg total) under the tongue every 5 (five) minutes as needed for chest pain. 25 tablet 2   No current facility-administered medications for this visit.   Allergies:  Nicoderm [nicotine] and Statins   Social History: The patient  reports that he quit smoking about 61 years ago. His smokeless tobacco use includes chew. He reports that he does not drink alcohol or use drugs.   Family History: The patient's family history includes Hyperlipidemia in  his father; Hypertension in his father.   ROS:  Please see the history of present illness. Otherwise, complete review of systems is positive for none.  All other systems are reviewed and negative.   Physical Exam: VS:  BP (!) 150/76   Pulse 69   Ht 5' 10"  (1.778 m)   Wt 178 lb (80.7 kg)   SpO2 98%   BMI 25.54 kg/m , BMI Body mass index is 25.54 kg/m.  Wt Readings from Last 3 Encounters:  05/15/19 178 lb (80.7 kg)  10/31/18 174 lb 12.8 oz (79.3 kg)    08/31/18 160 lb (72.6 kg)    General: Patient appears comfortable at rest. HEENT: Conjunctiva and lids normal, oropharynx clear with moist mucosa. Neck: Supple, no elevated JVP or carotid bruits, no thyromegaly. Lungs: Clear to auscultation, nonlabored breathing at rest. Cardiac: Regular rate and rhythm, no S3 or significant systolic murmur, no pericardial rub. Abdomen: Soft, nontender, no hepatomegaly, bowel sounds present, no guarding or rebound. Extremities: No pitting edema, distal pulses 2+. Skin: Warm and dry. Musculoskeletal: No kyphosis. Neuropsychiatric: Alert and oriented x3, affect grossly appropriate.  ECG:  An ECG dated November 01, 2018 was personally reviewed today and demonstrated:  Normal sinus rhythm rate of 67 right bundle branch block.  Recent Labwork: 05/30/2018: B Natriuretic Peptide 40.6; TSH 1.558 06/29/2018: Magnesium 2.0 07/04/2018: BUN 19; Creatinine, Ser 1.10; Hemoglobin 13.4; Platelets 206; Potassium 3.7; Sodium 136 01/09/2019: ALT 12; AST 21     Component Value Date/Time   CHOL 105 01/09/2019 1000   TRIG 65 01/09/2019 1000   HDL 50 01/09/2019 1000   CHOLHDL 2.1 01/09/2019 1000   CHOLHDL 4.9 05/30/2018 1551   VLDL 30 05/30/2018 1551   LDLCALC 41 01/09/2019 1000    Other Studies Reviewed Today:  Left heart catheterization May 30, 2018  Acute lateral wall infarction due to thrombotic occlusion of a large first diagonal in its mid segment.  100% stenosis and TIMI grade 0 flow was converted to 0% stenosis with TIMI grade III flow following angioplasty and stenting using a Onyx 2.5 x 18 mm DES deployed at 14 atm.  Widely patent left main  LAD contains luminal irregularities up to 40% throughout the mid segment.  A small second and third diagonal contain up to 80% stenosis.  These diagonals are small.  Circumflex is patent with up to 50% narrowing noted in the mid vessel proximal and distal to the second obtuse marginal.  The right coronary was not  selectively engaged but contains a mid vessel eccentric 80% stenosis.  The distal territory was poorly visualized.  Left ventriculography demonstrated anterolateral severe hypokinesis.  Estimated ejection fraction 50%.  LVEDP 19 mmHg.   Echocardiogram May 31, 2018 IMPRESSIONS  1. The left ventricle has mildly reduced systolic function, with an  ejection fraction of 45-50%. The cavity size was normal. Left ventricular  diastolic Doppler parameters are consistent with pseudonormalization.  Severe hypokinesis of the anterolateral  wall and the mid inferolateral wall. Hypokinesis of the mid anterior wall.  2. The right ventricle has normal systolic function. The cavity was  normal. There is no increase in right ventricular wall thickness.  3. The mitral valve is normal in structure. There is mild mitral annular  calcification present. No evidence of mitral valve stenosis. Trivial  regurgitation.  4. The tricuspid valve is normal in structure.  5. The aortic valve is tricuspid Mild calcification of the aortic valve.  no stenosis of the aortic valve.  6. The  pulmonic valve was normal in structure.  7. The aortic root and ascending aorta are normal in size and structure.  8. The inferior vena cava was dilated in size with <50% respiratory  variability.  9. No complete TR doppler jet so unable to estimate PA systolic pressure.   Lower venous Doppler study July 01, 2018 Summary:  Right: There is no evidence of deep vein thrombosis in the lower  extremity. No cystic structure found in the popliteal fossa.  Left: There is no evidence of deep vein thrombosis in the lower extremity.  No cystic structure found in the popliteal fossa  CT of the head September 13, 2018 FINDINGS: Brain: Chronic appearing LEFT subdural hematoma, with a well-defined membrane medially. Mild mass effect on the LEFT posterior frontal and parietal cortex, with only slight LEFT-to-RIGHT shift.  Overall thickness is unchanged, 12-13 mm. Midline shift is redemonstrated of 4 mm. Significant improvement in RIGHT subdural hematoma, only minimal residual, no more than 3 mm thick.  Set Mr. Faythe Ghee doseVascular: Calcification of the cavernous internal carotid arteries consistent with cerebrovascular atherosclerotic disease. No signs of intracranial large vessel occlusion.  Assessment and Plan:  1. CAD in native artery   2. Ischemic cardiomyopathy   3. Hyperlipidemia LDL goal <70   4. Bilateral subdural hematomas (HCC)   5. Essential hypertension    1. CAD in native artery Status post acute lateral wall infarction in February 2020 with DES stenting of the 1st diagonal. Residual disease in LAD/diagonals and circumflex, and RCA. No recent progressive anginal or exertional symptoms. Continue aspirin 81 mg and nitroglycerin as needed 0.4 mg sublingual. He is no longer on Brilinta due to SDH. Get CBC  2. Ischemic cardiomyopathy Cardiac catheterization in February showed left ventricular ejection fraction to be 50% with severe anterolateral hypokinesis. Echocardiogram in February 2020 EF was 45 to 50%..Reports no anginal or exertional symptoms. States he has been splitting firewood and loading trucks without symptoms.  3. Hyperlipidemia LDL goal <70 Patient goes to Lipid clinic and is taking Praluent injections. Lipid clinic managing therapy.  4. Bilateral subdural hematomas (HCC) Patient had bilateral subdural hematomas secondary thought to be secondary to fall and anticoagulant therapy. Patient has fully recovered from this issue from a neurological standpoint. He is currently on low-dose aspirin 81 mg daily only. No focal neurological deficits noted.    5. Essential hypertension  Blood pressure initially elevated on arrival. BP 150/76. Recheck in right arm 122/80. currently on no antihypertensive medications. Patient does not check his blood pressures at home. Get BMET  Medication  Adjustments/Labs and Tests Ordered: Current medicines are reviewed at length with the patient today.  Concerns regarding medicines are outlined above.    Patient Instructions  Medication Instructions:  Your physician recommends that you continue on your current medications as directed. Please refer to the Current Medication list given to you today.  *If you need a refill on your cardiac medications before your next appointment, please call your pharmacy*  Lab Work: [place] - Today   If you have labs (blood work) drawn today and your tests are completely normal, you will receive your results only by: Marland Kitchen MyChart Message (if you have MyChart) OR . A paper copy in the mail If you have any lab test that is abnormal or we need to change your treatment, we will call you to review the results.  Testing/Procedures: None ordered   Follow-Up: At [facility], you and your health needs are our priority.  As part of our continuing mission to provide you with exceptional heart care, we have created designated Provider Care Teams.  These Care Teams include your primary Cardiologist (physician) and Advanced Practice Providers (APPs -  Physician Assistants and Nurse Practitioners) who all work together to provide you with the care you need, when you need it.  Your next appointment:   6 month(s)  The format for your next appointment:   In Person  Provider:   You may see Sinclair Grooms, MD or one of the following Advanced Practice Providers on your designated Care Team:    Truitt Merle, NP  Cecilie Kicks, NP  Kathyrn Drown, NP   Other Instructions None           Signed, Levell July, NP 05/15/2019 5:03 PM    [place]

## 2019-05-15 ENCOUNTER — Other Ambulatory Visit: Payer: Self-pay

## 2019-05-15 ENCOUNTER — Ambulatory Visit: Payer: Medicare Other | Admitting: Family Medicine

## 2019-05-15 ENCOUNTER — Encounter: Payer: Self-pay | Admitting: Physician Assistant

## 2019-05-15 VITALS — BP 150/76 | HR 69 | Ht 70.0 in | Wt 178.0 lb

## 2019-05-15 DIAGNOSIS — E785 Hyperlipidemia, unspecified: Secondary | ICD-10-CM | POA: Diagnosis not present

## 2019-05-15 DIAGNOSIS — I2510 Atherosclerotic heart disease of native coronary artery without angina pectoris: Secondary | ICD-10-CM

## 2019-05-15 DIAGNOSIS — S065X9A Traumatic subdural hemorrhage with loss of consciousness of unspecified duration, initial encounter: Secondary | ICD-10-CM

## 2019-05-15 DIAGNOSIS — I10 Essential (primary) hypertension: Secondary | ICD-10-CM

## 2019-05-15 DIAGNOSIS — I255 Ischemic cardiomyopathy: Secondary | ICD-10-CM

## 2019-05-15 DIAGNOSIS — S065XAA Traumatic subdural hemorrhage with loss of consciousness status unknown, initial encounter: Secondary | ICD-10-CM

## 2019-05-15 NOTE — Patient Instructions (Signed)
Medication Instructions:  Your physician recommends that you continue on your current medications as directed. Please refer to the Current Medication list given to you today.  *If you need a refill on your cardiac medications before your next appointment, please call your pharmacy*  Lab Work: [facility] - Today   If you have labs (blood work) drawn today and your tests are completely normal, you will receive your results only by: Marland Kitchen MyChart Message (if you have MyChart) OR . A paper copy in the mail If you have any lab test that is abnormal or we need to change your treatment, we will call you to review the results.  Testing/Procedures: None ordered   Follow-Up: At [facility], you and your health needs are our priority.  As part of our continuing mission to provide you with exceptional heart care, we have created designated Provider Care Teams.  These Care Teams include your primary Cardiologist (physician) and Advanced Practice Providers (APPs -  Physician Assistants and Nurse Practitioners) who all work together to provide you with the care you need, when you need it.  Your next appointment:   6 month(s)  The format for your next appointment:   In Person  Provider:   You may see Lesleigh Noe, MD or one of the following Advanced Practice Providers on your designated Care Team:    Norma Fredrickson, NP  Nada Boozer, NP  Georgie Chard, NP   Other Instructions None

## 2019-05-16 LAB — CBC
Hematocrit: 44.4 % (ref 37.5–51.0)
Hemoglobin: 15.1 g/dL (ref 13.0–17.7)
MCH: 30.8 pg (ref 26.6–33.0)
MCHC: 34 g/dL (ref 31.5–35.7)
MCV: 91 fL (ref 79–97)
Platelets: 135 10*3/uL — ABNORMAL LOW (ref 150–450)
RBC: 4.9 x10E6/uL (ref 4.14–5.80)
RDW: 12.2 % (ref 11.6–15.4)
WBC: 5.6 10*3/uL (ref 3.4–10.8)

## 2019-05-16 LAB — BASIC METABOLIC PANEL
BUN/Creatinine Ratio: 16 (ref 10–24)
BUN: 17 mg/dL (ref 8–27)
CO2: 22 mmol/L (ref 20–29)
Calcium: 9.2 mg/dL (ref 8.6–10.2)
Chloride: 104 mmol/L (ref 96–106)
Creatinine, Ser: 1.04 mg/dL (ref 0.76–1.27)
GFR calc Af Amer: 76 mL/min/{1.73_m2} (ref >59)
GFR calc non Af Amer: 66 mL/min/{1.73_m2} (ref >59)
Glucose: 97 mg/dL (ref 65–99)
Potassium: 4.1 mmol/L (ref 3.5–5.2)
Sodium: 141 mmol/L (ref 134–144)

## 2019-05-18 ENCOUNTER — Telehealth: Payer: Self-pay | Admitting: *Deleted

## 2019-05-18 NOTE — Telephone Encounter (Signed)
Wife informed. Copy sent to PCP 

## 2019-06-06 DIAGNOSIS — T2120XA Burn of second degree of trunk, unspecified site, initial encounter: Secondary | ICD-10-CM | POA: Diagnosis not present

## 2019-06-08 DIAGNOSIS — Z9181 History of falling: Secondary | ICD-10-CM | POA: Diagnosis not present

## 2019-06-08 DIAGNOSIS — E785 Hyperlipidemia, unspecified: Secondary | ICD-10-CM | POA: Diagnosis not present

## 2019-06-08 DIAGNOSIS — Z0000 Encounter for general adult medical examination without abnormal findings: Secondary | ICD-10-CM | POA: Diagnosis not present

## 2019-06-08 DIAGNOSIS — T2120XA Burn of second degree of trunk, unspecified site, initial encounter: Secondary | ICD-10-CM | POA: Diagnosis not present

## 2019-06-13 DIAGNOSIS — T2120XA Burn of second degree of trunk, unspecified site, initial encounter: Secondary | ICD-10-CM | POA: Diagnosis not present

## 2019-06-19 DIAGNOSIS — K5900 Constipation, unspecified: Secondary | ICD-10-CM | POA: Diagnosis not present

## 2019-06-19 DIAGNOSIS — I2510 Atherosclerotic heart disease of native coronary artery without angina pectoris: Secondary | ICD-10-CM | POA: Diagnosis not present

## 2019-06-19 DIAGNOSIS — T2120XA Burn of second degree of trunk, unspecified site, initial encounter: Secondary | ICD-10-CM | POA: Diagnosis not present

## 2019-06-27 DIAGNOSIS — K5900 Constipation, unspecified: Secondary | ICD-10-CM | POA: Diagnosis not present

## 2019-06-27 DIAGNOSIS — L299 Pruritus, unspecified: Secondary | ICD-10-CM | POA: Diagnosis not present

## 2019-06-27 DIAGNOSIS — T2120XA Burn of second degree of trunk, unspecified site, initial encounter: Secondary | ICD-10-CM | POA: Diagnosis not present

## 2019-07-04 DIAGNOSIS — L299 Pruritus, unspecified: Secondary | ICD-10-CM | POA: Diagnosis not present

## 2019-07-04 DIAGNOSIS — Z6824 Body mass index (BMI) 24.0-24.9, adult: Secondary | ICD-10-CM | POA: Diagnosis not present

## 2019-07-04 DIAGNOSIS — T2120XA Burn of second degree of trunk, unspecified site, initial encounter: Secondary | ICD-10-CM | POA: Diagnosis not present

## 2019-07-20 DIAGNOSIS — E782 Mixed hyperlipidemia: Secondary | ICD-10-CM | POA: Diagnosis not present

## 2019-07-20 DIAGNOSIS — E119 Type 2 diabetes mellitus without complications: Secondary | ICD-10-CM | POA: Diagnosis not present

## 2019-07-20 DIAGNOSIS — I2510 Atherosclerotic heart disease of native coronary artery without angina pectoris: Secondary | ICD-10-CM | POA: Diagnosis not present

## 2019-07-20 DIAGNOSIS — T2120XA Burn of second degree of trunk, unspecified site, initial encounter: Secondary | ICD-10-CM | POA: Diagnosis not present

## 2019-08-03 DIAGNOSIS — T2120XA Burn of second degree of trunk, unspecified site, initial encounter: Secondary | ICD-10-CM | POA: Diagnosis not present

## 2019-08-03 DIAGNOSIS — Z6824 Body mass index (BMI) 24.0-24.9, adult: Secondary | ICD-10-CM | POA: Diagnosis not present

## 2019-08-24 DIAGNOSIS — T2120XA Burn of second degree of trunk, unspecified site, initial encounter: Secondary | ICD-10-CM | POA: Diagnosis not present

## 2019-08-24 DIAGNOSIS — R600 Localized edema: Secondary | ICD-10-CM | POA: Diagnosis not present

## 2019-09-14 DIAGNOSIS — L299 Pruritus, unspecified: Secondary | ICD-10-CM | POA: Diagnosis not present

## 2019-09-14 DIAGNOSIS — Z6824 Body mass index (BMI) 24.0-24.9, adult: Secondary | ICD-10-CM | POA: Diagnosis not present

## 2019-09-14 DIAGNOSIS — T2120XA Burn of second degree of trunk, unspecified site, initial encounter: Secondary | ICD-10-CM | POA: Diagnosis not present

## 2019-09-23 IMAGING — CT CT HEAD WITHOUT CONTRAST
3 series · 14 of 47 positions shown, 16 images · non-contrast
Comparison: 06/23/2018.

CLINICAL DATA: 84-year-old male with large bilateral subdural
hematomas discovered after head trauma on anticoagulation.

EXAM:
CT HEAD WITHOUT CONTRAST
TECHNIQUE: Contiguous axial images were obtained from the base of the skull
through the vertex without intravenous contrast.

[Series 3: head 5.0 h30s · axial · 0.49mm/px · z∈[-39,+101]mm · 8 of 34 slices shown, 10 images]
[im 3/34  brain]
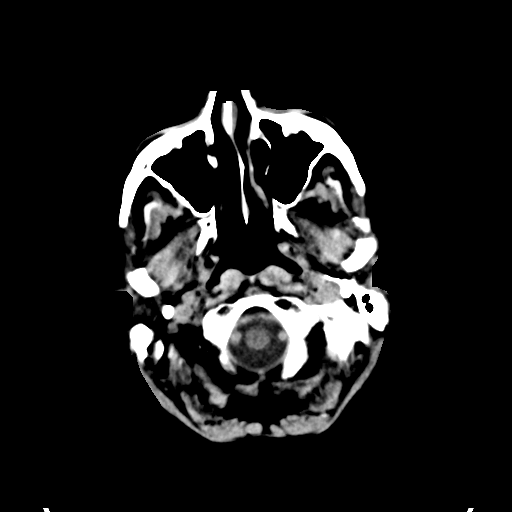
[im 3/34  bone]
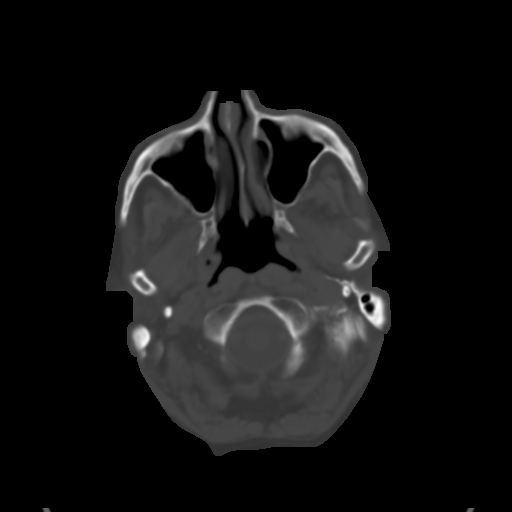
[im 7/34  brain]
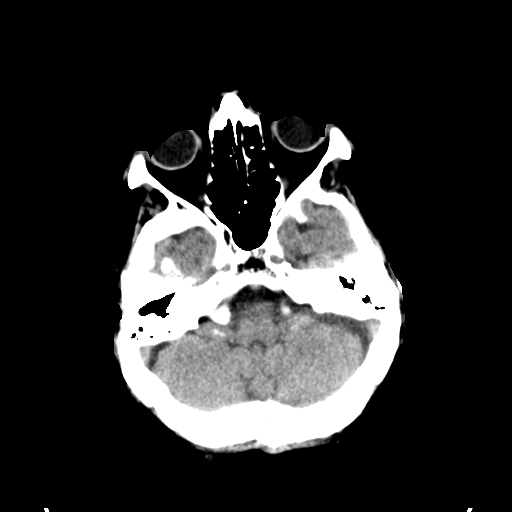
[im 11/34  brain]
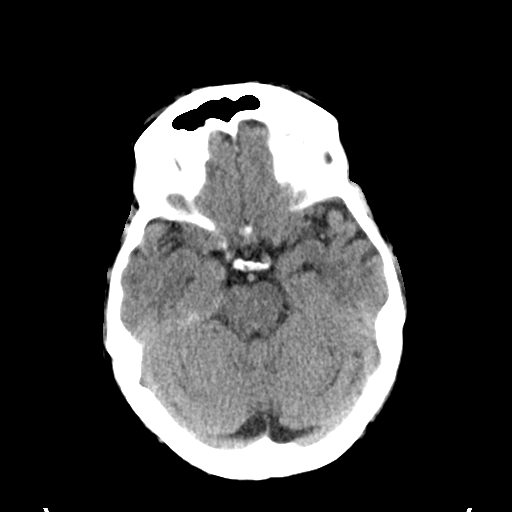
[im 15/34  brain]
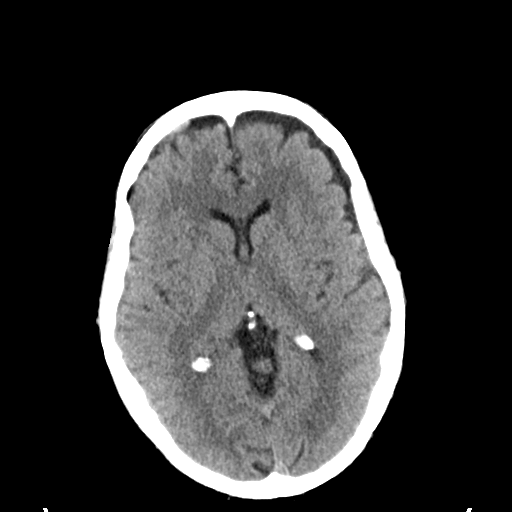
[im 19/34  brain]
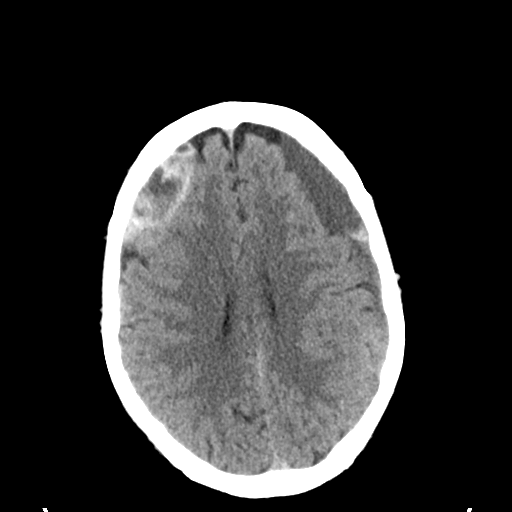
[im 19/34  bone]
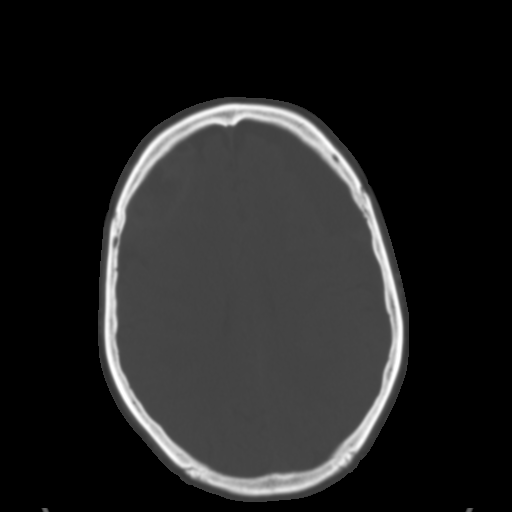
[im 23/34  brain]
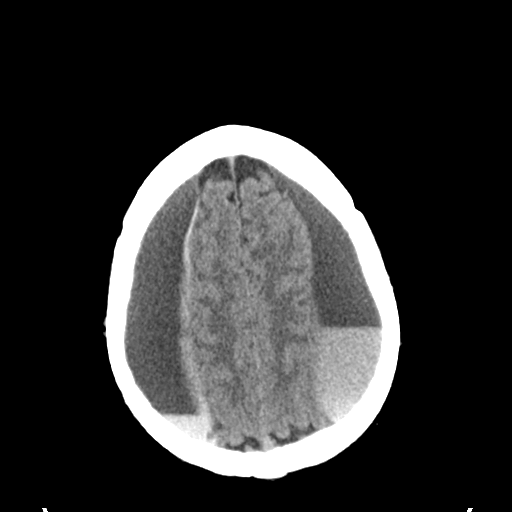
[im 27/34  brain]
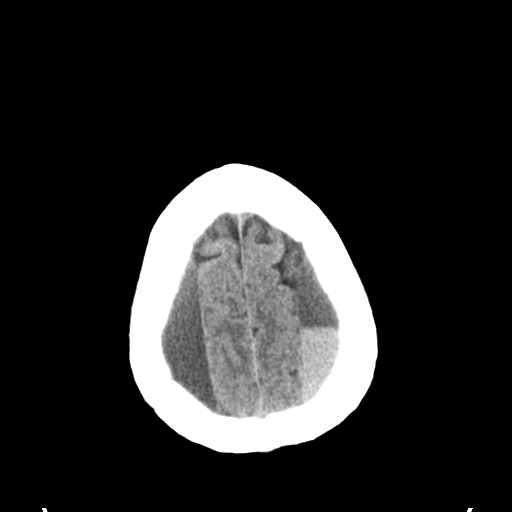
[im 31/34  brain]
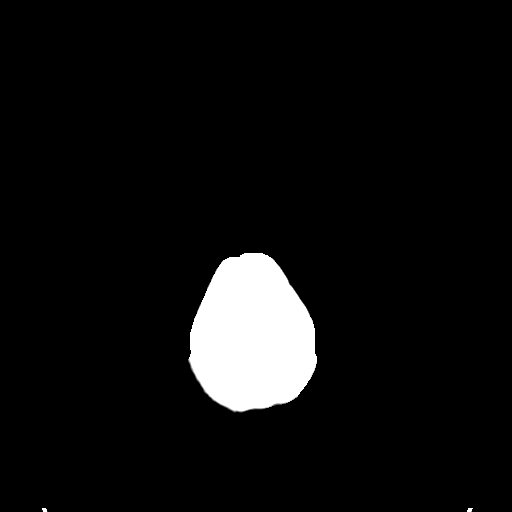

[Series 5: head 3.0 mpr cor · coronal · 0.36mm/px · 3 of 81 slices shown]
[im 27/81  brain]
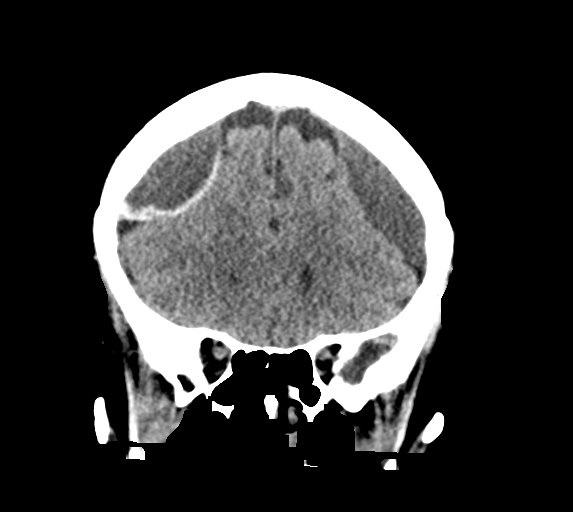
[im 36/81  brain]
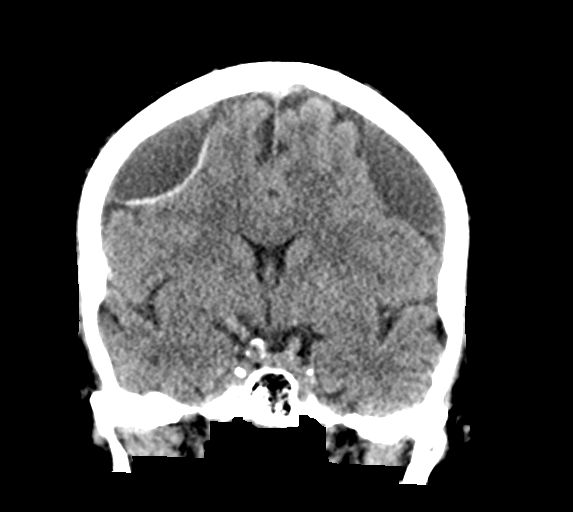
[im 45/81  brain]
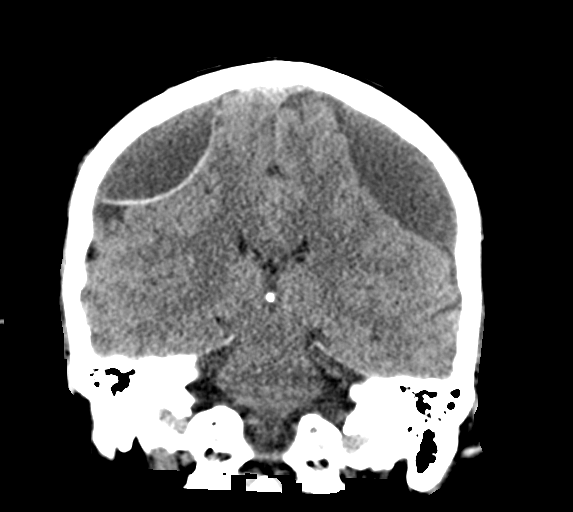

[Series 6: head 3.0 mpr sag · sagittal · 0.33mm/px · 3 of 67 slices shown]
[im 23/67  brain]
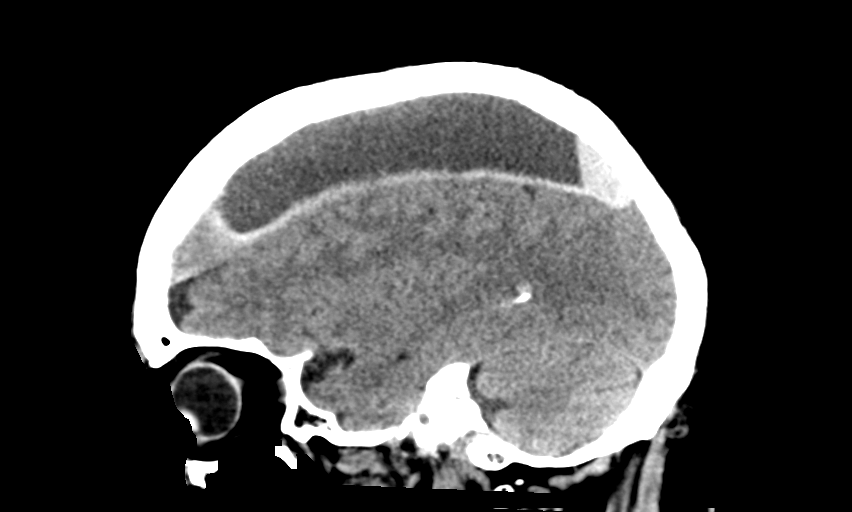
[im 34/67  brain]
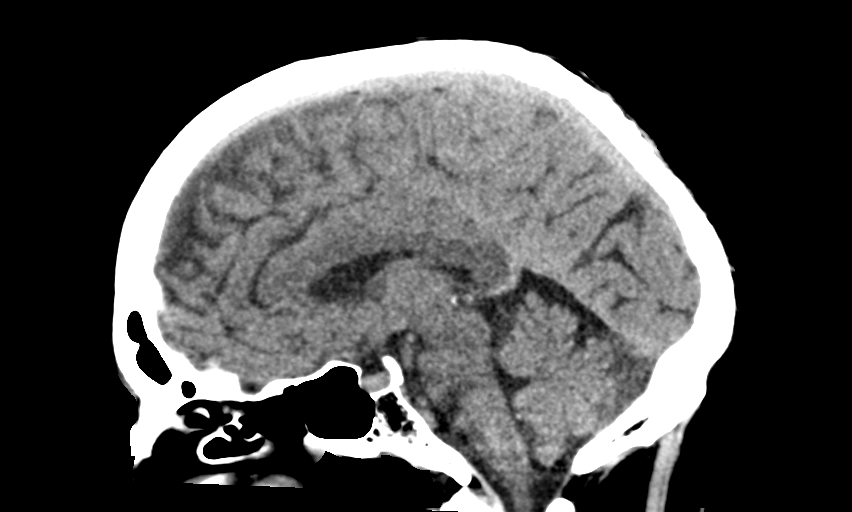
[im 45/67  brain]
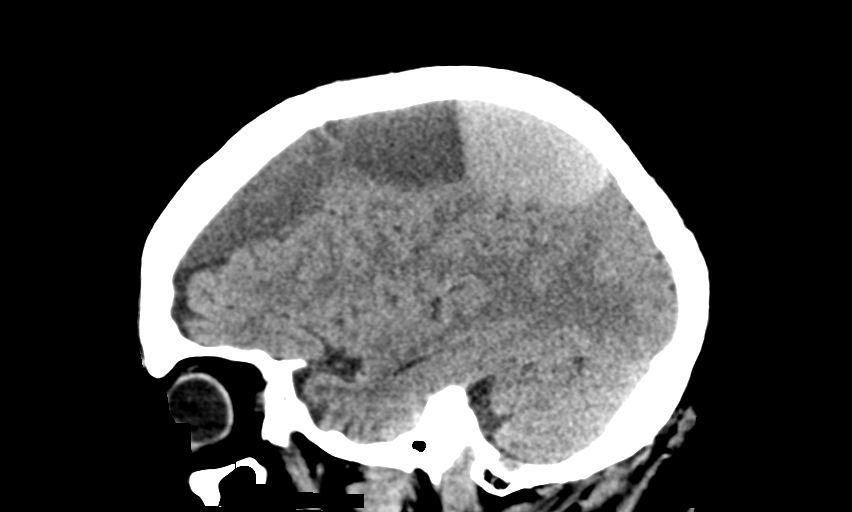

[14 of 47 positions shown; findings below may reference images not displayed]

FINDINGS: Brain: Mixed density bilateral superior convexity extra-axial
hematomas are biconvex, but subdural hematoma is suspected. Layering
hematocrit bilaterally. Right frontal convexity hematoma measures up
to 25 millimeters in thickness. Left convexity hematoma measures up
to 26 millimeters. Associated frontal and parietal mass effect.
Trace rightward midline shift (coronal image 36). Basilar cisterns
remain normal.

No other intracranial hemorrhage identified. Gray-white matter
differentiation is within normal limits throughout the brain. No
cortically based acute infarct identified. No ventriculomegaly.

Vascular: Calcified atherosclerosis at the skull base.

Skull: No fracture identified.

Sinuses/Orbits: Visualized paranasal sinuses and mastoids are stable
and well pneumatized.

Other: Negative orbits and scalp soft tissues.
IMPRESSION: 1. Bilateral superior convexity mixed density biconvex extra-axial
hemorrhage most compatible with subdural hematomas. Thickness up to
25 mm on the right and 26 mm on the left.
Mass-effect primarily on the superior frontal and parietal
convexities with trace rightward midline shift.
Preserved basilar cisterns.  No ventriculomegaly.
2. No skull fracture or other acute intracranial abnormality
identified.

## 2019-10-12 ENCOUNTER — Other Ambulatory Visit: Payer: Self-pay | Admitting: Interventional Cardiology

## 2019-10-12 DIAGNOSIS — Z6824 Body mass index (BMI) 24.0-24.9, adult: Secondary | ICD-10-CM | POA: Diagnosis not present

## 2019-10-12 DIAGNOSIS — L299 Pruritus, unspecified: Secondary | ICD-10-CM | POA: Diagnosis not present

## 2019-10-12 DIAGNOSIS — T2120XA Burn of second degree of trunk, unspecified site, initial encounter: Secondary | ICD-10-CM | POA: Diagnosis not present

## 2019-11-15 DIAGNOSIS — E119 Type 2 diabetes mellitus without complications: Secondary | ICD-10-CM | POA: Diagnosis not present

## 2019-11-15 DIAGNOSIS — I2510 Atherosclerotic heart disease of native coronary artery without angina pectoris: Secondary | ICD-10-CM | POA: Diagnosis not present

## 2019-11-15 DIAGNOSIS — T2120XA Burn of second degree of trunk, unspecified site, initial encounter: Secondary | ICD-10-CM | POA: Diagnosis not present

## 2019-11-15 DIAGNOSIS — E782 Mixed hyperlipidemia: Secondary | ICD-10-CM | POA: Diagnosis not present

## 2019-11-15 DIAGNOSIS — Z79899 Other long term (current) drug therapy: Secondary | ICD-10-CM | POA: Diagnosis not present

## 2019-12-09 DIAGNOSIS — Z20822 Contact with and (suspected) exposure to covid-19: Secondary | ICD-10-CM | POA: Diagnosis not present

## 2019-12-09 DIAGNOSIS — U071 COVID-19: Secondary | ICD-10-CM | POA: Diagnosis not present

## 2019-12-10 ENCOUNTER — Other Ambulatory Visit: Payer: Self-pay | Admitting: Physician Assistant

## 2019-12-10 ENCOUNTER — Ambulatory Visit (HOSPITAL_COMMUNITY)
Admission: RE | Admit: 2019-12-10 | Discharge: 2019-12-10 | Disposition: A | Discharge status: Home / Self Care | Payer: Medicare Other | Source: Ambulatory Visit | Attending: Pulmonary Disease | Admitting: Pulmonary Disease

## 2019-12-10 DIAGNOSIS — U071 COVID-19: Secondary | ICD-10-CM | POA: Insufficient documentation

## 2019-12-10 DIAGNOSIS — I10 Essential (primary) hypertension: Secondary | ICD-10-CM | POA: Diagnosis present

## 2019-12-10 DIAGNOSIS — Z23 Encounter for immunization: Secondary | ICD-10-CM | POA: Diagnosis not present

## 2019-12-10 DIAGNOSIS — Z720 Tobacco use: Secondary | ICD-10-CM

## 2019-12-10 MED ORDER — SODIUM CHLORIDE 0.9 % IV SOLN: INTRAVENOUS | Status: DC | PRN

## 2019-12-10 MED ORDER — DIPHENHYDRAMINE HCL 50 MG/ML IJ SOLN: 50.0000 mg | Freq: Once | INTRAMUSCULAR | Status: DC | PRN

## 2019-12-10 MED ORDER — SODIUM CHLORIDE 0.9 % IV SOLN
1200.0000 mg | Freq: Once | INTRAVENOUS | Status: AC
  Administered 2019-12-10: 1200 mg via INTRAVENOUS
  Filled 2019-12-10: qty 10

## 2019-12-10 MED ORDER — FAMOTIDINE IN NACL 20-0.9 MG/50ML-% IV SOLN: 20.0000 mg | Freq: Once | INTRAVENOUS | Status: DC | PRN

## 2019-12-10 MED ORDER — EPINEPHRINE 0.3 MG/0.3ML IJ SOAJ: 0.3000 mg | Freq: Once | INTRAMUSCULAR | Status: DC | PRN

## 2019-12-10 MED ORDER — METHYLPREDNISOLONE SODIUM SUCC 125 MG IJ SOLR: 125.0000 mg | Freq: Once | INTRAMUSCULAR | Status: DC | PRN

## 2019-12-10 MED ORDER — ALBUTEROL SULFATE HFA 108 (90 BASE) MCG/ACT IN AERS: 2.0000 | INHALATION_SPRAY | Freq: Once | RESPIRATORY_TRACT | Status: DC | PRN

## 2019-12-10 NOTE — Progress Notes (Signed)
  Diagnosis: COVID-19  Physician: Dr. Wright  Procedure: [facility] Med: casirivimab\imdevimab infusion - Provided patient with casirivimab\imdevimab fact sheet for patients, parents and caregivers prior to infusion.  Complications: No immediate complications noted.  Discharge: Discharged home   Craig Ressler  [name] 12/10/2019   

## 2019-12-10 NOTE — Discharge Instructions (Signed)

## 2019-12-10 NOTE — Progress Notes (Signed)
I connected by phone with Craig [name] on 12/10/2019 at 8:34 AM to discuss the potential use of a new treatment for mild to moderate COVID-19 viral infection in non-hospitalized patients.  This patient is a 85 y.o. male that meets the FDA criteria for Emergency Use Authorization of COVID monoclonal antibody casirivimab/imdevimab.  Has a (+) direct SARS-CoV-2 viral test result  Has mild or moderate COVID-19   Is NOT hospitalized due to COVID-19  Is within 10 days of symptom onset  Has at least one of the high risk factor(s) for progression to severe COVID-19 and/or hospitalization as defined in EUA.  Specific high risk criteria : Older age (>/= 83 yo), BMI > 25, Immunosuppressive Disease or Treatment and Chronic Lung Disease   I have spoken and communicated the following to the patient or parent/caregiver regarding COVID monoclonal antibody treatment:  1. FDA has authorized the emergency use for the treatment of mild to moderate COVID-19 in adults and pediatric patients with positive results of direct SARS-CoV-2 viral testing who are 11 years of age and older weighing at least 40 kg, and who are at high risk for progressing to severe COVID-19 and/or hospitalization.  2. The significant known and potential risks and benefits of COVID monoclonal antibody, and the extent to which such potential risks and benefits are unknown.  3. Information on available alternative treatments and the risks and benefits of those alternatives, including clinical trials.  4. Patients treated with COVID monoclonal antibody should continue to self-isolate and use infection control measures (e.g., wear mask, isolate, social distance, avoid sharing personal items, clean and disinfect "high touch" surfaces, and frequent handwashing) according to CDC guidelines.   5. The patient or parent/caregiver has the option to accept or refuse COVID monoclonal antibody treatment.  After reviewing this information with the  patient, The patient agreed to proceed with receiving casirivimab\imdevimab infusion and will be provided a copy of the Fact sheet prior to receiving the infusion.  Sx onset 9/3. Set up for infusion on 9/5 @ 9:30am. Directions given to [facility] Pt is aware that insurance will be charged an infusion fee. Pt is not vaccinated.   Cline Crock 12/10/2019 8:34 AM

## 2019-12-28 DIAGNOSIS — T2120XA Burn of second degree of trunk, unspecified site, initial encounter: Secondary | ICD-10-CM | POA: Diagnosis not present

## 2019-12-29 ENCOUNTER — Telehealth: Payer: Self-pay | Admitting: Interventional Cardiology

## 2019-12-29 ENCOUNTER — Other Ambulatory Visit: Payer: Self-pay

## 2019-12-29 MED ORDER — PRALUENT 75 MG/ML ~~LOC~~ SOAJ: 1.0000 "pen " | SUBCUTANEOUS | 11 refills | Status: DC

## 2019-12-29 NOTE — Telephone Encounter (Signed)
Refill already sent in. 

## 2019-12-29 NOTE — Telephone Encounter (Signed)
*  STAT* If patient is at the pharmacy, call can be transferred to refill team.   1. Which medications need to be refilled? (please list name of each medication and dose if known) Praluent  2. Which pharmacy/location (including street and city if local pharmacy) is medication to be sent to? [facility] [phone]  3. Do they need a 30 day or 90 day supply? Not sure

## 2020-01-11 ENCOUNTER — Telehealth: Payer: Self-pay | Admitting: Interventional Cardiology

## 2020-01-11 NOTE — Telephone Encounter (Signed)
Sade from [facility] calling for the diagnosis code for the patient's praluent prescription. She states they are also missing the stand alone prescription. She states it must be physically signed and dated with the primary diagnosis code. She states to call [phone] Option 1. She states it can be faxed or uploaded to their provider portal at [email or web address]. She states they are open 8am-8pm Monday - Friday. Fax: [phone]

## 2020-01-11 NOTE — Telephone Encounter (Signed)
I returned the call to [facility] and they stated that they are missing the page w/the rx and diagnosis code. Will route to chst pharmacist so that they can print the form for the doctor to sign and complete that section.

## 2020-01-12 NOTE — Telephone Encounter (Signed)
Form completed and faxed to [employer] patient assistance

## 2020-01-16 NOTE — Telephone Encounter (Signed)
Melissa, chris faxed the form I can't fix it from [place] it will need dr smith's signature. Will route to chst pharmacist team as I cannot print the form and take to the doctor from offsite

## 2020-01-16 NOTE — Telephone Encounter (Signed)
Victorino Dike is calling stating she spoke the patient assistance the fax was sent to and they advised her they received an outdated form and are needing further information. She states they requested our office call them and speak to [place] the contact number is [phone] option 1. Please advise.

## 2020-01-17 NOTE — Telephone Encounter (Signed)
Haleigh can you please contact the [employer]raluent patient assistance program and have them send you the form we need since the one on their website is apparently not updated?

## 2020-01-17 NOTE — Telephone Encounter (Signed)
Called [facility] to get them to fax the updated form so that we can get the pt their medication

## 2020-01-22 NOTE — Telephone Encounter (Signed)
Rx page of [employer] application was filled out again and faxed on 10/15.  Called [employer] for update on 10/18 and was advised that pt was approved for Praluent assistance through 04/05/20 and that they had left him a message regarding this.  Called pt to let him know this. He was very confused, states he didn't know anything about the application and that he stopped Praluent 2 months ago because he didn't feel any better when he was taking it. Advised pt that Praluent still provides cardiovascular benefit and lowers his LDL cholesterol and that typically patients do not feel any different when they take it.   Pt states he's seeing his PCP this Friday and wanted to ask them about the medication. Advised him that Dr Katrinka Blazing is the provider who started him on Praluent last year and wants him to continue on therapy since his LDL responded well and improved to 41 on therapy. Pt eventually seemed agreeable to restarting Praluent. Provided him with phone #[phone] who will coordinate Praluent delivery.

## 2020-01-26 DIAGNOSIS — Z6824 Body mass index (BMI) 24.0-24.9, adult: Secondary | ICD-10-CM | POA: Diagnosis not present

## 2020-01-26 DIAGNOSIS — E119 Type 2 diabetes mellitus without complications: Secondary | ICD-10-CM | POA: Diagnosis not present

## 2020-01-26 DIAGNOSIS — T2120XA Burn of second degree of trunk, unspecified site, initial encounter: Secondary | ICD-10-CM | POA: Diagnosis not present

## 2020-03-07 DIAGNOSIS — L219 Seborrheic dermatitis, unspecified: Secondary | ICD-10-CM | POA: Diagnosis not present

## 2020-03-07 DIAGNOSIS — Z6824 Body mass index (BMI) 24.0-24.9, adult: Secondary | ICD-10-CM | POA: Diagnosis not present

## 2020-03-07 DIAGNOSIS — T2120XA Burn of second degree of trunk, unspecified site, initial encounter: Secondary | ICD-10-CM | POA: Diagnosis not present

## 2020-04-09 DIAGNOSIS — T2120XA Burn of second degree of trunk, unspecified site, initial encounter: Secondary | ICD-10-CM | POA: Diagnosis not present

## 2020-04-09 DIAGNOSIS — S069X0S Unspecified intracranial injury without loss of consciousness, sequela: Secondary | ICD-10-CM | POA: Diagnosis not present

## 2020-04-09 DIAGNOSIS — E782 Mixed hyperlipidemia: Secondary | ICD-10-CM | POA: Diagnosis not present

## 2020-04-09 DIAGNOSIS — I2510 Atherosclerotic heart disease of native coronary artery without angina pectoris: Secondary | ICD-10-CM | POA: Diagnosis not present

## 2020-04-09 DIAGNOSIS — E119 Type 2 diabetes mellitus without complications: Secondary | ICD-10-CM | POA: Diagnosis not present

## 2020-04-15 ENCOUNTER — Telehealth: Payer: Self-pay | Admitting: Interventional Cardiology

## 2020-04-15 NOTE — Telephone Encounter (Signed)
Called and spoke w/pt's daughter about starting an application for the grant but she lacked some crucial info for applying gave her the office callback number so she can gather the details and contact us.

## 2020-04-15 NOTE — Telephone Encounter (Signed)
Craig [name] will you please get patient enrolled in the [employer]

## 2020-04-15 NOTE — Telephone Encounter (Signed)
Pt c/o medication issue:  1. Name of Medication: Alirocumab (PRALUENT) 75 MG/ML SOAJ  2. How are you currently taking this medication (dosage and times per day)? Injection every 2 weeks  3. Are you having a reaction (difficulty breathing--STAT)? no  4. What is your medication issue? Patient's daughter would like to know if there is a program for the patient to help him pay for the medication.

## 2020-06-13 DIAGNOSIS — S069X0S Unspecified intracranial injury without loss of consciousness, sequela: Secondary | ICD-10-CM | POA: Diagnosis not present

## 2020-06-13 DIAGNOSIS — Z9181 History of falling: Secondary | ICD-10-CM | POA: Diagnosis not present

## 2020-06-13 DIAGNOSIS — N183 Chronic kidney disease, stage 3 unspecified: Secondary | ICD-10-CM | POA: Diagnosis not present

## 2020-06-13 DIAGNOSIS — E119 Type 2 diabetes mellitus without complications: Secondary | ICD-10-CM | POA: Diagnosis not present

## 2020-06-13 DIAGNOSIS — T2120XA Burn of second degree of trunk, unspecified site, initial encounter: Secondary | ICD-10-CM | POA: Diagnosis not present

## 2020-06-13 DIAGNOSIS — Z6824 Body mass index (BMI) 24.0-24.9, adult: Secondary | ICD-10-CM | POA: Diagnosis not present

## 2020-06-13 DIAGNOSIS — E782 Mixed hyperlipidemia: Secondary | ICD-10-CM | POA: Diagnosis not present

## 2020-06-13 DIAGNOSIS — I2510 Atherosclerotic heart disease of native coronary artery without angina pectoris: Secondary | ICD-10-CM | POA: Diagnosis not present

## 2020-07-11 DIAGNOSIS — Z0000 Encounter for general adult medical examination without abnormal findings: Secondary | ICD-10-CM | POA: Diagnosis not present

## 2020-07-11 DIAGNOSIS — Z9181 History of falling: Secondary | ICD-10-CM | POA: Diagnosis not present

## 2020-07-11 DIAGNOSIS — E785 Hyperlipidemia, unspecified: Secondary | ICD-10-CM | POA: Diagnosis not present

## 2020-09-12 DIAGNOSIS — N183 Chronic kidney disease, stage 3 unspecified: Secondary | ICD-10-CM | POA: Diagnosis not present

## 2020-09-12 DIAGNOSIS — Z6824 Body mass index (BMI) 24.0-24.9, adult: Secondary | ICD-10-CM | POA: Diagnosis not present

## 2020-09-12 DIAGNOSIS — S069X0S Unspecified intracranial injury without loss of consciousness, sequela: Secondary | ICD-10-CM | POA: Diagnosis not present

## 2020-09-12 DIAGNOSIS — E119 Type 2 diabetes mellitus without complications: Secondary | ICD-10-CM | POA: Diagnosis not present

## 2020-09-12 DIAGNOSIS — T2120XA Burn of second degree of trunk, unspecified site, initial encounter: Secondary | ICD-10-CM | POA: Diagnosis not present

## 2020-09-12 DIAGNOSIS — Z79899 Other long term (current) drug therapy: Secondary | ICD-10-CM | POA: Diagnosis not present

## 2020-09-12 DIAGNOSIS — Z139 Encounter for screening, unspecified: Secondary | ICD-10-CM | POA: Diagnosis not present

## 2020-09-12 DIAGNOSIS — I2510 Atherosclerotic heart disease of native coronary artery without angina pectoris: Secondary | ICD-10-CM | POA: Diagnosis not present

## 2020-11-01 ENCOUNTER — Other Ambulatory Visit: Payer: Self-pay | Admitting: Interventional Cardiology

## 2020-11-01 DIAGNOSIS — I2119 ST elevation (STEMI) myocardial infarction involving other coronary artery of inferior wall: Secondary | ICD-10-CM

## 2020-11-06 ENCOUNTER — Other Ambulatory Visit: Payer: Self-pay | Admitting: Pharmacist

## 2020-11-06 DIAGNOSIS — I2119 ST elevation (STEMI) myocardial infarction involving other coronary artery of inferior wall: Secondary | ICD-10-CM

## 2020-11-06 NOTE — Telephone Encounter (Signed)
Duplicate

## 2020-12-07 ENCOUNTER — Other Ambulatory Visit: Payer: Self-pay | Admitting: Interventional Cardiology

## 2020-12-07 DIAGNOSIS — I2119 ST elevation (STEMI) myocardial infarction involving other coronary artery of inferior wall: Secondary | ICD-10-CM

## 2020-12-19 DIAGNOSIS — E119 Type 2 diabetes mellitus without complications: Secondary | ICD-10-CM | POA: Diagnosis not present

## 2020-12-19 DIAGNOSIS — Z6824 Body mass index (BMI) 24.0-24.9, adult: Secondary | ICD-10-CM | POA: Diagnosis not present

## 2020-12-19 DIAGNOSIS — I2510 Atherosclerotic heart disease of native coronary artery without angina pectoris: Secondary | ICD-10-CM | POA: Diagnosis not present

## 2020-12-19 DIAGNOSIS — N183 Chronic kidney disease, stage 3 unspecified: Secondary | ICD-10-CM | POA: Diagnosis not present

## 2020-12-19 DIAGNOSIS — T2120XA Burn of second degree of trunk, unspecified site, initial encounter: Secondary | ICD-10-CM | POA: Diagnosis not present

## 2020-12-19 DIAGNOSIS — S069X0S Unspecified intracranial injury without loss of consciousness, sequela: Secondary | ICD-10-CM | POA: Diagnosis not present

## 2021-01-09 ENCOUNTER — Other Ambulatory Visit: Payer: Self-pay | Admitting: Student in an Organized Health Care Education/Training Program

## 2021-01-09 ENCOUNTER — Ambulatory Visit
Payer: Medicare Other | Attending: Student in an Organized Health Care Education/Training Program | Admitting: Student in an Organized Health Care Education/Training Program

## 2021-01-09 ENCOUNTER — Other Ambulatory Visit: Payer: Self-pay

## 2021-01-09 ENCOUNTER — Encounter: Payer: Self-pay | Admitting: Student in an Organized Health Care Education/Training Program

## 2021-01-09 VITALS — BP 142/69 | HR 81 | Temp 97.2°F | Resp 16 | Ht 69.0 in | Wt 169.5 lb

## 2021-01-09 DIAGNOSIS — G8929 Other chronic pain: Secondary | ICD-10-CM

## 2021-01-09 DIAGNOSIS — M546 Pain in thoracic spine: Secondary | ICD-10-CM

## 2021-01-09 DIAGNOSIS — T3111 Burns involving 10-19% of body surface with 10-19% third degree burns: Secondary | ICD-10-CM | POA: Insufficient documentation

## 2021-01-09 DIAGNOSIS — M792 Neuralgia and neuritis, unspecified: Secondary | ICD-10-CM | POA: Diagnosis not present

## 2021-01-09 DIAGNOSIS — G894 Chronic pain syndrome: Secondary | ICD-10-CM | POA: Diagnosis not present

## 2021-01-09 MED ORDER — GABAPENTIN 300 MG PO CAPS: 300.0000 mg | ORAL_CAPSULE | Freq: Three times a day (TID) | ORAL | 1 refills | Status: DC
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
## 2021-01-09 NOTE — Progress Notes (Signed)
Safety precautions to be maintained throughout the outpatient stay will include: orient to surroundings, keep bed in low position, maintain call bell within reach at all times, provide assistance with transfer out of bed and ambulation.  

## 2021-01-15 LAB — COMPLIANCE DRUG ANALYSIS, UR

## 2021-01-20 ENCOUNTER — Encounter: Payer: Self-pay | Admitting: Student in an Organized Health Care Education/Training Program

## 2021-01-20 ENCOUNTER — Other Ambulatory Visit: Payer: Self-pay

## 2021-01-20 ENCOUNTER — Ambulatory Visit
Payer: Medicare Other | Attending: Student in an Organized Health Care Education/Training Program | Admitting: Student in an Organized Health Care Education/Training Program

## 2021-01-20 VITALS — BP 130/83 | HR 80 | Temp 97.4°F | Resp 15 | Ht 68.0 in | Wt 169.0 lb

## 2021-01-20 DIAGNOSIS — G8929 Other chronic pain: Secondary | ICD-10-CM | POA: Insufficient documentation

## 2021-01-20 DIAGNOSIS — T3111 Burns involving 10-19% of body surface with 10-19% third degree burns: Secondary | ICD-10-CM | POA: Diagnosis not present

## 2021-01-20 DIAGNOSIS — Z0289 Encounter for other administrative examinations: Secondary | ICD-10-CM | POA: Insufficient documentation

## 2021-01-20 DIAGNOSIS — M792 Neuralgia and neuritis, unspecified: Secondary | ICD-10-CM | POA: Diagnosis not present

## 2021-01-20 DIAGNOSIS — Z79891 Long term (current) use of opiate analgesic: Secondary | ICD-10-CM | POA: Insufficient documentation

## 2021-01-20 DIAGNOSIS — M546 Pain in thoracic spine: Secondary | ICD-10-CM | POA: Diagnosis not present

## 2021-01-20 MED ORDER — TRAMADOL HCL ER 100 MG PO TB24: 100.0000 mg | ORAL_TABLET | Freq: Every day | ORAL | 1 refills | Status: DC

## 2021-01-20 MED ORDER — TRAMADOL HCL 50 MG PO TABS: 50.0000 mg | ORAL_TABLET | Freq: Two times a day (BID) | ORAL | 1 refills | Status: DC | PRN

## 2021-01-20 MED ORDER — GABAPENTIN 100 MG PO CAPS: 100.0000 mg | ORAL_CAPSULE | Freq: Three times a day (TID) | ORAL | 5 refills | Status: DC
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
## 2021-01-20 NOTE — Progress Notes (Signed)
Safety precautions to be maintained throughout the outpatient stay will include: orient to surroundings, keep bed in low position, maintain call bell within reach at all times, provide assistance with transfer out of bed and ambulation.  

## 2021-01-21 ENCOUNTER — Encounter: Payer: Medicare Other | Attending: Physician Assistant | Admitting: Physician Assistant

## 2021-01-21 DIAGNOSIS — I252 Old myocardial infarction: Secondary | ICD-10-CM | POA: Diagnosis not present

## 2021-01-21 DIAGNOSIS — X58XXXA Exposure to other specified factors, initial encounter: Secondary | ICD-10-CM | POA: Insufficient documentation

## 2021-01-21 DIAGNOSIS — T2134XA Burn of third degree of lower back, initial encounter: Secondary | ICD-10-CM | POA: Insufficient documentation

## 2021-01-21 DIAGNOSIS — E119 Type 2 diabetes mellitus without complications: Secondary | ICD-10-CM | POA: Diagnosis not present

## 2021-01-21 DIAGNOSIS — I2510 Atherosclerotic heart disease of native coronary artery without angina pectoris: Secondary | ICD-10-CM | POA: Diagnosis not present

## 2021-01-21 DIAGNOSIS — T2133XA Burn of third degree of upper back, initial encounter: Secondary | ICD-10-CM | POA: Insufficient documentation
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
## 2021-01-21 NOTE — Progress Notes (Signed)
Craig [name], Craig [name] ([id number]) Visit Report for 01/21/2021 Abuse/Suicide Risk Screen Details Patient Name: Craig [name], Craig [name]. Date of Service: 01/21/2021 10:15 AM Medical Record Number: [id number] Patient Account Number: [id number] Date of Birth/Sex: May 28, 1934 (86 y.o. M) Treating RN: Huel Coventry Primary Care Lequisha Cammack: Lonie Peak Other Clinician: Referring Clatie Kessen: Edward Jolly Treating Jeny Nield/Extender: Rowan Blase in Treatment: 0 Abuse/Suicide Risk Screen Items Answer ABUSE RISK SCREEN: Has anyone close to you tried to hurt or harm you recentlyo No Do you feel uncomfortable with anyone in your familyo No Has anyone forced you do things that you didnot want to doo No Electronic Signature(s) Signed: 01/21/2021 5:32:40 PM By: Elliot Gurney, BSN, RN, CWS, Kim RN, BSN Entered By: Elliot Gurney, BSN, RN, CWS, Kim on 01/21/2021 10:32:23 Craig [name] ([id number]) -------------------------------------------------------------------------------- Activities of Daily Living Details Patient Name: Craig [name], Craig [name]. Date of Service: 01/21/2021 10:15 AM Medical Record Number: [id number] Patient Account Number: [id number] Date of Birth/Sex: November 15, 1934 (86 y.o. M) Treating RN: Huel Coventry Primary Care Burlene Montecalvo: Lonie Peak Other Clinician: Referring Praveen Coia: Edward Jolly Treating Jayliana Valencia/Extender: Rowan Blase in Treatment: 0 Activities of Daily Living Items Answer Activities of Daily Living (Please select one for each item) Drive Automobile Completely Able Take Medications Completely Able Use Telephone Completely Able Care for Appearance Completely Able Use Toilet Completely Able Bath / Shower Completely Able Dress Self Completely Able Feed Self Completely Able Walk Completely Able Get In / Out Bed Completely Able Housework Completely Able Prepare Meals Completely Able Handle Money Completely Able Shop for Self Completely Able Electronic Signature(s) Signed:  01/21/2021 5:32:40 PM By: Elliot Gurney, BSN, RN, CWS, Kim RN, BSN Entered By: Elliot Gurney, BSN, RN, CWS, Kim on 01/21/2021 10:32:45 Craig [name] ([id number]) -------------------------------------------------------------------------------- Education Screening Details Patient Name: Craig [name]. Date of Service: 01/21/2021 10:15 AM Medical Record Number: [id number] Patient Account Number: [id number] Date of Birth/Sex: [birth year] (86 y.o. M) Treating RN: Huel Coventry Primary Care Caidan Hubbert: Lonie Peak Other Clinician: Referring Nimco Bivens: Edward Jolly Treating Tanzania Basham/Extender: Rowan Blase in Treatment: 0 Primary Learner Assessed: Patient Learning Preferences/Education Level/Primary Language Learning Preference: Explanation, Demonstration Highest Education Level: High School Preferred Language: English Cognitive Barrier Language Barrier: No Translator Needed: No Memory Deficit: No Emotional Barrier: No Physical Barrier Impaired Vision: No Impaired Hearing: No Decreased Hand dexterity: No Knowledge/Comprehension Knowledge Level: High Comprehension Level: High Ability to understand written instructions: High Ability to understand verbal instructions: High Motivation Anxiety Level: Calm Cooperation: Cooperative Education Importance: Acknowledges Need Interest in Health Problems: Asks Questions Perception: Coherent Willingness to Engage in Self-Management High Activities: Readiness to Engage in Self-Management High Activities: Electronic Signature(s) Signed: 01/21/2021 5:32:40 PM By: Elliot Gurney, BSN, RN, CWS, Kim RN, BSN Entered By: Elliot Gurney, BSN, RN, CWS, Kim on 01/21/2021 10:33:28 Craig [name] ([id number]) -------------------------------------------------------------------------------- Fall Risk Assessment Details Patient Name: Craig [name]. Date of Service: 01/21/2021 10:15 AM Medical Record Number: [id number] Patient Account Number: [id number] Date of  Birth/Sex: 02-11-1935 (86 y.o. M) Treating RN: Huel Coventry Primary Care Vanesa Renier: Lonie Peak Other Clinician: Referring Ortha Metts: Edward Jolly Treating Dynasia Kercheval/Extender: Rowan Blase in Treatment: 0 Fall Risk Assessment Items Have you had 2 or more falls in the last 12 monthso 0 No Have you had any fall that resulted in injury in the last 12 monthso 0 No FALLS RISK SCREEN History of falling - immediate or within 3 months 0 No Secondary diagnosis (Do you have 2 or more medical diagnoseso) 0 No Ambulatory aid None/bed rest/wheelchair/nurse 0 Yes Crutches/cane/walker 0 No Furniture 0 No Intravenous therapy  Access/Saline/Heparin Lock 0 No Gait/Transferring Normal/ bed rest/ wheelchair 0 Yes Weak (short steps with or without shuffle, stooped but able to lift head while walking, may 0 No seek support from furniture) Impaired (short steps with shuffle, may have difficulty arising from chair, head down, impaired 0 No balance) Mental Status Oriented to own ability 0 Yes Electronic Signature(s) Signed: 01/21/2021 5:32:40 PM By: Elliot Gurney, BSN, RN, CWS, Kim RN, BSN Entered By: Elliot Gurney, BSN, RN, CWS, Kim on 01/21/2021 10:34:06 Craig [name] ([id number]) -------------------------------------------------------------------------------- Foot Assessment Details Patient Name: Craig [name]. Date of Service: 01/21/2021 10:15 AM Medical Record Number: [id number] Patient Account Number: [id number] Date of Birth/Sex: 07-31-1934 (86 y.o. M) Treating RN: Huel Coventry Primary Care Omere Marti: Lonie Peak Other Clinician: Referring Reonna Finlayson: Edward Jolly Treating Delanee Xin/Extender: Rowan Blase in Treatment: 0 Foot Assessment Items Site Locations + = Sensation present, - = Sensation absent, C = Callus, U = Ulcer R = Redness, W = Warmth, M = Maceration, PU = Pre-ulcerative lesion F = Fissure, S = Swelling, D = Dryness Assessment Right: Left: Other Deformity: No No Prior Foot  Ulcer: No No Prior Amputation: No No Charcot Joint: No No Ambulatory Status: Ambulatory Without Help Gait: Steady Electronic Signature(s) Signed: 01/21/2021 5:32:40 PM By: Elliot Gurney, BSN, RN, CWS, Kim RN, BSN Entered By: Elliot Gurney, BSN, RN, CWS, Kim on 01/21/2021 10:35:23 Craig [name] ([id number]) -------------------------------------------------------------------------------- Nutrition Risk Screening Details Patient Name: Craig [name]. Date of Service: 01/21/2021 10:15 AM Medical Record Number: [id number] Patient Account Number: [id number] Date of Birth/Sex: April 07, 1934 (86 y.o. M) Treating RN: Huel Coventry Primary Care Jamile Sivils: Lonie Peak Other Clinician: Referring Johnston Maddocks: Edward Jolly Treating Mourad Cwikla/Extender: Rowan Blase in Treatment: 0 Height (in): 69 Weight (lbs): 168 Body Mass Index (BMI): 24.8 Nutrition Risk Screening Items Score Screening NUTRITION RISK SCREEN: I have an illness or condition that made me change the kind and/or amount of food I eat 0 No I eat fewer than two meals per day 3 Yes I eat few fruits and vegetables, or milk products 0 No I have three or more drinks of beer, liquor or wine almost every day 0 No I have tooth or mouth problems that make it hard for me to eat 0 No I don't always have enough money to buy the food I need 0 No I eat alone most of the time 0 No I take three or more different prescribed or over-the-counter drugs a day 0 No Without wanting to, I have lost or gained 10 pounds in the last six months 0 No I am not always physically able to shop, cook and/or feed myself 0 No Nutrition Protocols Good Risk Protocol 0 No interventions needed Moderate Risk Protocol High Risk Proctocol Risk Level: Moderate Risk Score: 3 Electronic Signature(s) Signed: 01/21/2021 5:32:40 PM By: Elliot Gurney, BSN, RN, CWS, Kim RN, BSN Entered By: Elliot Gurney, BSN, RN, CWS, Kim on 01/21/2021 10:34:31

## 2021-01-22 DIAGNOSIS — T2134XA Burn of third degree of lower back, initial encounter: Secondary | ICD-10-CM | POA: Diagnosis not present

## 2021-01-22 DIAGNOSIS — T2133XA Burn of third degree of upper back, initial encounter: Secondary | ICD-10-CM | POA: Diagnosis not present

## 2021-01-28 ENCOUNTER — Other Ambulatory Visit: Payer: Self-pay

## 2021-01-28 ENCOUNTER — Encounter: Payer: Medicare Other | Admitting: Physician Assistant

## 2021-01-28 DIAGNOSIS — E119 Type 2 diabetes mellitus without complications: Secondary | ICD-10-CM | POA: Diagnosis not present

## 2021-01-28 DIAGNOSIS — T2133XA Burn of third degree of upper back, initial encounter: Secondary | ICD-10-CM | POA: Diagnosis not present

## 2021-01-28 DIAGNOSIS — T2134XA Burn of third degree of lower back, initial encounter: Secondary | ICD-10-CM | POA: Diagnosis not present

## 2021-01-28 DIAGNOSIS — I2510 Atherosclerotic heart disease of native coronary artery without angina pectoris: Secondary | ICD-10-CM | POA: Diagnosis not present

## 2021-01-28 DIAGNOSIS — I252 Old myocardial infarction: Secondary | ICD-10-CM | POA: Diagnosis not present
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
## 2021-01-30 DIAGNOSIS — T2133XA Burn of third degree of upper back, initial encounter: Secondary | ICD-10-CM | POA: Diagnosis not present

## 2021-01-31 DIAGNOSIS — T2133XA Burn of third degree of upper back, initial encounter: Secondary | ICD-10-CM | POA: Diagnosis not present

## 2021-02-04 ENCOUNTER — Other Ambulatory Visit: Payer: Self-pay

## 2021-02-04 ENCOUNTER — Encounter: Payer: Medicare Other | Attending: Physician Assistant | Admitting: Physician Assistant

## 2021-02-04 DIAGNOSIS — I2510 Atherosclerotic heart disease of native coronary artery without angina pectoris: Secondary | ICD-10-CM | POA: Diagnosis not present

## 2021-02-04 DIAGNOSIS — I252 Old myocardial infarction: Secondary | ICD-10-CM | POA: Insufficient documentation

## 2021-02-04 DIAGNOSIS — X58XXXA Exposure to other specified factors, initial encounter: Secondary | ICD-10-CM | POA: Diagnosis not present

## 2021-02-04 DIAGNOSIS — T2134XA Burn of third degree of lower back, initial encounter: Secondary | ICD-10-CM | POA: Diagnosis not present

## 2021-02-04 DIAGNOSIS — T2133XA Burn of third degree of upper back, initial encounter: Secondary | ICD-10-CM | POA: Insufficient documentation

## 2021-02-04 DIAGNOSIS — E119 Type 2 diabetes mellitus without complications: Secondary | ICD-10-CM | POA: Insufficient documentation
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
## 2021-02-11 ENCOUNTER — Encounter: Payer: Medicare Other | Admitting: Physician Assistant

## 2021-02-11 ENCOUNTER — Other Ambulatory Visit: Payer: Self-pay

## 2021-02-11 DIAGNOSIS — T2133XA Burn of third degree of upper back, initial encounter: Secondary | ICD-10-CM | POA: Diagnosis not present

## 2021-02-11 DIAGNOSIS — I2510 Atherosclerotic heart disease of native coronary artery without angina pectoris: Secondary | ICD-10-CM | POA: Diagnosis not present

## 2021-02-11 DIAGNOSIS — I252 Old myocardial infarction: Secondary | ICD-10-CM | POA: Diagnosis not present

## 2021-02-11 DIAGNOSIS — E119 Type 2 diabetes mellitus without complications: Secondary | ICD-10-CM | POA: Diagnosis not present

## 2021-02-11 DIAGNOSIS — T2134XA Burn of third degree of lower back, initial encounter: Secondary | ICD-10-CM | POA: Diagnosis not present
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
## 2021-02-11 NOTE — Progress Notes (Addendum)
NAKEEM, MURNANE ([id number]) Visit Report for 02/11/2021 Arrival Information Details Patient Name: Craig [name], Craig [name]. Date of Service: 02/11/2021 1:00 PM Medical Record Number: [id number] Patient Account Number: [id number] Date of Birth/Sex: [birth year] (86 y.o. M) Treating RN: Donnamarie Poag Primary Care Jayshon Dommer: Cyndi Bender Other Clinician: Referring Zarian Colpitts: Cyndi Bender Treating Champayne Kocian/Extender: Skipper Cliche in Treatment: 3 Visit Information History Since Last Visit Added or deleted any medications: No Patient Arrived: Ambulatory Had a fall or experienced change in No Arrival Time: 13:07 activities of daily living that may affect Accompanied By: daughter risk of falls: Transfer Assistance: None Hospitalized since last visit: No Patient Identification Verified: Yes Has Dressing in Place as Prescribed: Yes Secondary Verification Process Completed: Yes Pain Present Now: No Patient Has Alerts: Yes Patient Alerts: Type II Diabetic- diet controlled Electronic Signature(s) Signed: 02/11/2021 2:59:58 PM By: Donnamarie Poag Entered By: Donnamarie Poag on 02/11/2021 13:14:51 Craig [name] ([id number]) -------------------------------------------------------------------------------- Clinic Level of Care Assessment Details Patient Name: Craig [name]. Date of Service: 02/11/2021 1:00 PM Medical Record Number: [id number] Patient Account Number: [id number] Date of Birth/Sex: 01-18-1935 (86 y.o. M) Treating RN: Donnamarie Poag Primary Care Rasaan Brotherton: Cyndi Bender Other Clinician: Referring Zayana Salvador: Cyndi Bender Treating Elise Gladden/Extender: Skipper Cliche in Treatment: 3 Clinic Level of Care Assessment Items TOOL 4 Quantity Score []  - Use when only an EandM is performed on FOLLOW-UP visit 0 ASSESSMENTS - Nursing Assessment / Reassessment []  - Reassessment of Co-morbidities (includes updates in patient status) 0 []  - 0 Reassessment of Adherence to Treatment  Plan ASSESSMENTS - Wound and Skin Assessment / Reassessment X - Simple Wound Assessment / Reassessment - one wound 1 5 []  - 0 Complex Wound Assessment / Reassessment - multiple wounds []  - 0 Dermatologic / Skin Assessment (not related to wound area) ASSESSMENTS - Focused Assessment []  - Circumferential Edema Measurements - multi extremities 0 []  - 0 Nutritional Assessment / Counseling / Intervention []  - 0 Lower Extremity Assessment (monofilament, tuning fork, pulses) []  - 0 Peripheral Arterial Disease Assessment (using hand held doppler) ASSESSMENTS - Ostomy and/or Continence Assessment and Care []  - Incontinence Assessment and Management 0 []  - 0 Ostomy Care Assessment and Management (repouching, etc.) PROCESS - Coordination of Care X - Simple Patient / Family Education for ongoing care 1 15 []  - 0 Complex (extensive) Patient / Family Education for ongoing care []  - 0 Staff obtains Programmer, systems, Records, Test Results / Process Orders []  - 0 Staff telephones HHA, Nursing Homes / Clarify orders / etc []  - 0 Routine Transfer to another Facility (non-emergent condition) []  - 0 Routine Hospital Admission (non-emergent condition) []  - 0 New Admissions / Biomedical engineer / Ordering NPWT, Apligraf, etc. []  - 0 Emergency Hospital Admission (emergent condition) X- 1 10 Simple Discharge Coordination []  - 0 Complex (extensive) Discharge Coordination PROCESS - Special Needs []  - Pediatric / Minor Patient Management 0 []  - 0 Isolation Patient Management []  - 0 Hearing / Language / Visual special needs []  - 0 Assessment of Community assistance (transportation, D/C planning, etc.) []  - 0 Additional assistance / Altered mentation []  - 0 Support Surface(s) Assessment (bed, cushion, seat, etc.) INTERVENTIONS - Wound Cleansing / Measurement Craig [name], Craig [name]. ([id number]) X- 1 5 Simple Wound Cleansing - one wound []  - 0 Complex Wound Cleansing - multiple wounds X- 1  5 Wound Imaging (photographs - any number of wounds) []  - 0 Wound Tracing (instead of photographs) X- 1 5 Simple Wound Measurement - one wound []  - 0 Complex Wound  Measurement - multiple wounds INTERVENTIONS - Wound Dressings []  - Small Wound Dressing one or multiple wounds 0 []  - 0 Medium Wound Dressing one or multiple wounds X- 1 20 Large Wound Dressing one or multiple wounds []  - 0 Application of Medications - topical []  - 0 Application of Medications - injection INTERVENTIONS - Miscellaneous []  - External ear exam 0 []  - 0 Specimen Collection (cultures, biopsies, blood, body fluids, etc.) []  - 0 Specimen(s) / Culture(s) sent or taken to Lab for analysis []  - 0 Patient Transfer (multiple staff / Civil Service fast streamer / Similar devices) []  - 0 Simple Staple / Suture removal (25 or less) []  - 0 Complex Staple / Suture removal (26 or more) []  - 0 Hypo / Hyperglycemic Management (close monitor of Blood Glucose) []  - 0 Ankle / Brachial Index (ABI) - do not check if billed separately X- 1 5 Vital Signs Has the patient been seen at the hospital within the last three years: Yes Total Score: 70 Level Of Care: New/Established - Level 2 Electronic Signature(s) Signed: 02/11/2021 2:59:58 PM By: Donnamarie Poag Entered By: Donnamarie Poag on 02/11/2021 13:31:19 Craig [name] ([id number]) -------------------------------------------------------------------------------- Encounter Discharge Information Details Patient Name: Craig [name]. Date of Service: 02/11/2021 1:00 PM Medical Record Number: [id number] Patient Account Number: [id number] Date of Birth/Sex: 16-Jun-1934 (86 y.o. M) Treating RN: Donnamarie Poag Primary Care Mcgwire Dasaro: Cyndi Bender Other Clinician: Referring Javonna Balli: Cyndi Bender Treating Allen Basista/Extender: Skipper Cliche in Treatment: 3 Encounter Discharge Information Items Discharge Condition: Stable Ambulatory Status: Ambulatory Discharge Destination:  Home Transportation: Private Auto Accompanied By: self Schedule Follow-up Appointment: Yes Clinical Summary of Care: Electronic Signature(s) Signed: 02/11/2021 2:59:58 PM By: Donnamarie Poag Entered By: Donnamarie Poag on 02/11/2021 13:51:24 Craig [name] ([id number]) -------------------------------------------------------------------------------- Lower Extremity Assessment Details Patient Name: Craig [name]. Date of Service: 02/11/2021 1:00 PM Medical Record Number: [id number] Patient Account Number: [id number] Date of Birth/Sex: 12-27-1934 (86 y.o. M) Treating RN: Donnamarie Poag Primary Care Chanta Bauers: Cyndi Bender Other Clinician: Referring Monnie Anspach: Cyndi Bender Treating Wadie Mattie/Extender: Jeri Cos Weeks in Treatment: 3 Electronic Signature(s) Signed: 02/11/2021 2:59:58 PM By: Donnamarie Poag Entered By: Donnamarie Poag on 02/11/2021 13:16:26 Craig [name] ([id number]) -------------------------------------------------------------------------------- Multi Wound Chart Details Patient Name: Craig [name]. Date of Service: 02/11/2021 1:00 PM Medical Record Number: [id number] Patient Account Number: [id number] Date of Birth/Sex: [birth year] (86 y.o. M) Treating RN: Donnamarie Poag Primary Care Jasleen Riepe: Cyndi Bender Other Clinician: Referring Aisa Schoeppner: Cyndi Bender Treating Madysun Thall/Extender: Skipper Cliche in Treatment: 3 Vital Signs Height(in): 69 Pulse(bpm): 73 Weight(lbs): 168 Blood Pressure(mmHg): 148/76 Body Mass Index(BMI): 25 Temperature(F): 98.8 Respiratory Rate(breaths/min): 16 Photos: [N/A:N/A] Wound Location: Back N/A N/A Wounding Event: Thermal Burn N/A N/A Primary Etiology: 3rd degree Burn N/A N/A Comorbid History: Coronary Artery Disease, N/A N/A Myocardial Infarction, Type II Diabetes, History of Burn Date Acquired: 06/06/2019 N/A N/A Weeks of Treatment: 3 N/A N/A Wound Status: Open N/A N/A Measurements L x W x D (cm) 9.5x10x0.1 N/A N/A Area  (cm) : 74.613 N/A N/A Volume (cm) : 7.461 N/A N/A % Reduction in Area: 78.90% N/A N/A % Reduction in Volume: 78.90% N/A N/A Classification: Full Thickness Without Exposed N/A N/A Support Structures Exudate Amount: Large N/A N/A Exudate Type: Sanguinous N/A N/A Exudate Color: red N/A N/A Granulation Amount: Large (67-100%) N/A N/A Necrotic Amount: Small (1-33%) N/A N/A Exposed Structures: Fat Layer (Subcutaneous Tissue): N/A N/A Yes Fascia: No Tendon: No Muscle: No Joint: No Bone: No Epithelialization: None N/A N/A Treatment Notes Electronic Signature(s) Signed:  02/11/2021 2:59:58 PM By: Donnamarie Poag Entered By: Donnamarie Poag on 02/11/2021 13:17:36 Craig [name] ([id number]) -------------------------------------------------------------------------------- [place] Details Patient Name: Craig [name]. Date of Service: 02/11/2021 1:00 PM Medical Record Number: [id number] Patient Account Number: [id number] Date of Birth/Sex: [birth year] (86 y.o. M) Treating RN: Donnamarie Poag Primary Care Kinslei Labine: Cyndi Bender Other Clinician: Referring Danne Scardina: Cyndi Bender Treating Olusegun Gerstenberger/Extender: Skipper Cliche in Treatment: 3 Active Inactive Pain, Acute or Chronic Nursing Diagnoses: Pain Management - Non-cyclic Acute (Procedural) Goals: Patient will verbalize adequate pain control and receive pain control interventions during procedures as needed Date Initiated: 01/21/2021 Target Resolution Date: 01/21/2021 Goal Status: Active Patient/caregiver will verbalize comfort level met Date Initiated: 01/21/2021 Target Resolution Date: 01/21/2021 Goal Status: Active Interventions: Encourage patient to take pain medications as prescribed Provision of support: recognize patient pain, provide comfort and support as needed Reposition patient for comfort Treatment Activities: Administer pain control measures as ordered : 01/21/2021 Notes: Wound/Skin  Impairment Nursing Diagnoses: Impaired tissue integrity Goals: Patient/caregiver will verbalize understanding of skin care regimen Date Initiated: 01/21/2021 Date Inactivated: 02/11/2021 Target Resolution Date: 01/21/2021 Goal Status: Met Ulcer/skin breakdown will have a volume reduction of 30% by week 4 Date Initiated: 01/21/2021 Target Resolution Date: 02/21/2021 Goal Status: Active Interventions: Assess ulceration(s) every visit Treatment Activities: Skin care regimen initiated : 01/21/2021 Topical wound management initiated : 01/21/2021 Notes: Electronic Signature(s) Signed: 02/11/2021 2:59:58 PM By: Donnamarie Poag Entered By: Donnamarie Poag on 02/11/2021 13:17:10 Craig [name] ([id number]) -------------------------------------------------------------------------------- Pain Assessment Details Patient Name: Craig [name]. Date of Service: 02/11/2021 1:00 PM Medical Record Number: [id number] Patient Account Number: [id number] Date of Birth/Sex: 1934/06/13 (86 y.o. M) Treating RN: Donnamarie Poag Primary Care Danashia Landers: Cyndi Bender Other Clinician: Referring Jancarlos Thrun: Cyndi Bender Treating Jeryl Umholtz/Extender: Skipper Cliche in Treatment: 3 Active Problems Location of Pain Severity and Description of Pain Patient Has Paino No Site Locations Rate the pain. Current Pain Level: 0 Pain Management and Medication Current Pain Management: Notes not at this time Electronic Signature(s) Signed: 02/11/2021 2:59:58 PM By: Donnamarie Poag Entered By: Donnamarie Poag on 02/11/2021 13:15:30 Craig [name] ([id number]) -------------------------------------------------------------------------------- Patient/Caregiver Education Details Patient Name: Craig [name]. Date of Service: 02/11/2021 1:00 PM Medical Record Number: [id number] Patient Account Number: [id number] Date of Birth/Gender: May 22, 1934 (86 y.o. M) Treating RN: Donnamarie Poag Primary Care Physician: Cyndi Bender  Other Clinician: Referring Physician: Cyndi Bender Treating Physician/Extender: Skipper Cliche in Treatment: 3 Education Assessment Education Provided To: Patient Education Topics Provided Basic Hygiene: Pain: Wound/Skin Impairment: Electronic Signature(s) Signed: 02/11/2021 2:59:58 PM By: Donnamarie Poag Entered By: Donnamarie Poag on 02/11/2021 13:50:24 Craig [name] ([id number]) -------------------------------------------------------------------------------- Wound Assessment Details Patient Name: Craig [name]. Date of Service: 02/11/2021 1:00 PM Medical Record Number: [id number] Patient Account Number: [id number] Date of Birth/Sex: 1934/09/18 (86 y.o. M) Treating RN: Donnamarie Poag Primary Care Jerlisa Diliberto: Cyndi Bender Other Clinician: Referring Darryel Diodato: Cyndi Bender Treating Camren Lipsett/Extender: Jeri Cos Weeks in Treatment: 3 Wound Status Wound Number: 1 Primary 3rd degree Burn Etiology: Wound Location: Back Wound Status: Open Wounding Event: Thermal Burn Comorbid Coronary Artery Disease, Myocardial Infarction, Type II Date Acquired: 06/06/2019 History: Diabetes, History of Burn Weeks Of Treatment: 3 Clustered Wound: No Photos Wound Measurements Length: (cm) 9.5 Width: (cm) 10 Depth: (cm) 0.1 Area: (cm) 74.613 Volume: (cm) 7.461 % Reduction in Area: 78.9% % Reduction in Volume: 78.9% Epithelialization: None Tunneling: No Undermining: No Wound Description Classification: Full Thickness Without Exposed Support Structu Exudate Amount: Large Exudate Type: Sanguinous Exudate Color: red res  Foul Odor After Cleansing: No Slough/Fibrino No Wound Bed Granulation Amount: Large (67-100%) Exposed Structure Necrotic Amount: Small (1-33%) Fascia Exposed: No Necrotic Quality: Adherent Slough Fat Layer (Subcutaneous Tissue) Exposed: Yes Tendon Exposed: No Muscle Exposed: No Joint Exposed: No Bone Exposed: No Treatment Notes Wound #1  (Back) Cleanser Peri-Wound Care AandD Ointment Discharge Instruction: Apply AandD Ointment skin around Craig [name], Craig ([id number]) Topical Primary Dressing Hydrofera Blue Ready Transfer Foam, 4x5 (in/in) Discharge Instruction: Apply Hydrofera Blue Ready to wound bed as directed Secondary Dressing ABD Pad 5x9 (in/in) Discharge Instruction: Cover with ABD pad Secured With 5M Medipore H Soft Cloth Surgical Tape, 2x2 (in/yd) Discharge Instruction: Make sure tape is outside of newly healed skin outside the wound boundaries-just use enough to hold in place Compression Wrap Compression Stockings Add-Ons Electronic Signature(s) Signed: 02/11/2021 2:59:58 PM By: Donnamarie Poag Entered By: Donnamarie Poag on 02/11/2021 13:16:06 Craig [name] ([id number]) -------------------------------------------------------------------------------- [place] Details Patient Name: Craig [name]. Date of Service: 02/11/2021 1:00 PM Medical Record Number: [id number] Patient Account Number: [id number] Date of Birth/Sex: July 30, 1934 (86 y.o. M) Treating RN: Donnamarie Poag Primary Care Neylan Koroma: Cyndi Bender Other Clinician: Referring Elycia Woodside: Cyndi Bender Treating Cherae Marton/Extender: Skipper Cliche in Treatment: 3 Vital Signs Time Taken: 15:14 Temperature (F): 98.8 Height (in): 69 Pulse (bpm): 66 Weight (lbs): 168 Respiratory Rate (breaths/min): 16 Body Mass Index (BMI): 24.8 Blood Pressure (mmHg): 148/76 Reference Range: 80 - 120 mg / dl Electronic Signature(s) Signed: 02/11/2021 2:59:58 PM By: Donnamarie Poag Entered ByDonnamarie Poag on 02/11/2021 13:15:13

## 2021-02-12 DIAGNOSIS — T2133XA Burn of third degree of upper back, initial encounter: Secondary | ICD-10-CM | POA: Diagnosis not present

## 2021-02-18 ENCOUNTER — Other Ambulatory Visit: Payer: Self-pay

## 2021-02-18 ENCOUNTER — Ambulatory Visit: Payer: Medicare Other | Admitting: Interventional Cardiology

## 2021-02-18 ENCOUNTER — Encounter: Payer: Medicare Other | Admitting: Physician Assistant

## 2021-02-18 DIAGNOSIS — E119 Type 2 diabetes mellitus without complications: Secondary | ICD-10-CM | POA: Diagnosis not present

## 2021-02-18 DIAGNOSIS — I2510 Atherosclerotic heart disease of native coronary artery without angina pectoris: Secondary | ICD-10-CM | POA: Diagnosis not present

## 2021-02-18 DIAGNOSIS — I252 Old myocardial infarction: Secondary | ICD-10-CM | POA: Diagnosis not present

## 2021-02-18 DIAGNOSIS — T2133XA Burn of third degree of upper back, initial encounter: Secondary | ICD-10-CM | POA: Diagnosis not present

## 2021-02-18 DIAGNOSIS — T2134XA Burn of third degree of lower back, initial encounter: Secondary | ICD-10-CM | POA: Diagnosis not present
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
## 2021-02-19 DIAGNOSIS — T2133XA Burn of third degree of upper back, initial encounter: Secondary | ICD-10-CM | POA: Diagnosis not present

## 2021-02-25 ENCOUNTER — Encounter: Payer: Medicare Other | Admitting: Physician Assistant

## 2021-02-25 ENCOUNTER — Other Ambulatory Visit: Payer: Self-pay

## 2021-02-25 DIAGNOSIS — I2510 Atherosclerotic heart disease of native coronary artery without angina pectoris: Secondary | ICD-10-CM | POA: Diagnosis not present

## 2021-02-25 DIAGNOSIS — T2134XA Burn of third degree of lower back, initial encounter: Secondary | ICD-10-CM | POA: Diagnosis not present

## 2021-02-25 DIAGNOSIS — E119 Type 2 diabetes mellitus without complications: Secondary | ICD-10-CM | POA: Diagnosis not present

## 2021-02-25 DIAGNOSIS — T2133XA Burn of third degree of upper back, initial encounter: Secondary | ICD-10-CM | POA: Diagnosis not present

## 2021-02-25 DIAGNOSIS — I252 Old myocardial infarction: Secondary | ICD-10-CM | POA: Diagnosis not present

## 2021-02-25 NOTE — Progress Notes (Signed)
KEI, MCELHINEY ([id number]) Visit Report for 02/25/2021 Arrival Information Details Patient Name: Craig [name], Craig [name]. Date of Service: 02/25/2021 2:00 PM Medical Record Number: [id number] Patient Account Number: [id number] Date of Birth/Sex: 1934-12-27 (86 y.o. M) Treating RN: Cornell Barman Primary Care Paidyn Mcferran: Cyndi Bender Other Clinician: Referring Syncere Eble: Cyndi Bender Treating Elfreida Heggs/Extender: Skipper Cliche in Treatment: 5 Visit Information History Since Last Visit Added or deleted any medications: No Patient Arrived: Ambulatory Has Dressing in Place as Prescribed: Yes Arrival Time: 13:57 Pain Present Now: No Accompanied By: daughter Transfer Assistance: None Patient Identification Verified: Yes Secondary Verification Process Completed: Yes Patient Has Alerts: Yes Patient Alerts: Type II Diabetic- diet controlled Electronic Signature(s) Signed: 02/25/2021 4:45:38 PM By: Gretta Cool, BSN, RN, CWS, Kim RN, BSN Entered By: Gretta Cool, BSN, RN, CWS, Kim on 02/25/2021 13:58:20 Craig [name] ([id number]) -------------------------------------------------------------------------------- Encounter Discharge Information Details Patient Name: Craig [name]. Date of Service: 02/25/2021 2:00 PM Medical Record Number: [id number] Patient Account Number: [id number] Date of Birth/Sex: 04/07/1934 (86 y.o. M) Treating RN: Cornell Barman Primary Care Oluwasemilore Bahl: Cyndi Bender Other Clinician: Referring Erman Thum: Cyndi Bender Treating Talik Casique/Extender: Skipper Cliche in Treatment: 5 Encounter Discharge Information Items Discharge Condition: Stable Ambulatory Status: Ambulatory Discharge Destination: Home Transportation: Private Auto Accompanied By: daughter Schedule Follow-up Appointment: Yes Clinical Summary of Care: Electronic Signature(s) Signed: 02/25/2021 4:45:38 PM By: Gretta Cool, BSN, RN, CWS, Kim RN, BSN Entered By: Gretta Cool, BSN, RN, CWS, Kim on 02/25/2021  14:47:52 Craig [name] ([id number]) -------------------------------------------------------------------------------- Lower Extremity Assessment Details Patient Name: Craig [name]. Date of Service: 02/25/2021 2:00 PM Medical Record Number: [id number] Patient Account Number: [id number] Date of Birth/Sex: October 04, 1934 (86 y.o. M) Treating RN: Cornell Barman Primary Care Sonal Dorwart: Cyndi Bender Other Clinician: Referring Jaquin Coy: Cyndi Bender Treating Milton Sagona/Extender: Skipper Cliche in Treatment: 5 Electronic Signature(s) Signed: 02/25/2021 4:45:38 PM By: Gretta Cool, BSN, RN, CWS, Kim RN, BSN Entered By: Gretta Cool, BSN, RN, CWS, Kim on 02/25/2021 14:08:25 Craig [name] ([id number]) -------------------------------------------------------------------------------- Multi Wound Chart Details Patient Name: Craig [name]. Date of Service: 02/25/2021 2:00 PM Medical Record Number: [id number] Patient Account Number: [id number] Date of Birth/Sex: [birth year] (86 y.o. M) Treating RN: Cornell Barman Primary Care Jakyah Bradby: Cyndi Bender Other Clinician: Referring Anavi Branscum: Cyndi Bender Treating Jensyn Shave/Extender: Skipper Cliche in Treatment: 5 Vital Signs Height(in): 69 Pulse(bpm): 70 Weight(lbs): 168 Blood Pressure(mmHg): 166/73 Body Mass Index(BMI): 25 Temperature([name]): 98.7 Respiratory Rate(breaths/min): 16 Photos: [N/A:N/A] Wound Location: Back N/A N/A Wounding Event: Thermal Burn N/A N/A Primary Etiology: 3rd degree Burn N/A N/A Comorbid History: Coronary Artery Disease, N/A N/A Myocardial Infarction, Type II Diabetes, History of Burn Date Acquired: 06/06/2019 N/A N/A Weeks of Treatment: 5 N/A N/A Wound Status: Open N/A N/A Measurements L x W x D (cm) 6x9x0.1 N/A N/A Area (cm) : 42.412 N/A N/A Volume (cm) : 4.241 N/A N/A % Reduction in Area: 88.00% N/A N/A % Reduction in Volume: 88.00% N/A N/A Classification: Full Thickness Without Exposed N/A N/A Support  Structures Exudate Amount: Large N/A N/A Exudate Type: Serous N/A N/A Exudate Color: amber N/A N/A Granulation Amount: Large (67-100%) N/A N/A Granulation Quality: Red, Friable N/A N/A Necrotic Amount: None Present (0%) N/A N/A Exposed Structures: Fat Layer (Subcutaneous Tissue): N/A N/A Yes Fascia: No Tendon: No Muscle: No Joint: No Bone: No Epithelialization: None N/A N/A Treatment Notes Electronic Signature(s) Signed: 02/25/2021 4:45:38 PM By: Gretta Cool, BSN, RN, CWS, Kim RN, BSN Entered By: Gretta Cool, BSN, RN, CWS, Kim on 02/25/2021 14:08:43 Craig [name] ([id number]) -------------------------------------------------------------------------------- Multi-Disciplinary Care Plan Details Patient Name:  Craig [name]. Date of Service: 02/25/2021 2:00 PM Medical Record Number: [id number] Patient Account Number: [id number] Date of Birth/Sex: 1934/06/18 (86 y.o. M) Treating RN: Cornell Barman Primary Care Jannell Franta: Cyndi Bender Other Clinician: Referring Geoffery Aultman: Cyndi Bender Treating Azelie Noguera/Extender: Skipper Cliche in Treatment: 5 Active Inactive Pain, Acute or Chronic Nursing Diagnoses: Pain Management - Non-cyclic Acute (Procedural) Goals: Patient will verbalize adequate pain control and receive pain control interventions during procedures as needed Date Initiated: 01/21/2021 Target Resolution Date: 01/21/2021 Goal Status: Active Patient/caregiver will verbalize comfort level met Date Initiated: 01/21/2021 Target Resolution Date: 01/21/2021 Goal Status: Active Interventions: Encourage patient to take pain medications as prescribed Provision of support: recognize patient pain, provide comfort and support as needed Reposition patient for comfort Treatment Activities: Administer pain control measures as ordered : 01/21/2021 Notes: Wound/Skin Impairment Nursing Diagnoses: Impaired tissue integrity Goals: Patient/caregiver will verbalize understanding of skin  care regimen Date Initiated: 01/21/2021 Date Inactivated: 02/11/2021 Target Resolution Date: 01/21/2021 Goal Status: Met Ulcer/skin breakdown will have a volume reduction of 30% by week 4 Date Initiated: 01/21/2021 Target Resolution Date: 02/21/2021 Goal Status: Active Interventions: Assess ulceration(s) every visit Treatment Activities: Skin care regimen initiated : 01/21/2021 Topical wound management initiated : 01/21/2021 Notes: Electronic Signature(s) Signed: 02/25/2021 4:45:38 PM By: Gretta Cool, BSN, RN, CWS, Kim RN, BSN Entered By: Gretta Cool, BSN, RN, CWS, Kim on 02/25/2021 14:08:34 Craig [name] ([id number]) -------------------------------------------------------------------------------- Pain Assessment Details Patient Name: Craig [name]. Date of Service: 02/25/2021 2:00 PM Medical Record Number: [id number] Patient Account Number: [id number] Date of Birth/Sex: 1934/04/25 (86 y.o. M) Treating RN: Cornell Barman Primary Care Lekesha Claw: Cyndi Bender Other Clinician: Referring Azarya Oconnell: Cyndi Bender Treating Ivaan Liddy/Extender: Skipper Cliche in Treatment: 5 Active Problems Location of Pain Severity and Description of Pain Patient Has Paino Yes Site Locations Pain Management and Medication Current Pain Management: Notes patient denies pain at this time Electronic Signature(s) Signed: 02/25/2021 4:45:38 PM By: Gretta Cool, BSN, RN, CWS, Kim RN, BSN Entered By: Gretta Cool, BSN, RN, CWS, Kim on 02/25/2021 13:58:58 Craig [name] ([id number]) -------------------------------------------------------------------------------- Patient/Caregiver Education Details Patient Name: Craig [name]. Date of Service: 02/25/2021 2:00 PM Medical Record Number: [id number] Patient Account Number: [id number] Date of Birth/Gender: [birth year] (86 y.o. M) Treating RN: Cornell Barman Primary Care Physician: Cyndi Bender Other Clinician: Referring Physician: Cyndi Bender Treating  Physician/Extender: Skipper Cliche in Treatment: 5 Education Assessment Education Provided To: Patient Education Topics Provided Wound/Skin Impairment: Handouts: Caring for Your Ulcer Methods: Demonstration, Explain/Verbal Responses: State content correctly Electronic Signature(s) Signed: 02/25/2021 4:45:38 PM By: Gretta Cool, BSN, RN, CWS, Kim RN, BSN Entered By: Gretta Cool, BSN, RN, CWS, Kim on 02/25/2021 14:39:05 Craig [name] ([id number]) -------------------------------------------------------------------------------- Wound Assessment Details Patient Name: Craig [name]. Date of Service: 02/25/2021 2:00 PM Medical Record Number: [id number] Patient Account Number: [id number] Date of Birth/Sex: [birth year] (86 y.o. M) Treating RN: Cornell Barman Primary Care Toshia Larkin: Cyndi Bender Other Clinician: Referring Lilleigh Hechavarria: Cyndi Bender Treating Gavon Majano/Extender: Jeri Cos Weeks in Treatment: 5 Wound Status Wound Number: 1 Primary 3rd degree Burn Etiology: Wound Location: Back Wound Status: Open Wounding Event: Thermal Burn Comorbid Coronary Artery Disease, Myocardial Infarction, Type II Date Acquired: 06/06/2019 History: Diabetes, History of Burn Weeks Of Treatment: 5 Clustered Wound: No Photos Wound Measurements Length: (cm) 6 Width: (cm) 9 Depth: (cm) 0.1 Area: (cm) 42.412 Volume: (cm) 4.241 % Reduction in Area: 88% % Reduction in Volume: 88% Epithelialization: None Tunneling: No Undermining: No Wound Description Classification: Full Thickness Without Exposed Support Structures Exudate Amount: Large Exudate Type: Serous  Exudate Color: amber Foul Odor After Cleansing: No Slough/Fibrino No Wound Bed Granulation Amount: Large (67-100%) Exposed Structure Granulation Quality: Red, Friable Fascia Exposed: No Necrotic Amount: None Present (0%) Fat Layer (Subcutaneous Tissue) Exposed: Yes Tendon Exposed: No Muscle Exposed: No Joint Exposed: No Bone Exposed:  No Treatment Notes Wound #1 (Back) Cleanser Peri-Wound Care AandD Ointment Discharge Instruction: Apply AandD Ointment skin around BAIRD, POLINSKI ([id number]) Topical Primary Dressing Silflex Contact Layer dressing, 5x6 (in/in) Secondary Dressing Hydrofera Blue Ready Transfer Foam, 4x5 (in/in) Discharge Instruction: Apply to wound bed over non-stick dressing. Secured With 51M Medipore H Soft Cloth Surgical Tape, 2x2 (in/yd) Discharge Instruction: Make sure tape is outside of newly healed skin outside the wound boundaries-just use enough to hold in place ABD Pad 5X9 Compression Wrap Compression Stockings Add-Ons Electronic Signature(s) Signed: 02/25/2021 4:45:38 PM By: Gretta Cool, BSN, RN, CWS, Kim RN, BSN Entered By: Gretta Cool, BSN, RN, CWS, Kim on 02/25/2021 14:08:13 Craig [name] ([id number]) -------------------------------------------------------------------------------- [place] Details Patient Name: Craig [name]. Date of Service: 02/25/2021 2:00 PM Medical Record Number: [id number] Patient Account Number: [id number] Date of Birth/Sex: 12/29/1934 (86 y.o. M) Treating RN: Cornell Barman Primary Care Verita Kuroda: Cyndi Bender Other Clinician: Referring Raynell Upton: Cyndi Bender Treating Tarisha Fader/Extender: Skipper Cliche in Treatment: 5 Vital Signs Time Taken: 13:58 Temperature ([name]): 98.7 Height (in): 69 Pulse (bpm): 70 Weight (lbs): 168 Respiratory Rate (breaths/min): 16 Body Mass Index (BMI): 24.8 Blood Pressure (mmHg): 166/73 Reference Range: 80 - 120 mg / dl Electronic Signature(s) Signed: 02/25/2021 4:45:38 PM By: Gretta Cool, BSN, RN, CWS, Kim RN, BSN Entered By: Gretta Cool, BSN, RN, CWS, Kim on 02/25/2021 13:58:44
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
## 2021-02-28 DIAGNOSIS — T2133XA Burn of third degree of upper back, initial encounter: Secondary | ICD-10-CM | POA: Diagnosis not present

## 2021-03-04 ENCOUNTER — Encounter: Payer: Medicare Other | Admitting: Physician Assistant

## 2021-03-04 ENCOUNTER — Other Ambulatory Visit: Payer: Self-pay

## 2021-03-04 DIAGNOSIS — T2133XA Burn of third degree of upper back, initial encounter: Secondary | ICD-10-CM | POA: Diagnosis not present

## 2021-03-04 DIAGNOSIS — T2134XA Burn of third degree of lower back, initial encounter: Secondary | ICD-10-CM | POA: Diagnosis not present

## 2021-03-04 DIAGNOSIS — I252 Old myocardial infarction: Secondary | ICD-10-CM | POA: Diagnosis not present

## 2021-03-04 DIAGNOSIS — E119 Type 2 diabetes mellitus without complications: Secondary | ICD-10-CM | POA: Diagnosis not present

## 2021-03-04 DIAGNOSIS — I2510 Atherosclerotic heart disease of native coronary artery without angina pectoris: Secondary | ICD-10-CM | POA: Diagnosis not present
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
## 2021-03-04 NOTE — Progress Notes (Addendum)
ASA, BAUDOIN ([id number]) Visit Report for 03/04/2021 Arrival Information Details Patient Name: Craig [name], Craig [name]. Date of Service: 03/04/2021 2:15 PM Medical Record Number: [id number] Patient Account Number: [id number] Date of Birth/Sex: 30-Dec-1934 (86 y.o. M) Treating RN: Cornell Barman Primary Care Dorathy Stallone: Cyndi Bender Other Clinician: Referring Lekeith Wulf: Cyndi Bender Treating Juergen Hardenbrook/Extender: Skipper Cliche in Treatment: 6 Visit Information History Since Last Visit Added or deleted any medications: No Patient Arrived: Ambulatory Has Dressing in Place as Prescribed: Yes Arrival Time: 14:29 Pain Present Now: No Accompanied By: daughter Transfer Assistance: None Patient Identification Verified: Yes Secondary Verification Process Completed: Yes Patient Has Alerts: Yes Patient Alerts: Type II Diabetic- diet controlled Electronic Signature(s) Signed: 03/04/2021 5:03:13 PM By: Gretta Cool, BSN, RN, CWS, Kim RN, BSN Entered By: Gretta Cool, BSN, RN, CWS, Kim on 03/04/2021 14:32:09 Craig [name] ([id number]) -------------------------------------------------------------------------------- Encounter Discharge Information Details Patient Name: Craig [name]. Date of Service: 03/04/2021 2:15 PM Medical Record Number: [id number] Patient Account Number: [id number] Date of Birth/Sex: January 22, 1935 (86 y.o. M) Treating RN: Cornell Barman Primary Care Cincere Zorn: Cyndi Bender Other Clinician: Referring Omere Marti: Cyndi Bender Treating Kamera Dubas/Extender: Skipper Cliche in Treatment: 6 Encounter Discharge Information Items Post Procedure Vitals Discharge Condition: Stable Temperature (F): 98.4 Ambulatory Status: Ambulatory Pulse (bpm): 79 Discharge Destination: Home Respiratory Rate (breaths/min): 16 Transportation: Private Auto Blood Pressure (mmHg): 172/68 Accompanied By: daughters Schedule Follow-up Appointment: Yes Clinical Summary of Care: Electronic  Signature(s) Signed: 03/04/2021 5:03:13 PM By: Gretta Cool, BSN, RN, CWS, Kim RN, BSN Entered By: Gretta Cool, BSN, RN, CWS, Kim on 03/04/2021 15:15:42 Craig [name] ([id number]) -------------------------------------------------------------------------------- Lower Extremity Assessment Details Patient Name: Craig [name]. Date of Service: 03/04/2021 2:15 PM Medical Record Number: [id number] Patient Account Number: [id number] Date of Birth/Sex: 12-08-1934 (86 y.o. M) Treating RN: Cornell Barman Primary Care Marveline Profeta: Cyndi Bender Other Clinician: Referring Erickson Yamashiro: Cyndi Bender Treating Nolie Bignell/Extender: Skipper Cliche in Treatment: 6 Electronic Signature(s) Signed: 03/04/2021 5:03:13 PM By: Gretta Cool, BSN, RN, CWS, Kim RN, BSN Entered By: Gretta Cool, BSN, RN, CWS, Kim on 03/04/2021 14:40:56 Craig [name] ([id number]) -------------------------------------------------------------------------------- Multi Wound Chart Details Patient Name: Craig [name]. Date of Service: 03/04/2021 2:15 PM Medical Record Number: [id number] Patient Account Number: [id number] Date of Birth/Sex: Sep 20, 1934 (86 y.o. M) Treating RN: Cornell Barman Primary Care Harvy Riera: Cyndi Bender Other Clinician: Referring Bethel Gaglio: Cyndi Bender Treating Akelia Husted/Extender: Skipper Cliche in Treatment: 6 Vital Signs Height(in): 69 Pulse(bpm): 72 Weight(lbs): 168 Blood Pressure(mmHg): 172/68 Body Mass Index(BMI): 25 Temperature(F): 98.4 Respiratory Rate(breaths/min): 16 Photos: [N/A:N/A] Wound Location: Back N/A N/A Wounding Event: Thermal Burn N/A N/A Primary Etiology: 3rd degree Burn N/A N/A Comorbid History: Coronary Artery Disease, N/A N/A Myocardial Infarction, Type II Diabetes, History of Burn Date Acquired: 06/06/2019 N/A N/A Weeks of Treatment: 6 N/A N/A Wound Status: Open N/A N/A Measurements L x W x D (cm) 5x9x0.1 N/A N/A Area (cm) : 35.343 N/A N/A Volume (cm) : 3.534 N/A N/A % Reduction  in Area: 90.00% N/A N/A % Reduction in Volume: 90.00% N/A N/A Classification: Full Thickness Without Exposed N/A N/A Support Structures Exudate Amount: Large N/A N/A Exudate Type: Serous N/A N/A Exudate Color: amber N/A N/A Granulation Amount: Large (67-100%) N/A N/A Granulation Quality: Red, Friable N/A N/A Necrotic Amount: None Present (0%) N/A N/A Exposed Structures: Fat Layer (Subcutaneous Tissue): N/A N/A Yes Fascia: No Tendon: No Muscle: No Joint: No Bone: No Epithelialization: None N/A N/A Treatment Notes Electronic Signature(s) Signed: 03/04/2021 5:03:13 PM By: Gretta Cool, BSN, RN, CWS, Kim RN, BSN Entered By: Gretta Cool, BSN,  RN, CWS, Kim on 03/04/2021 15:11:43 VIRGIL, LIGHTNER ([id number]) -------------------------------------------------------------------------------- [place] Details Patient Name: Craig [name]. Date of Service: 03/04/2021 2:15 PM Medical Record Number: [id number] Patient Account Number: [id number] Date of Birth/Sex: 09-07-1934 (86 y.o. M) Treating RN: Cornell Barman Primary Care Kaseem Vastine: Cyndi Bender Other Clinician: Referring Wyatte Dames: Cyndi Bender Treating Germani Gavilanes/Extender: Skipper Cliche in Treatment: 6 Active Inactive Pain, Acute or Chronic Nursing Diagnoses: Pain Management - Non-cyclic Acute (Procedural) Goals: Patient will verbalize adequate pain control and receive pain control interventions during procedures as needed Date Initiated: 01/21/2021 Target Resolution Date: 01/21/2021 Goal Status: Active Patient/caregiver will verbalize comfort level met Date Initiated: 01/21/2021 Target Resolution Date: 01/21/2021 Goal Status: Active Interventions: Encourage patient to take pain medications as prescribed Provision of support: recognize patient pain, provide comfort and support as needed Reposition patient for comfort Treatment Activities: Administer pain control measures as ordered :  01/21/2021 Notes: Wound/Skin Impairment Nursing Diagnoses: Impaired tissue integrity Goals: Patient/caregiver will verbalize understanding of skin care regimen Date Initiated: 01/21/2021 Date Inactivated: 02/11/2021 Target Resolution Date: 01/21/2021 Goal Status: Met Ulcer/skin breakdown will have a volume reduction of 30% by week 4 Date Initiated: 01/21/2021 Target Resolution Date: 02/21/2021 Goal Status: Active Interventions: Assess ulceration(s) every visit Treatment Activities: Skin care regimen initiated : 01/21/2021 Topical wound management initiated : 01/21/2021 Notes: Electronic Signature(s) Signed: 03/04/2021 5:03:13 PM By: Gretta Cool, BSN, RN, CWS, Kim RN, BSN Entered By: Gretta Cool, BSN, RN, CWS, Kim on 03/04/2021 15:11:35 Craig [name] ([id number]) -------------------------------------------------------------------------------- Pain Assessment Details Patient Name: Craig [name]. Date of Service: 03/04/2021 2:15 PM Medical Record Number: [id number] Patient Account Number: [id number] Date of Birth/Sex: [birth year] (86 y.o. M) Treating RN: Cornell Barman Primary Care Crixus Mcaulay: Cyndi Bender Other Clinician: Referring Neosha Switalski: Cyndi Bender Treating Ericah Scotto/Extender: Skipper Cliche in Treatment: 6 Active Problems Location of Pain Severity and Description of Pain Patient Has Paino No Site Locations Pain Management and Medication Current Pain Management: Notes patient denies pain at this time Electronic Signature(s) Signed: 03/04/2021 5:03:13 PM By: Gretta Cool, BSN, RN, CWS, Kim RN, BSN Entered By: Gretta Cool, BSN, RN, CWS, Kim on 03/04/2021 14:32:56 Craig [name] ([id number]) -------------------------------------------------------------------------------- Patient/Caregiver Education Details Patient Name: Craig [name]. Date of Service: 03/04/2021 2:15 PM Medical Record Number: [id number] Patient Account Number: [id number] Date of Birth/Gender: 02-16-1935  (86 y.o. M) Treating RN: Cornell Barman Primary Care Physician: Cyndi Bender Other Clinician: Referring Physician: Cyndi Bender Treating Physician/Extender: Skipper Cliche in Treatment: 6 Education Assessment Education Provided To: Patient Education Topics Provided Wound/Skin Impairment: Handouts: Caring for Your Ulcer Methods: Demonstration, Explain/Verbal Responses: State content correctly Electronic Signature(s) Signed: 03/04/2021 5:03:13 PM By: Gretta Cool, BSN, RN, CWS, Kim RN, BSN Entered By: Gretta Cool, BSN, RN, CWS, Kim on 03/04/2021 15:14:25 Craig [name] ([id number]) -------------------------------------------------------------------------------- Wound Assessment Details Patient Name: Craig [name]. Date of Service: 03/04/2021 2:15 PM Medical Record Number: [id number] Patient Account Number: [id number] Date of Birth/Sex: 10/07/1934 (86 y.o. M) Treating RN: Cornell Barman Primary Care Andry Bogden: Cyndi Bender Other Clinician: Referring Tawnia Schirm: Cyndi Bender Treating Zhara Gieske/Extender: Jeri Cos Weeks in Treatment: 6 Wound Status Wound Number: 1 Primary 3rd degree Burn Etiology: Wound Location: Back Wound Status: Open Wounding Event: Thermal Burn Comorbid Coronary Artery Disease, Myocardial Infarction, Type II Date Acquired: 06/06/2019 History: Diabetes, History of Burn Weeks Of Treatment: 6 Clustered Wound: No Photos Wound Measurements Length: (cm) 5 Width: (cm) 9 Depth: (cm) 0.1 Area: (cm) 35.343 Volume: (cm) 3.534 % Reduction in Area: 90% % Reduction in Volume: 90% Epithelialization: None Tunneling: No  Undermining: No Wound Description Classification: Full Thickness Without Exposed Support Structures Exudate Amount: Large Exudate Type: Serous Exudate Color: amber Foul Odor After Cleansing: No Slough/Fibrino No Wound Bed Granulation Amount: Large (67-100%) Exposed Structure Granulation Quality: Red, Friable Fascia Exposed: No Necrotic  Amount: None Present (0%) Fat Layer (Subcutaneous Tissue) Exposed: Yes Tendon Exposed: No Muscle Exposed: No Joint Exposed: No Bone Exposed: No Treatment Notes Wound #1 (Back) Cleanser Peri-Wound Care Topical Primary Dressing KAIDENCE, CALLAWAY. ([id number]) Silflex Contact Layer dressing, 5x6 (in/in) Secondary Dressing Hydrofera Blue Ready Transfer Foam, 4x5 (in/in) Discharge Instruction: Apply to wound bed over non-stick dressing. Secured With 74M Medipore H Soft Cloth Surgical Tape, 2x2 (in/yd) Discharge Instruction: Make sure tape is outside of newly healed skin outside the wound boundaries-just use enough to hold in place ABD Pad 5X9 Compression Wrap Compression Stockings Add-Ons Electronic Signature(s) Signed: 03/04/2021 5:03:13 PM By: Gretta Cool, BSN, RN, CWS, Kim RN, BSN Entered By: Gretta Cool, BSN, RN, CWS, Kim on 03/04/2021 14:40:43 Craig [name] ([id number]) -------------------------------------------------------------------------------- [place] Details Patient Name: Craig [name]. Date of Service: 03/04/2021 2:15 PM Medical Record Number: [id number] Patient Account Number: [id number] Date of Birth/Sex: 08-18-1934 (86 y.o. M) Treating RN: Cornell Barman Primary Care Briarrose Shor: Cyndi Bender Other Clinician: Referring Branko Steeves: Cyndi Bender Treating Trevionne Advani/Extender: Skipper Cliche in Treatment: 6 Vital Signs Time Taken: 14:32 Temperature (F): 98.4 Height (in): 69 Pulse (bpm): 79 Weight (lbs): 168 Respiratory Rate (breaths/min): 16 Body Mass Index (BMI): 24.8 Blood Pressure (mmHg): 172/68 Reference Range: 80 - 120 mg / dl Electronic Signature(s) Signed: 03/04/2021 5:03:13 PM By: Gretta Cool, BSN, RN, CWS, Kim RN, BSN Entered By: Gretta Cool, BSN, RN, CWS, Kim on 03/04/2021 14:32:39

## 2021-03-11 ENCOUNTER — Encounter: Payer: Medicare Other | Attending: Physician Assistant | Admitting: Physician Assistant

## 2021-03-11 ENCOUNTER — Other Ambulatory Visit: Payer: Self-pay

## 2021-03-11 DIAGNOSIS — E1151 Type 2 diabetes mellitus with diabetic peripheral angiopathy without gangrene: Secondary | ICD-10-CM | POA: Diagnosis not present

## 2021-03-11 DIAGNOSIS — T2133XA Burn of third degree of upper back, initial encounter: Secondary | ICD-10-CM | POA: Diagnosis not present

## 2021-03-11 DIAGNOSIS — T2134XA Burn of third degree of lower back, initial encounter: Secondary | ICD-10-CM | POA: Insufficient documentation

## 2021-03-11 DIAGNOSIS — I2510 Atherosclerotic heart disease of native coronary artery without angina pectoris: Secondary | ICD-10-CM | POA: Diagnosis not present
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
## 2021-03-11 NOTE — Progress Notes (Signed)
[name], CANDY ([id number]) Visit Report for 03/11/2021 Arrival Information Details Patient Name: Craig [name], Craig [name]. Date of Service: 03/11/2021 2:15 PM Medical Record Number: [id number] Patient Account Number: [id number] Date of Birth/Sex: Apr 02, 1935 (86 y.o. M) Treating RN: Cornell Barman Primary Care Jillann Charette: Cyndi Bender Other Clinician: Levora Dredge Referring Gurnoor Ursua: Cyndi Bender Treating Mindee Robledo/Extender: Skipper Cliche in Treatment: 7 Visit Information History Since Last Visit Added or deleted any medications: No Patient Arrived: Ambulatory Has Dressing in Place as Prescribed: Yes Arrival Time: 14:20 Pain Present Now: No Accompanied By: daughter Transfer Assistance: None Patient Identification Verified: Yes Secondary Verification Process Completed: Yes Patient Has Alerts: Yes Patient Alerts: Type II Diabetic- diet controlled Electronic Signature(s) Signed: 03/11/2021 3:50:05 PM By: Gretta Cool, BSN, RN, CWS, Kim RN, BSN Entered By: Gretta Cool, BSN, RN, CWS, Kim on 03/11/2021 14:30:17 Craig [name] ([id number]) -------------------------------------------------------------------------------- Clinic Level of Care Assessment Details Patient Name: Craig [name]. Date of Service: 03/11/2021 2:15 PM Medical Record Number: [id number] Patient Account Number: [id number] Date of Birth/Sex: 1934/10/21 (86 y.o. M) Treating RN: Cornell Barman Primary Care Bradford Cazier: Cyndi Bender Other Clinician: Levora Dredge Referring Lexxie Winberg: Cyndi Bender Treating Hamdi Kley/Extender: Skipper Cliche in Treatment: 7 Clinic Level of Care Assessment Items TOOL 4 Quantity Score X - Use when only an EandM is performed on FOLLOW-UP visit 1 0 ASSESSMENTS - Nursing Assessment / Reassessment []  - Reassessment of Co-morbidities (includes updates in patient status) 0 []  - 0 Reassessment of Adherence to Treatment Plan ASSESSMENTS - Wound and Skin Assessment / Reassessment X - Simple  Wound Assessment / Reassessment - one wound 1 5 []  - 0 Complex Wound Assessment / Reassessment - multiple wounds []  - 0 Dermatologic / Skin Assessment (not related to wound area) ASSESSMENTS - Focused Assessment []  - Circumferential Edema Measurements - multi extremities 0 []  - 0 Nutritional Assessment / Counseling / Intervention []  - 0 Lower Extremity Assessment (monofilament, tuning fork, pulses) []  - 0 Peripheral Arterial Disease Assessment (using hand held doppler) ASSESSMENTS - Ostomy and/or Continence Assessment and Care []  - Incontinence Assessment and Management 0 []  - 0 Ostomy Care Assessment and Management (repouching, etc.) PROCESS - Coordination of Care X - Simple Patient / Family Education for ongoing care 1 15 []  - 0 Complex (extensive) Patient / Family Education for ongoing care []  - 0 Staff obtains Programmer, systems, Records, Test Results / Process Orders []  - 0 Staff telephones HHA, Nursing Homes / Clarify orders / etc []  - 0 Routine Transfer to another Facility (non-emergent condition) []  - 0 Routine Hospital Admission (non-emergent condition) []  - 0 New Admissions / Biomedical engineer / Ordering NPWT, Apligraf, etc. []  - 0 Emergency Hospital Admission (emergent condition) []  - 0 Simple Discharge Coordination []  - 0 Complex (extensive) Discharge Coordination PROCESS - Special Needs []  - Pediatric / Minor Patient Management 0 []  - 0 Isolation Patient Management []  - 0 Hearing / Language / Visual special needs []  - 0 Assessment of Community assistance (transportation, D/C planning, etc.) []  - 0 Additional assistance / Altered mentation []  - 0 Support Surface(s) Assessment (bed, cushion, seat, etc.) INTERVENTIONS - Wound Cleansing / Measurement Craig [name], PEREZPEREZ. ([id number]) X- 1 5 Simple Wound Cleansing - one wound []  - 0 Complex Wound Cleansing - multiple wounds X- 1 5 Wound Imaging (photographs - any number of wounds) []  - 0 Wound Tracing  (instead of photographs) X- 1 5 Simple Wound Measurement - one wound []  - 0 Complex Wound Measurement - multiple wounds INTERVENTIONS - Wound Dressings X -  Small Wound Dressing one or multiple wounds 1 10 []  - 0 Medium Wound Dressing one or multiple wounds []  - 0 Large Wound Dressing one or multiple wounds []  - 0 Application of Medications - topical []  - 0 Application of Medications - injection INTERVENTIONS - Miscellaneous []  - External ear exam 0 []  - 0 Specimen Collection (cultures, biopsies, blood, body fluids, etc.) []  - 0 Specimen(s) / Culture(s) sent or taken to Lab for analysis []  - 0 Patient Transfer (multiple staff / Civil Service fast streamer / Similar devices) []  - 0 Simple Staple / Suture removal (25 or less) []  - 0 Complex Staple / Suture removal (26 or more) []  - 0 Hypo / Hyperglycemic Management (close monitor of Blood Glucose) []  - 0 Ankle / Brachial Index (ABI) - do not check if billed separately []  - 0 Vital Signs Has the patient been seen at the hospital within the last three years: Yes Total Score: 45 Level Of Care: New/Established - Level 2 Electronic Signature(s) Signed: 03/11/2021 3:50:05 PM By: Gretta Cool, BSN, RN, CWS, Kim RN, BSN Entered By: Gretta Cool, BSN, RN, CWS, Kim on 03/11/2021 14:48:12 Craig [name] ([id number]) -------------------------------------------------------------------------------- Encounter Discharge Information Details Patient Name: Craig [name]. Date of Service: 03/11/2021 2:15 PM Medical Record Number: [id number] Patient Account Number: [id number] Date of Birth/Sex: [birth year] (86 y.o. M) Treating RN: Cornell Barman Primary Care Theotis Gerdeman: Cyndi Bender Other Clinician: Levora Dredge Referring Nymir Ringler: Cyndi Bender Treating Timonthy Hovater/Extender: Skipper Cliche in Treatment: 7 Encounter Discharge Information Items Discharge Condition: Stable Ambulatory Status: Ambulatory Discharge Destination: Home Transportation: Private  Auto Accompanied By: daughter Schedule Follow-up Appointment: Yes Clinical Summary of Care: Electronic Signature(s) Signed: 03/11/2021 3:50:05 PM By: Gretta Cool, BSN, RN, CWS, Kim RN, BSN Entered By: Gretta Cool, BSN, RN, CWS, Kim on 03/11/2021 14:49:50 Craig [name] ([id number]) -------------------------------------------------------------------------------- Lower Extremity Assessment Details Patient Name: Craig [name]. Date of Service: 03/11/2021 2:15 PM Medical Record Number: [id number] Patient Account Number: [id number] Date of Birth/Sex: [birth year] (86 y.o. M) Treating RN: Cornell Barman Primary Care Lyndy Russman: Cyndi Bender Other Clinician: Levora Dredge Referring Janos Shampine: Cyndi Bender Treating Auden Wettstein/Extender: Skipper Cliche in Treatment: 7 Electronic Signature(s) Signed: 03/11/2021 3:50:05 PM By: Gretta Cool, BSN, RN, CWS, Kim RN, BSN Entered By: Gretta Cool, BSN, RN, CWS, Kim on 03/11/2021 14:38:36 Craig [name] ([id number]) -------------------------------------------------------------------------------- Multi Wound Chart Details Patient Name: Craig [name]. Date of Service: 03/11/2021 2:15 PM Medical Record Number: [id number] Patient Account Number: [id number] Date of Birth/Sex: 01/01/1935 (86 y.o. M) Treating RN: Cornell Barman Primary Care Adriane Guglielmo: Cyndi Bender Other Clinician: Levora Dredge Referring Josalyn Dettmann: Cyndi Bender Treating Danarius Mcconathy/Extender: Jeri Cos Weeks in Treatment: 7 Vital Signs Height(in): 69 Pulse(bpm): 51 Weight(lbs): 168 Blood Pressure(mmHg): 151/78 Body Mass Index(BMI): 25 Temperature(F): 98.1 Respiratory Rate(breaths/min): 16 Photos: [N/A:N/A] Wound Location: Back N/A N/A Wounding Event: Thermal Burn N/A N/A Primary Etiology: 3rd degree Burn N/A N/A Comorbid History: Coronary Artery Disease, N/A N/A Myocardial Infarction, Type II Diabetes, History of Burn Date Acquired: 06/06/2019 N/A N/A Weeks of Treatment: 7 N/A N/A Wound  Status: Open N/A N/A Measurements L x W x D (cm) 4.5x9x0.1 N/A N/A Area (cm) : 31.809 N/A N/A Volume (cm) : 3.181 N/A N/A % Reduction in Area: 91.00% N/A N/A % Reduction in Volume: 91.00% N/A N/A Classification: Full Thickness Without Exposed N/A N/A Support Structures Exudate Amount: Large N/A N/A Exudate Type: Serous N/A N/A Exudate Color: amber N/A N/A Granulation Amount: Large (67-100%) N/A N/A Granulation Quality: Red, Friable N/A N/A Necrotic Amount: None Present (0%) N/A N/A  Exposed Structures: Fat Layer (Subcutaneous Tissue): N/A N/A Yes Fascia: No Tendon: No Muscle: No Joint: No Bone: No Epithelialization: None N/A N/A Treatment Notes Electronic Signature(s) Signed: 03/11/2021 3:50:05 PM By: Gretta Cool, BSN, RN, CWS, Kim RN, BSN Entered By: Gretta Cool, BSN, RN, CWS, Kim on 03/11/2021 14:43:05 Craig [name] ([id number]) -------------------------------------------------------------------------------- [place] Details Patient Name: Craig [name], Craig [name]. Date of Service: 03/11/2021 2:15 PM Medical Record Number: [id number] Patient Account Number: [id number] Date of Birth/Sex: 1934-06-06 (86 y.o. M) Treating RN: Cornell Barman Primary Care Bell Cai: Cyndi Bender Other Clinician: Levora Dredge Referring Zaila Crew: Cyndi Bender Treating Malvika Tung/Extender: Skipper Cliche in Treatment: 7 Active Inactive Pain, Acute or Chronic Nursing Diagnoses: Pain Management - Non-cyclic Acute (Procedural) Goals: Patient will verbalize adequate pain control and receive pain control interventions during procedures as needed Date Initiated: 01/21/2021 Target Resolution Date: 01/21/2021 Goal Status: Active Patient/caregiver will verbalize comfort level met Date Initiated: 01/21/2021 Target Resolution Date: 01/21/2021 Goal Status: Active Interventions: Encourage patient to take pain medications as prescribed Provision of support: recognize patient pain, provide  comfort and support as needed Reposition patient for comfort Treatment Activities: Administer pain control measures as ordered : 01/21/2021 Notes: Wound/Skin Impairment Nursing Diagnoses: Impaired tissue integrity Goals: Patient/caregiver will verbalize understanding of skin care regimen Date Initiated: 01/21/2021 Date Inactivated: 02/11/2021 Target Resolution Date: 01/21/2021 Goal Status: Met Ulcer/skin breakdown will have a volume reduction of 30% by week 4 Date Initiated: 01/21/2021 Target Resolution Date: 02/21/2021 Goal Status: Active Interventions: Assess ulceration(s) every visit Treatment Activities: Skin care regimen initiated : 01/21/2021 Topical wound management initiated : 01/21/2021 Notes: Electronic Signature(s) Signed: 03/11/2021 3:50:05 PM By: Gretta Cool, BSN, RN, CWS, Kim RN, BSN Entered By: Gretta Cool, BSN, RN, CWS, Kim on 03/11/2021 14:42:48 Craig [name] ([id number]) -------------------------------------------------------------------------------- Pain Assessment Details Patient Name: Craig [name]. Date of Service: 03/11/2021 2:15 PM Medical Record Number: [id number] Patient Account Number: [id number] Date of Birth/Sex: 20-Apr-1934 (86 y.o. M) Treating RN: Cornell Barman Primary Care Nyelli Samara: Cyndi Bender Other Clinician: Levora Dredge Referring Kaysi Ourada: Cyndi Bender Treating Annitta Fifield/Extender: Skipper Cliche in Treatment: 7 Active Problems Location of Pain Severity and Description of Pain Patient Has Paino No Site Locations Pain Management and Medication Current Pain Management: Notes patient denies pain at this time Electronic Signature(s) Signed: 03/11/2021 3:50:05 PM By: Gretta Cool, BSN, RN, CWS, Kim RN, BSN Entered By: Gretta Cool, BSN, RN, CWS, Kim on 03/11/2021 14:31:13 Craig [name] ([id number]) -------------------------------------------------------------------------------- Patient/Caregiver Education Details Patient Name: Craig [name]. Date of Service: 03/11/2021 2:15 PM Medical Record Number: [id number] Patient Account Number: [id number] Date of Birth/Gender: 26-Aug-1934 (86 y.o. M) Treating RN: Cornell Barman Primary Care Physician: Cyndi Bender Other Clinician: Levora Dredge Referring Physician: Cyndi Bender Treating Physician/Extender: Skipper Cliche in Treatment: 7 Education Assessment Education Provided To: Patient Education Topics Provided Wound/Skin Impairment: Handouts: Caring for Your Ulcer Methods: Explain/Verbal Responses: State content correctly Electronic Signature(s) Signed: 03/11/2021 3:50:05 PM By: Gretta Cool, BSN, RN, CWS, Kim RN, BSN Entered By: Gretta Cool, BSN, RN, CWS, Kim on 03/11/2021 14:48:58 Craig [name] ([id number]) -------------------------------------------------------------------------------- Wound Assessment Details Patient Name: Craig [name]. Date of Service: 03/11/2021 2:15 PM Medical Record Number: [id number] Patient Account Number: [id number] Date of Birth/Sex: 1934-10-28 (86 y.o. M) Treating RN: Cornell Barman Primary Care Avian Greenawalt: Cyndi Bender Other Clinician: Levora Dredge Referring Jenay Morici: Cyndi Bender Treating Katianne Barre/Extender: Jeri Cos Weeks in Treatment: 7 Wound Status Wound Number: 1 Primary 3rd degree Burn Etiology: Wound Location: Back Wound Status: Open Wounding Event: Thermal Burn Comorbid Coronary Artery Disease, Myocardial  Infarction, Type II Date Acquired: 06/06/2019 History: Diabetes, History of Burn Weeks Of Treatment: 7 Clustered Wound: No Photos Wound Measurements Length: (cm) 4.5 Width: (cm) 9 Depth: (cm) 0.1 Area: (cm) 31.809 Volume: (cm) 3.181 % Reduction in Area: 91% % Reduction in Volume: 91% Epithelialization: None Tunneling: No Undermining: No Wound Description Classification: Full Thickness Without Exposed Support Structu Exudate Amount: Large Exudate Type: Serous Exudate Color: amber res Foul Odor After  Cleansing: No Slough/Fibrino No Wound Bed Granulation Amount: Large (67-100%) Exposed Structure Granulation Quality: Red, Friable Fascia Exposed: No Necrotic Amount: None Present (0%) Fat Layer (Subcutaneous Tissue) Exposed: Yes Tendon Exposed: No Muscle Exposed: No Joint Exposed: No Bone Exposed: No Treatment Notes Wound #1 (Back) Cleanser Peri-Wound Care AandD Ointment Discharge Instruction: Apply AandD Ointment skin around Craig [name], Craig [name] ([id number]) Topical Primary Dressing Secondary Dressing Hydrofera Blue Ready Transfer Foam, 4x5 (in/in) Discharge Instruction: Apply to wound bed over non-stick dressing. Secured With 54M Medipore H Soft Cloth Surgical Tape, 2x2 (in/yd) Discharge Instruction: Make sure tape is outside of newly healed skin outside the wound boundaries-just use enough to hold in place ABD Pad 5X9 Compression Wrap Compression Stockings Add-Ons Electronic Signature(s) Signed: 03/11/2021 3:50:05 PM By: Gretta Cool, BSN, RN, CWS, Kim RN, BSN Entered By: Gretta Cool, BSN, RN, CWS, Kim on 03/11/2021 14:38:14 Craig [name] ([id number]) -------------------------------------------------------------------------------- [place] Details Patient Name: Craig [name]. Date of Service: 03/11/2021 2:15 PM Medical Record Number: [id number] Patient Account Number: [id number] Date of Birth/Sex: January 30, 1935 (86 y.o. M) Treating RN: Cornell Barman Primary Care Krystena Reitter: Cyndi Bender Other Clinician: Levora Dredge Referring Yaileen Hofferber: Cyndi Bender Treating Olan Kurek/Extender: Skipper Cliche in Treatment: 7 Vital Signs Time Taken: 14:30 Temperature (F): 98.1 Height (in): 69 Pulse (bpm): 77 Weight (lbs): 168 Respiratory Rate (breaths/min): 16 Body Mass Index (BMI): 24.8 Blood Pressure (mmHg): 151/78 Reference Range: 80 - 120 mg / dl Electronic Signature(s) Signed: 03/11/2021 3:50:05 PM By: Gretta Cool, BSN, RN, CWS, Kim RN, BSN Entered By: Gretta Cool, BSN, RN, CWS, Kim on  03/11/2021 14:31:00

## 2021-03-12 DIAGNOSIS — T2133XA Burn of third degree of upper back, initial encounter: Secondary | ICD-10-CM | POA: Diagnosis not present

## 2021-03-12 DIAGNOSIS — T2134XA Burn of third degree of lower back, initial encounter: Secondary | ICD-10-CM | POA: Diagnosis not present

## 2021-03-13 ENCOUNTER — Other Ambulatory Visit: Payer: Self-pay

## 2021-03-13 ENCOUNTER — Ambulatory Visit
Payer: Medicare Other | Attending: Student in an Organized Health Care Education/Training Program | Admitting: Student in an Organized Health Care Education/Training Program

## 2021-03-13 ENCOUNTER — Encounter: Payer: Self-pay | Admitting: Student in an Organized Health Care Education/Training Program

## 2021-03-13 VITALS — BP 165/84 | HR 75 | Temp 97.1°F | Resp 18 | Ht 69.0 in | Wt 160.0 lb

## 2021-03-13 DIAGNOSIS — M792 Neuralgia and neuritis, unspecified: Secondary | ICD-10-CM | POA: Insufficient documentation

## 2021-03-13 DIAGNOSIS — T3111 Burns involving 10-19% of body surface with 10-19% third degree burns: Secondary | ICD-10-CM | POA: Diagnosis not present

## 2021-03-13 DIAGNOSIS — Z79891 Long term (current) use of opiate analgesic: Secondary | ICD-10-CM | POA: Insufficient documentation

## 2021-03-13 DIAGNOSIS — M546 Pain in thoracic spine: Secondary | ICD-10-CM | POA: Diagnosis not present

## 2021-03-13 DIAGNOSIS — G894 Chronic pain syndrome: Secondary | ICD-10-CM | POA: Insufficient documentation

## 2021-03-13 DIAGNOSIS — G8929 Other chronic pain: Secondary | ICD-10-CM | POA: Diagnosis not present

## 2021-03-13 DIAGNOSIS — Z0289 Encounter for other administrative examinations: Secondary | ICD-10-CM | POA: Diagnosis not present

## 2021-03-13 MED ORDER — TRAMADOL HCL 50 MG PO TABS: 50.0000 mg | ORAL_TABLET | Freq: Three times a day (TID) | ORAL | 2 refills | Status: AC | PRN

## 2021-03-13 MED ORDER — GABAPENTIN 100 MG PO CAPS: 100.0000 mg | ORAL_CAPSULE | Freq: Three times a day (TID) | ORAL | 5 refills | Status: DC

## 2021-03-13 NOTE — Progress Notes (Signed)
Nursing Pain Medication Assessment:  Safety precautions to be maintained throughout the outpatient stay will include: orient to surroundings, keep bed in low position, maintain call bell within reach at all times, provide assistance with transfer out of bed and ambulation.  Medication Inspection Compliance: Pill count conducted under aseptic conditions, in front of the patient. Neither the pills nor the bottle was removed from the patient's sight at any time. Once count was completed pills were immediately returned to the patient in their original bottle.  Medication: Tramadol (Ultram)50 Pill/Patch Count:  44 of 60 pills remain Pill/Patch Appearance: Markings consistent with prescribed medication Bottle Appearance: Standard pharmacy container. Clearly labeled. Filled Date: 6 / 22 / 2022 Last Medication intake:  Today  Tramadol 100mg   19/30 Filled 01-20-2021
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
## 2021-03-18 ENCOUNTER — Encounter: Payer: Medicare Other | Admitting: Physician Assistant

## 2021-03-18 ENCOUNTER — Other Ambulatory Visit: Payer: Self-pay

## 2021-03-18 DIAGNOSIS — T2134XA Burn of third degree of lower back, initial encounter: Secondary | ICD-10-CM | POA: Diagnosis not present

## 2021-03-18 DIAGNOSIS — T2133XA Burn of third degree of upper back, initial encounter: Secondary | ICD-10-CM | POA: Diagnosis not present

## 2021-03-18 DIAGNOSIS — E1151 Type 2 diabetes mellitus with diabetic peripheral angiopathy without gangrene: Secondary | ICD-10-CM | POA: Diagnosis not present

## 2021-03-18 DIAGNOSIS — I2510 Atherosclerotic heart disease of native coronary artery without angina pectoris: Secondary | ICD-10-CM | POA: Diagnosis not present
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
## 2021-03-18 NOTE — Progress Notes (Addendum)
PERSEUS, WESTALL ([id number]) Visit Report for 03/18/2021 Arrival Information Details Patient Name: Craig [name], Craig [name]. Date of Service: 03/18/2021 2:15 PM Medical Record Number: [id number] Patient Account Number: [id number] Date of Birth/Sex: 09/05/1934 (86 y.o. M) Treating RN: Cornell Barman Primary Care Aubrei Bouchie: Cyndi Bender Other Clinician: Referring Chinedu Agustin: Cyndi Bender Treating Aryahna Spagna/Extender: Skipper Cliche in Treatment: 8 Visit Information History Since Last Visit Added or deleted any medications: No Patient Arrived: Ambulatory Has Dressing in Place as Prescribed: Yes Arrival Time: 14:58 Pain Present Now: No Accompanied By: daughter Transfer Assistance: None Patient Identification Verified: Yes Secondary Verification Process Completed: Yes Patient Has Alerts: Yes Electronic Signature(s) Signed: 03/18/2021 5:36:28 PM By: Gretta Cool, BSN, RN, CWS, Kim RN, BSN Entered By: Gretta Cool, BSN, RN, CWS, Kim on 03/18/2021 14:58:46 Craig [name] ([id number]) -------------------------------------------------------------------------------- Encounter Discharge Information Details Patient Name: Craig [name]. Date of Service: 03/18/2021 2:15 PM Medical Record Number: [id number] Patient Account Number: [id number] Date of Birth/Sex: Jun 15, 1934 (86 y.o. M) Treating RN: Cornell Barman Primary Care Harvin Konicek: Cyndi Bender Other Clinician: Referring Melesio Madara: Cyndi Bender Treating Tekila Caillouet/Extender: Skipper Cliche in Treatment: 8 Encounter Discharge Information Items Discharge Condition: Stable Ambulatory Status: Ambulatory Discharge Destination: Home Transportation: Private Auto Accompanied By: daughter Schedule Follow-up Appointment: Yes Clinical Summary of Care: Electronic Signature(s) Signed: 03/18/2021 4:53:41 PM By: Gretta Cool, BSN, RN, CWS, Kim RN, BSN Entered By: Gretta Cool, BSN, RN, CWS, Kim on 03/18/2021 16:53:41 Craig [name]  ([id number]) -------------------------------------------------------------------------------- Lower Extremity Assessment Details Patient Name: Craig [name]. Date of Service: 03/18/2021 2:15 PM Medical Record Number: [id number] Patient Account Number: [id number] Date of Birth/Sex: 1934/11/04 (86 y.o. M) Treating RN: Cornell Barman Primary Care Crystol Walpole: Cyndi Bender Other Clinician: Referring Adrieanna Boteler: Cyndi Bender Treating Betzabeth Derringer/Extender: Skipper Cliche in Treatment: 8 Electronic Signature(s) Signed: 03/18/2021 5:36:28 PM By: Gretta Cool, BSN, RN, CWS, Kim RN, BSN Entered By: Gretta Cool, BSN, RN, CWS, Kim on 03/18/2021 15:16:21 Craig [name] ([id number]) -------------------------------------------------------------------------------- Multi Wound Chart Details Patient Name: Craig [name]. Date of Service: 03/18/2021 2:15 PM Medical Record Number: [id number] Patient Account Number: [id number] Date of Birth/Sex: 11/11/1934 (86 y.o. M) Treating RN: Cornell Barman Primary Care Rowen Wilmer: Cyndi Bender Other Clinician: Referring Raquel Racey: Cyndi Bender Treating Sameka Bagent/Extender: Skipper Cliche in Treatment: 8 Vital Signs Height(in): 69 Pulse(bpm): 74 Weight(lbs): 168 Blood Pressure(mmHg): 178/80 Body Mass Index(BMI): 25 Temperature(F): 98.5 Respiratory Rate(breaths/min): 16 Photos: [1:No Photos] [N/A:N/A] Wound Location: [1:Back] [N/A:N/A] Wounding Event: [1:Thermal Burn] [N/A:N/A] Primary Etiology: [1:3rd degree Burn] [N/A:N/A] Comorbid History: [1:Coronary Artery Disease, Myocardial Infarction, Type II Diabetes, History of Burn] [N/A:N/A] Date Acquired: [1:06/06/2019] [N/A:N/A] Weeks of Treatment: [1:8] [N/A:N/A] Wound Status: [1:Open] [N/A:N/A] Measurements L x W x D (cm) [1:4.5x9x0.1] [N/A:N/A] Area (cm) : [1:31.809] [N/A:N/A] Volume (cm) : [1:3.181] [N/A:N/A] % Reduction in Area: [1:91.00%] [N/A:N/A] % Reduction in Volume: [1:91.00%]  [N/A:N/A] Classification: [1:Full Thickness Without Exposed Support Structures] [N/A:N/A] Exudate Amount: [1:Large] [N/A:N/A] Exudate Type: [1:Sanguinous] [N/A:N/A] Exudate Color: [1:red] [N/A:N/A] Wound Margin: [1:Flat and Intact] [N/A:N/A] Granulation Amount: [1:Large (67-100%)] [N/A:N/A] Granulation Quality: [1:Red, Friable] [N/A:N/A] Necrotic Amount: [1:None Present (0%)] [N/A:N/A] Exposed Structures: [1:Fat Layer (Subcutaneous Tissue): Yes Fascia: No Tendon: No Muscle: No Joint: No Bone: No None] [N/A:N/A N/A] Treatment Notes Electronic Signature(s) Signed: 03/18/2021 5:36:28 PM By: Gretta Cool, BSN, RN, CWS, Kim RN, BSN Entered By: Gretta Cool, BSN, RN, CWS, Kim on 03/18/2021 15:27:21 Craig [name] ([id number]) -------------------------------------------------------------------------------- [place] Details Patient Name: Craig [name]. Date of Service: 03/18/2021 2:15 PM Medical Record Number: [id number] Patient Account Number: [id number] Date of Birth/Sex: 05-17-1934 (  86 y.o. M) Treating RN: Cornell Barman Primary Care Natayla Cadenhead: Cyndi Bender Other Clinician: Referring Madisin Hasan: Cyndi Bender Treating Autumnrose Yore/Extender: Skipper Cliche in Treatment: 8 Active Inactive Pain, Acute or Chronic Nursing Diagnoses: Pain Management - Non-cyclic Acute (Procedural) Goals: Patient will verbalize adequate pain control and receive pain control interventions during procedures as needed Date Initiated: 01/21/2021 Target Resolution Date: 01/21/2021 Goal Status: Active Patient/caregiver will verbalize comfort level met Date Initiated: 01/21/2021 Target Resolution Date: 01/21/2021 Goal Status: Active Interventions: Encourage patient to take pain medications as prescribed Provision of support: recognize patient pain, provide comfort and support as needed Reposition patient for comfort Treatment Activities: Administer pain control measures as ordered :  01/21/2021 Notes: Wound/Skin Impairment Nursing Diagnoses: Impaired tissue integrity Goals: Patient/caregiver will verbalize understanding of skin care regimen Date Initiated: 01/21/2021 Date Inactivated: 02/11/2021 Target Resolution Date: 01/21/2021 Goal Status: Met Ulcer/skin breakdown will have a volume reduction of 30% by week 4 Date Initiated: 01/21/2021 Target Resolution Date: 02/21/2021 Goal Status: Active Interventions: Assess ulceration(s) every visit Treatment Activities: Skin care regimen initiated : 01/21/2021 Topical wound management initiated : 01/21/2021 Notes: Electronic Signature(s) Signed: 03/18/2021 5:36:28 PM By: Gretta Cool, BSN, RN, CWS, Kim RN, BSN Entered By: Gretta Cool, BSN, RN, CWS, Kim on 03/18/2021 15:27:10 Craig [name] ([id number]) -------------------------------------------------------------------------------- Pain Assessment Details Patient Name: Craig [name]. Date of Service: 03/18/2021 2:15 PM Medical Record Number: [id number] Patient Account Number: [id number] Date of Birth/Sex: [birth year] (86 y.o. M) Treating RN: Cornell Barman Primary Care Hero Kulish: Cyndi Bender Other Clinician: Referring Aniel Hubble: Cyndi Bender Treating Naphtali Riede/Extender: Skipper Cliche in Treatment: 8 Active Problems Location of Pain Severity and Description of Pain Patient Has Paino No Site Locations Pain Management and Medication Current Pain Management: Notes patient denies pain at this time Electronic Signature(s) Signed: 03/18/2021 5:36:28 PM By: Gretta Cool, BSN, RN, CWS, Kim RN, BSN Entered By: Gretta Cool, BSN, RN, CWS, Kim on 03/18/2021 15:02:19 Craig [name] ([id number]) -------------------------------------------------------------------------------- Patient/Caregiver Education Details Patient Name: Craig [name]. Date of Service: 03/18/2021 2:15 PM Medical Record Number: [id number] Patient Account Number: [id number] Date of Birth/Gender: December 27, 1934  (86 y.o. M) Treating RN: Cornell Barman Primary Care Physician: Cyndi Bender Other Clinician: Referring Physician: Cyndi Bender Treating Physician/Extender: Skipper Cliche in Treatment: 8 Education Assessment Education Provided To: Patient Education Topics Provided Wound/Skin Impairment: Handouts: Caring for Your Ulcer Methods: Demonstration, Explain/Verbal Responses: State content correctly Electronic Signature(s) Signed: 03/18/2021 5:36:28 PM By: Gretta Cool, BSN, RN, CWS, Kim RN, BSN Entered By: Gretta Cool, BSN, RN, CWS, Kim on 03/18/2021 16:52:16 Craig [name] ([id number]) -------------------------------------------------------------------------------- Wound Assessment Details Patient Name: Craig [name]. Date of Service: 03/18/2021 2:15 PM Medical Record Number: [id number] Patient Account Number: [id number] Date of Birth/Sex: 24-Nov-1934 (86 y.o. M) Treating RN: Cornell Barman Primary Care Elveta Rape: Cyndi Bender Other Clinician: Referring Syra Sirmons: Cyndi Bender Treating Dorsie Burich/Extender: Jeri Cos Weeks in Treatment: 8 Wound Status Wound Number: 1 Primary 3rd degree Burn Etiology: Wound Location: Back Wound Status: Open Wounding Event: Thermal Burn Comorbid Coronary Artery Disease, Myocardial Infarction, Type II Date Acquired: 06/06/2019 History: Diabetes, History of Burn Weeks Of Treatment: 8 Clustered Wound: No Wound Measurements Length: (cm) 4.5 Width: (cm) 9 Depth: (cm) 0.1 Area: (cm) 31.809 Volume: (cm) 3.181 % Reduction in Area: 91% % Reduction in Volume: 91% Epithelialization: None Wound Description Classification: Full Thickness Without Exposed Support Structures Wound Margin: Flat and Intact Exudate Amount: Large Exudate Type: Sanguinous Exudate Color: red Foul Odor After Cleansing: No Slough/Fibrino No Wound Bed Granulation Amount: Large (67-100%) Exposed Structure Granulation Quality: Red,  Friable Fascia Exposed: No Necrotic Amount:  None Present (0%) Fat Layer (Subcutaneous Tissue) Exposed: Yes Tendon Exposed: No Muscle Exposed: No Joint Exposed: No Bone Exposed: No Treatment Notes Wound #1 (Back) Cleanser Peri-Wound Care AandD Ointment Discharge Instruction: Apply AandD Ointment skin around Topical Primary Dressing Cutimed Sorbact 1.5x 2.38 (in/in) Discharge Instruction: A bacteria- and fungi binding wound dressing, suitable for cavities and fistulas. It is suitable as a wound filler and allows the passage of wound exudate into a secondary dressing. The dressing helps reducing odor and pain and can improve healing. Secondary Dressing Secured With 59M Medipore H Soft Cloth Surgical Tape, 2x2 (in/yd) Discharge Instruction: Make sure tape is outside of newly healed skin outside the wound boundaries-just use enough to hold in place ABD Pad 5X9 RUHAN, BORAK ([id number]) Compression Wrap Compression Stockings Add-Ons Electronic Signature(s) Signed: 03/18/2021 5:36:28 PM By: Gretta Cool, BSN, RN, CWS, Kim RN, BSN Entered By: Gretta Cool, BSN, RN, CWS, Kim on 03/18/2021 15:14:02 Craig [name] ([id number]) -------------------------------------------------------------------------------- [place] Details Patient Name: Craig [name]. Date of Service: 03/18/2021 2:15 PM Medical Record Number: [id number] Patient Account Number: [id number] Date of Birth/Sex: Mar 19, 1935 (86 y.o. M) Treating RN: Cornell Barman Primary Care Ticara Waner: Cyndi Bender Other Clinician: Referring Kaleyah Labreck: Cyndi Bender Treating Nichollas Perusse/Extender: Skipper Cliche in Treatment: 8 Vital Signs Time Taken: 14:58 Temperature (F): 98.5 Height (in): 69 Pulse (bpm): 74 Weight (lbs): 168 Respiratory Rate (breaths/min): 16 Body Mass Index (BMI): 24.8 Blood Pressure (mmHg): 178/80 Reference Range: 80 - 120 mg / dl Electronic Signature(s) Signed: 03/18/2021 5:36:28 PM By: Gretta Cool, BSN, RN, CWS, Kim RN, BSN Entered By: Gretta Cool, BSN, RN, CWS,  Kim on 03/18/2021 15:02:06

## 2021-03-25 ENCOUNTER — Encounter: Payer: Medicare Other | Admitting: Physician Assistant

## 2021-03-25 ENCOUNTER — Other Ambulatory Visit: Payer: Self-pay

## 2021-03-25 DIAGNOSIS — E1151 Type 2 diabetes mellitus with diabetic peripheral angiopathy without gangrene: Secondary | ICD-10-CM | POA: Diagnosis not present

## 2021-03-25 DIAGNOSIS — I2510 Atherosclerotic heart disease of native coronary artery without angina pectoris: Secondary | ICD-10-CM | POA: Diagnosis not present

## 2021-03-25 DIAGNOSIS — T2134XA Burn of third degree of lower back, initial encounter: Secondary | ICD-10-CM | POA: Diagnosis not present

## 2021-03-25 DIAGNOSIS — T2133XA Burn of third degree of upper back, initial encounter: Secondary | ICD-10-CM | POA: Diagnosis not present
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
## 2021-03-26 NOTE — Progress Notes (Signed)
DINARI, STGERMAINE ([id number]) Visit Report for 03/25/2021 Arrival Information Details Patient Name: Craig [name], Craig [name]. Date of Service: 03/25/2021 2:15 PM Medical Record Number: [id number] Patient Account Number: [id number] Date of Birth/Sex: 1934-11-04 (86 y.o. M) Treating RN: Levora Dredge Primary Care Bracy Pepper: Cyndi Bender Other Clinician: Referring Alyiah Ulloa: Cyndi Bender Treating Yazlin Ekblad/Extender: Skipper Cliche in Treatment: 9 Visit Information History Since Last Visit Added or deleted any medications: No Patient Arrived: Ambulatory Any new allergies or adverse reactions: No Arrival Time: 14:16 Had a fall or experienced change in No Accompanied By: daughter activities of daily living that may affect Transfer Assistance: None risk of falls: Patient Identification Verified: Yes Hospitalized since last visit: No Secondary Verification Process Completed: Yes Has Dressing in Place as Prescribed: Yes Patient Has Alerts: Yes Pain Present Now: No Electronic Signature(s) Signed: 03/26/2021 12:56:33 PM By: Levora Dredge Entered By: Levora Dredge on 03/25/2021 14:16:49 [name] ([id number]) -------------------------------------------------------------------------------- Clinic Level of Care Assessment Details Patient Name: [name]. Date of Service: 03/25/2021 2:15 PM Medical Record Number: [id number] Patient Account Number: [id number] Date of Birth/Sex: [birth year] (86 y.o. M) Treating RN: Levora Dredge Primary Care Novie Maggio: Cyndi Bender Other Clinician: Referring Tannis Burstein: Cyndi Bender Treating Kadi Hession/Extender: Skipper Cliche in Treatment: 9 Clinic Level of Care Assessment Items TOOL 4 Quantity Score X - Use when only an EandM is performed on FOLLOW-UP visit 1 0 ASSESSMENTS - Nursing Assessment / Reassessment []  - Reassessment of Co-morbidities (includes updates in patient status) 0 []  - 0 Reassessment of Adherence to  Treatment Plan ASSESSMENTS - Wound and Skin Assessment / Reassessment X - Simple Wound Assessment / Reassessment - one wound 1 5 []  - 0 Complex Wound Assessment / Reassessment - multiple wounds []  - 0 Dermatologic / Skin Assessment (not related to wound area) ASSESSMENTS - Focused Assessment []  - Circumferential Edema Measurements - multi extremities 0 []  - 0 Nutritional Assessment / Counseling / Intervention []  - 0 Lower Extremity Assessment (monofilament, tuning fork, pulses) []  - 0 Peripheral Arterial Disease Assessment (using hand held doppler) ASSESSMENTS - Ostomy and/or Continence Assessment and Care []  - Incontinence Assessment and Management 0 []  - 0 Ostomy Care Assessment and Management (repouching, etc.) PROCESS - Coordination of Care X - Simple Patient / Family Education for ongoing care 1 15 []  - 0 Complex (extensive) Patient / Family Education for ongoing care []  - 0 Staff obtains Programmer, systems, Records, Test Results / Process Orders []  - 0 Staff telephones HHA, Nursing Homes / Clarify orders / etc []  - 0 Routine Transfer to another Facility (non-emergent condition) []  - 0 Routine Hospital Admission (non-emergent condition) []  - 0 New Admissions / Biomedical engineer / Ordering NPWT, Apligraf, etc. []  - 0 Emergency Hospital Admission (emergent condition) X- 1 10 Simple Discharge Coordination []  - 0 Complex (extensive) Discharge Coordination PROCESS - Special Needs []  - Pediatric / Minor Patient Management 0 []  - 0 Isolation Patient Management []  - 0 Hearing / Language / Visual special needs []  - 0 Assessment of Community assistance (transportation, D/C planning, etc.) []  - 0 Additional assistance / Altered mentation []  - 0 Support Surface(s) Assessment (bed, cushion, seat, etc.) INTERVENTIONS - Wound Cleansing / Measurement PARV, MANTHEY. ([id number]) X- 1 5 Simple Wound Cleansing - one wound []  - 0 Complex Wound Cleansing - multiple  wounds X- 1 5 Wound Imaging (photographs - any number of wounds) []  - 0 Wound Tracing (instead of photographs) X- 1 5 Simple Wound Measurement - one wound []  - 0 Complex  Wound Measurement - multiple wounds INTERVENTIONS - Wound Dressings X - Small Wound Dressing one or multiple wounds 1 10 []  - 0 Medium Wound Dressing one or multiple wounds []  - 0 Large Wound Dressing one or multiple wounds []  - 0 Application of Medications - topical []  - 0 Application of Medications - injection INTERVENTIONS - Miscellaneous []  - External ear exam 0 []  - 0 Specimen Collection (cultures, biopsies, blood, body fluids, etc.) []  - 0 Specimen(s) / Culture(s) sent or taken to Lab for analysis []  - 0 Patient Transfer (multiple staff / Civil Service fast streamer / Similar devices) []  - 0 Simple Staple / Suture removal (25 or less) []  - 0 Complex Staple / Suture removal (26 or more) []  - 0 Hypo / Hyperglycemic Management (close monitor of Blood Glucose) []  - 0 Ankle / Brachial Index (ABI) - do not check if billed separately X- 1 5 Vital Signs Has the patient been seen at the hospital within the last three years: Yes Total Score: 60 Level Of Care: New/Established - Level 2 Electronic Signature(s) Signed: 03/26/2021 12:56:33 PM By: Levora Dredge Entered By: Levora Dredge on 03/25/2021 15:04:38 [name] ([id number]) -------------------------------------------------------------------------------- Encounter Discharge Information Details Patient Name: [name]. Date of Service: 03/25/2021 2:15 PM Medical Record Number: [id number] Patient Account Number: [id number] Date of Birth/Sex: 11/23/1934 (86 y.o. M) Treating RN: Levora Dredge Primary Care Jachin Coury: Cyndi Bender Other Clinician: Referring Analissa Bayless: Cyndi Bender Treating Porcia Morganti/Extender: Skipper Cliche in Treatment: 9 Encounter Discharge Information Items Discharge Condition: Stable Ambulatory Status:  Ambulatory Discharge Destination: Home Transportation: Private Auto Accompanied By: daughters Schedule Follow-up Appointment: Yes Clinical Summary of Care: Electronic Signature(s) Signed: 03/26/2021 12:56:33 PM By: Levora Dredge Entered By: Levora Dredge on 03/25/2021 15:05:51 [name] ([id number]) -------------------------------------------------------------------------------- Lower Extremity Assessment Details Patient Name: [name]. Date of Service: 03/25/2021 2:15 PM Medical Record Number: [id number] Patient Account Number: [id number] Date of Birth/Sex: 02-08-1935 (86 y.o. M) Treating RN: Levora Dredge Primary Care Raynold Blankenbaker: Cyndi Bender Other Clinician: Referring Jelisha Weed: Cyndi Bender Treating Akul Leggette/Extender: Jeri Cos Weeks in Treatment: 9 Electronic Signature(s) Signed: 03/26/2021 12:56:33 PM By: Levora Dredge Entered By: Levora Dredge on 03/25/2021 14:27:52 [name] ([id number]) -------------------------------------------------------------------------------- Multi Wound Chart Details Patient Name: [name]. Date of Service: 03/25/2021 2:15 PM Medical Record Number: [id number] Patient Account Number: [id number] Date of Birth/Sex: 1934-12-26 (86 y.o. M) Treating RN: Levora Dredge Primary Care Lorielle Boehning: Cyndi Bender Other Clinician: Referring Littleton Haub: Cyndi Bender Treating Amaiah Cristiano/Extender: Skipper Cliche in Treatment: 9 Vital Signs Height(in): 69 Pulse(bpm): 44 Weight(lbs): 168 Blood Pressure(mmHg): 137/72 Body Mass Index(BMI): 25 Temperature(F): 98.3 Respiratory Rate(breaths/min): 18 Photos: [N/A:N/A] Wound Location: Back N/A N/A Wounding Event: Thermal Burn N/A N/A Primary Etiology: 3rd degree Burn N/A N/A Comorbid History: Coronary Artery Disease, N/A N/A Myocardial Infarction, Type II Diabetes, History of Burn Date Acquired: 06/06/2019 N/A N/A Weeks of Treatment: 9 N/A N/A Wound  Status: Open N/A N/A Measurements L x W x D (cm) 3.7x7.4x0.1 N/A N/A Area (cm) : 21.504 N/A N/A Volume (cm) : 2.15 N/A N/A % Reduction in Area: 93.90% N/A N/A % Reduction in Volume: 93.90% N/A N/A Classification: Full Thickness Without Exposed N/A N/A Support Structures Exudate Amount: Large N/A N/A Exudate Type: Serosanguineous N/A N/A Exudate Color: red, brown N/A N/A Wound Margin: Flat and Intact N/A N/A Granulation Amount: Large (67-100%) N/A N/A Granulation Quality: Red, Friable N/A N/A Necrotic Amount: None Present (0%) N/A N/A Exposed Structures: Fat Layer (Subcutaneous Tissue): N/A N/A Yes Fascia: No  Tendon: No Muscle: No Joint: No Bone: No Epithelialization: None N/A N/A Treatment Notes Electronic Signature(s) Signed: 03/26/2021 12:56:33 PM By: Levora Dredge Entered By: Levora Dredge on 03/25/2021 14:38:49 [name] ([id number]) -------------------------------------------------------------------------------- [place] Details Patient Name: [name]. Date of Service: 03/25/2021 2:15 PM Medical Record Number: [id number] Patient Account Number: [id number] Date of Birth/Sex: [birth year] (86 y.o. M) Treating RN: Levora Dredge Primary Care Kajsa Butrum: Cyndi Bender Other Clinician: Referring Ayvin Lipinski: Cyndi Bender Treating Cambrie Sonnenfeld/Extender: Skipper Cliche in Treatment: 9 Active Inactive Pain, Acute or Chronic Nursing Diagnoses: Pain Management - Non-cyclic Acute (Procedural) Goals: Patient will verbalize adequate pain control and receive pain control interventions during procedures as needed Date Initiated: 01/21/2021 Target Resolution Date: 01/21/2021 Goal Status: Active Patient/caregiver will verbalize comfort level met Date Initiated: 01/21/2021 Target Resolution Date: 01/21/2021 Goal Status: Active Interventions: Encourage patient to take pain medications as prescribed Provision of support: recognize  patient pain, provide comfort and support as needed Reposition patient for comfort Treatment Activities: Administer pain control measures as ordered : 01/21/2021 Notes: Wound/Skin Impairment Nursing Diagnoses: Impaired tissue integrity Goals: Patient/caregiver will verbalize understanding of skin care regimen Date Initiated: 01/21/2021 Date Inactivated: 02/11/2021 Target Resolution Date: 01/21/2021 Goal Status: Met Ulcer/skin breakdown will have a volume reduction of 30% by week 4 Date Initiated: 01/21/2021 Target Resolution Date: 02/21/2021 Goal Status: Active Interventions: Assess ulceration(s) every visit Treatment Activities: Skin care regimen initiated : 01/21/2021 Topical wound management initiated : 01/21/2021 Notes: Electronic Signature(s) Signed: 03/26/2021 12:56:33 PM By: Levora Dredge Entered By: Levora Dredge on 03/25/2021 14:38:35 [name] ([id number]) -------------------------------------------------------------------------------- Pain Assessment Details Patient Name: [name]. Date of Service: 03/25/2021 2:15 PM Medical Record Number: [id number] Patient Account Number: [id number] Date of Birth/Sex: 1934-04-29 (86 y.o. M) Treating RN: Levora Dredge Primary Care Ilya Neely: Cyndi Bender Other Clinician: Referring Greenly Rarick: Cyndi Bender Treating Kyshawn Teal/Extender: Skipper Cliche in Treatment: 9 Active Problems Location of Pain Severity and Description of Pain Patient Has Paino No Site Locations Rate the pain. Current Pain Level: 0 Pain Management and Medication Current Pain Management: Electronic Signature(s) Signed: 03/26/2021 12:56:33 PM By: Levora Dredge Entered By: Levora Dredge on 03/25/2021 14:17:21 [name] ([id number]) -------------------------------------------------------------------------------- Patient/Caregiver Education Details Patient Name: [name]. Date of Service: 03/25/2021  2:15 PM Medical Record Number: [id number] Patient Account Number: [id number] Date of Birth/Gender: 10/30/1934 (86 y.o. M) Treating RN: Levora Dredge Primary Care Physician: Cyndi Bender Other Clinician: Referring Physician: Cyndi Bender Treating Physician/Extender: Skipper Cliche in Treatment: 9 Education Assessment Education Provided To: Patient and Caregiver Education Topics Provided Wound/Skin Impairment: Handouts: Caring for Your Ulcer Methods: Explain/Verbal Responses: State content correctly Electronic Signature(s) Signed: 03/26/2021 12:56:33 PM By: Levora Dredge Entered By: Levora Dredge on 03/25/2021 15:05:01 [name] ([id number]) -------------------------------------------------------------------------------- Wound Assessment Details Patient Name: [name]. Date of Service: 03/25/2021 2:15 PM Medical Record Number: [id number] Patient Account Number: [id number] Date of Birth/Sex: 10-18-1934 (86 y.o. M) Treating RN: Levora Dredge Primary Care Zulay Corrie: Cyndi Bender Other Clinician: Referring Claudeen Leason: Cyndi Bender Treating Garen Woolbright/Extender: Jeri Cos Weeks in Treatment: 9 Wound Status Wound Number: 1 Primary 3rd degree Burn Etiology: Wound Location: Back Wound Status: Open Wounding Event: Thermal Burn Comorbid Coronary Artery Disease, Myocardial Infarction, Type II Date Acquired: 06/06/2019 History: Diabetes, History of Burn Weeks Of Treatment: 9 Clustered Wound: No Photos Wound Measurements Length: (cm) 3.7 Width: (cm) 7.4 Depth: (cm) 0.1 Area: (cm) 21.504 Volume: (cm) 2.15 % Reduction in Area: 93.9% % Reduction in Volume: 93.9% Epithelialization: None Tunneling:  No Undermining: No Wound Description Classification: Full Thickness Without Exposed Support Structu Wound Margin: Flat and Intact Exudate Amount: Large Exudate Type: Serosanguineous Exudate Color: red, brown res Foul Odor After Cleansing:  No Slough/Fibrino No Wound Bed Granulation Amount: Large (67-100%) Exposed Structure Granulation Quality: Red, Friable Fascia Exposed: No Necrotic Amount: None Present (0%) Fat Layer (Subcutaneous Tissue) Exposed: Yes Tendon Exposed: No Muscle Exposed: No Joint Exposed: No Bone Exposed: No Treatment Notes Wound #1 (Back) Cleanser Peri-Wound Care AandD Ointment Discharge Instruction: Apply AandD Ointment skin around VYOM, BRASS ([id number]) Topical Primary Dressing Cutimed Sorbact 1.5x 2.38 (in/in) Discharge Instruction: A bacteria- and fungi binding wound dressing, suitable for cavities and fistulas. It is suitable as a wound filler and allows the passage of wound exudate into a secondary dressing. The dressing helps reducing odor and pain and can improve healing. Secondary Dressing Secured With 31M Medipore H Soft Cloth Surgical Tape, 2x2 (in/yd) Discharge Instruction: Make sure tape is outside of newly healed skin outside the wound boundaries-just use enough to hold in place ABD Pad 5X9 Compression Wrap Compression Stockings Add-Ons Electronic Signature(s) Signed: 03/26/2021 12:56:33 PM By: Levora Dredge Entered By: Levora Dredge on 03/25/2021 14:27:19 [name] ([id number]) -------------------------------------------------------------------------------- Vitals Details Patient Name: [name]. Date of Service: 03/25/2021 2:15 PM Medical Record Number: [id number] Patient Account Number: [id number] Date of Birth/Sex: 1934-05-14 (86 y.o. M) Treating RN: Levora Dredge Primary Care Gwynneth Fabio: Cyndi Bender Other Clinician: Referring Issabella Rix: Cyndi Bender Treating Tanisa Lagace/Extender: Skipper Cliche in Treatment: 9 Vital Signs Time Taken: 14:10 Temperature (F): 98.3 Height (in): 69 Pulse (bpm): 84 Weight (lbs): 168 Respiratory Rate (breaths/min): 18 Body Mass Index (BMI): 24.8 Blood Pressure (mmHg): 137/72 Reference Range: 80 -  120 mg / dl Electronic Signature(s) Signed: 03/26/2021 12:56:33 PM By: Levora Dredge Entered By: Levora Dredge on 03/25/2021 14:17:11

## 2021-04-01 ENCOUNTER — Encounter: Payer: Medicare Other | Admitting: Internal Medicine

## 2021-04-02 DIAGNOSIS — R519 Headache, unspecified: Secondary | ICD-10-CM | POA: Diagnosis not present

## 2021-04-10 ENCOUNTER — Other Ambulatory Visit: Payer: Self-pay

## 2021-04-10 ENCOUNTER — Encounter: Payer: Medicare Other | Attending: Physician Assistant | Admitting: Physician Assistant

## 2021-04-10 DIAGNOSIS — I2510 Atherosclerotic heart disease of native coronary artery without angina pectoris: Secondary | ICD-10-CM | POA: Insufficient documentation

## 2021-04-10 DIAGNOSIS — T2133XA Burn of third degree of upper back, initial encounter: Secondary | ICD-10-CM | POA: Insufficient documentation

## 2021-04-10 DIAGNOSIS — E119 Type 2 diabetes mellitus without complications: Secondary | ICD-10-CM | POA: Insufficient documentation

## 2021-04-10 DIAGNOSIS — T2134XA Burn of third degree of lower back, initial encounter: Secondary | ICD-10-CM | POA: Insufficient documentation

## 2021-04-10 DIAGNOSIS — X58XXXA Exposure to other specified factors, initial encounter: Secondary | ICD-10-CM | POA: Insufficient documentation

## 2021-04-10 DIAGNOSIS — I252 Old myocardial infarction: Secondary | ICD-10-CM | POA: Diagnosis not present
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
## 2021-04-11 ENCOUNTER — Telehealth: Payer: Self-pay | Admitting: Interventional Cardiology

## 2021-04-11 NOTE — Telephone Encounter (Signed)
Spoke with daughter and made her aware that pt hasn't been seen in person with Dr. Katrinka Blazing in awhile and we prefer that pt's be seen in person.  Daughter states that her mom is in the hospital with pneumonia and had the flu and pt is just concerned about catching something and him being sick or giving it to his wife once she comes home.  Advised as long as he is doing ok, we can move the appt out.  Daughter states he is doing fine cardiac wise.  Moved appt to April and advised to call sooner if any issues.

## 2021-04-11 NOTE — Telephone Encounter (Signed)
Follow Up:      Patient have an appointment on 04-17-21 with Dr Katrinka Blazing. He wants to know if he can do a Virtual Visit. Wife is sick with pneumonia, he does not want to go out around people.

## 2021-04-14 NOTE — Progress Notes (Signed)
CORNELIO, [name] ([id number]) Visit Report for 04/10/2021 Arrival Information Details Patient Name: Craig, [name]. Date of Service: 04/10/2021 11:15 AM Medical Record Number: [id number] Patient Account Number: [id number] Date of Birth/Sex: [birth year] (86 y.o. M) Treating RN: Levora Dredge Primary Care Megahn Killings: Cyndi Bender Other Clinician: Referring Navid Lenzen: Cyndi Bender Treating Rhea Kaelin/Extender: Skipper Cliche in Treatment: 11 Visit Information History Since Last Visit Added or deleted any medications: No Patient Arrived: Ambulatory Any new allergies or adverse reactions: No Arrival Time: 11:29 Had a fall or experienced change in No Accompanied By: daughter activities of daily living that may affect Transfer Assistance: None risk of falls: Patient Identification Verified: Yes Hospitalized since last visit: No Secondary Verification Process Completed: Yes Has Dressing in Place as Prescribed: Yes Patient Has Alerts: Yes Pain Present Now: No Electronic Signature(s) Signed: 04/14/2021 9:27:28 AM By: Levora Dredge Entered By: Levora Dredge on 04/10/2021 11:32:47 Craig [name] ([id number]) -------------------------------------------------------------------------------- Clinic Level of Care Assessment Details Patient Name: Craig [name]. Date of Service: 04/10/2021 11:15 AM Medical Record Number: [id number] Patient Account Number: [id number] Date of Birth/Sex: [birth year] (86 y.o. M) Treating RN: Levora Dredge Primary Care Arlester Keehan: Cyndi Bender Other Clinician: Referring Antoine Fiallos: Cyndi Bender Treating Nicolus Ose/Extender: Skipper Cliche in Treatment: 11 Clinic Level of Care Assessment Items TOOL 4 Quantity Score X - Use when only an EandM is performed on FOLLOW-UP visit 1 0 ASSESSMENTS - Nursing Assessment / Reassessment []  - Reassessment of Co-morbidities (includes updates in patient status) 0 []  - 0 Reassessment of Adherence to Treatment  Plan ASSESSMENTS - Wound and Skin Assessment / Reassessment X - Simple Wound Assessment / Reassessment - one wound 1 5 []  - 0 Complex Wound Assessment / Reassessment - multiple wounds []  - 0 Dermatologic / Skin Assessment (not related to wound area) ASSESSMENTS - Focused Assessment []  - Circumferential Edema Measurements - multi extremities 0 []  - 0 Nutritional Assessment / Counseling / Intervention []  - 0 Lower Extremity Assessment (monofilament, tuning fork, pulses) []  - 0 Peripheral Arterial Disease Assessment (using hand held doppler) ASSESSMENTS - Ostomy and/or Continence Assessment and Care []  - Incontinence Assessment and Management 0 []  - 0 Ostomy Care Assessment and Management (repouching, etc.) PROCESS - Coordination of Care X - Simple Patient / Family Education for ongoing care 1 15 []  - 0 Complex (extensive) Patient / Family Education for ongoing care []  - 0 Staff obtains Programmer, systems, Records, Test Results / Process Orders []  - 0 Staff telephones HHA, Nursing Homes / Clarify orders / etc []  - 0 Routine Transfer to another Facility (non-emergent condition) []  - 0 Routine Hospital Admission (non-emergent condition) []  - 0 New Admissions / Biomedical engineer / Ordering NPWT, Apligraf, etc. []  - 0 Emergency Hospital Admission (emergent condition) X- 1 10 Simple Discharge Coordination []  - 0 Complex (extensive) Discharge Coordination PROCESS - Special Needs []  - Pediatric / Minor Patient Management 0 []  - 0 Isolation Patient Management []  - 0 Hearing / Language / Visual special needs []  - 0 Assessment of Community assistance (transportation, D/C planning, etc.) []  - 0 Additional assistance / Altered mentation []  - 0 Support Surface(s) Assessment (bed, cushion, seat, etc.) INTERVENTIONS - Wound Cleansing / Measurement Craig [name], Craig [name]. ([id number]) X- 1 5 Simple Wound Cleansing - one wound []  - 0 Complex Wound Cleansing - multiple wounds []  -  0 Wound Imaging (photographs - any number of wounds) []  - 0 Wound Tracing (instead of photographs) X- 1 5 Simple Wound Measurement - one wound []  - 0 Complex  Wound Measurement - multiple wounds INTERVENTIONS - Wound Dressings X - Small Wound Dressing one or multiple wounds 1 10 []  - 0 Medium Wound Dressing one or multiple wounds []  - 0 Large Wound Dressing one or multiple wounds []  - 0 Application of Medications - topical []  - 0 Application of Medications - injection INTERVENTIONS - Miscellaneous []  - External ear exam 0 []  - 0 Specimen Collection (cultures, biopsies, blood, body fluids, etc.) []  - 0 Specimen(s) / Culture(s) sent or taken to Lab for analysis []  - 0 Patient Transfer (multiple staff / Civil Service fast streamer / Similar devices) []  - 0 Simple Staple / Suture removal (25 or less) []  - 0 Complex Staple / Suture removal (26 or more) []  - 0 Hypo / Hyperglycemic Management (close monitor of Blood Glucose) []  - 0 Ankle / Brachial Index (ABI) - do not check if billed separately X- 1 5 Vital Signs Has the patient been seen at the hospital within the last three years: Yes Total Score: 55 Level Of Care: New/Established - Level 2 Electronic Signature(s) Signed: 04/14/2021 9:27:28 AM By: Levora Dredge Entered By: Levora Dredge on 04/10/2021 12:24:02 Craig [name] ([id number]) -------------------------------------------------------------------------------- Encounter Discharge Information Details Patient Name: Craig [name]. Date of Service: 04/10/2021 11:15 AM Medical Record Number: [id number] Patient Account Number: [id number] Date of Birth/Sex: June 19, 1934 (86 y.o. M) Treating RN: Levora Dredge Primary Care Roshawn Ayala: Cyndi Bender Other Clinician: Referring Neesa Knapik: Cyndi Bender Treating Peytin Dechert/Extender: Skipper Cliche in Treatment: 11 Encounter Discharge Information Items Discharge Condition: Stable Ambulatory Status: Ambulatory Discharge  Destination: Home Transportation: Private Auto Accompanied By: daughter Schedule Follow-up Appointment: Yes Clinical Summary of Care: Electronic Signature(s) Signed: 04/10/2021 12:45:09 PM By: Levora Dredge Entered By: Levora Dredge on 04/10/2021 12:45:09 Craig [name] ([id number]) -------------------------------------------------------------------------------- Lower Extremity Assessment Details Patient Name: Craig [name]. Date of Service: 04/10/2021 11:15 AM Medical Record Number: [id number] Patient Account Number: [id number] Date of Birth/Sex: [birth year] (86 y.o. M) Treating RN: Levora Dredge Primary Care Tatjana Turcott: Cyndi Bender Other Clinician: Referring Staceyann Knouff: Cyndi Bender Treating Rhyse Loux/Extender: Jeri Cos Weeks in Treatment: 11 Electronic Signature(s) Signed: 04/14/2021 9:27:28 AM By: Levora Dredge Entered By: Levora Dredge on 04/10/2021 11:44:37 Craig [name] ([id number]) -------------------------------------------------------------------------------- Multi Wound Chart Details Patient Name: Craig [name]. Date of Service: 04/10/2021 11:15 AM Medical Record Number: [id number] Patient Account Number: [id number] Date of Birth/Sex: Apr 18, 1934 (86 y.o. M) Treating RN: Levora Dredge Primary Care Arriona Prest: Cyndi Bender Other Clinician: Referring Larayne Baxley: Cyndi Bender Treating Dorice Stiggers/Extender: Skipper Cliche in Treatment: 11 Vital Signs Height(in): 69 Pulse(bpm): 37 Weight(lbs): 168 Blood Pressure(mmHg): 160/71 Body Mass Index(BMI): 25 Temperature(F): 98.3 Respiratory Rate(breaths/min): 18 Photos: [N/A:N/A] Wound Location: Back N/A N/A Wounding Event: Thermal Burn N/A N/A Primary Etiology: 3rd degree Burn N/A N/A Comorbid History: Coronary Artery Disease, N/A N/A Myocardial Infarction, Type II Diabetes, History of Burn Date Acquired: 06/06/2019 N/A N/A Weeks of Treatment: 11 N/A N/A Wound Status: Open N/A  N/A Measurements L x W x D (cm) 5x8x0.1 N/A N/A Area (cm) : 31.416 N/A N/A Volume (cm) : 3.142 N/A N/A % Reduction in Area: 91.10% N/A N/A % Reduction in Volume: 91.10% N/A N/A Classification: Full Thickness Without Exposed N/A N/A Support Structures Exudate Amount: Large N/A N/A Exudate Type: Serosanguineous N/A N/A Exudate Color: red, brown N/A N/A Wound Margin: Flat and Intact N/A N/A Granulation Amount: Large (67-100%) N/A N/A Granulation Quality: Red, Friable N/A N/A Necrotic Amount: Small (1-33%) N/A N/A Necrotic Tissue: Eschar N/A N/A Exposed Structures: Fat Layer (Subcutaneous Tissue): N/A  N/A Yes Fascia: No Tendon: No Muscle: No Joint: No Bone: No Epithelialization: None N/A N/A Treatment Notes Electronic Signature(s) Signed: 04/14/2021 9:27:28 AM By: Levora Dredge Entered By: Levora Dredge on 04/10/2021 12:02:03 Craig [name] ([id number]) Craig [name] ([id number]) -------------------------------------------------------------------------------- [place] Details Patient Name: REVAN, GENDRON. Date of Service: 04/10/2021 11:15 AM Medical Record Number: [id number] Patient Account Number: [id number] Date of Birth/Sex: Jan 19, 1935 (86 y.o. M) Treating RN: Levora Dredge Primary Care Rachella Basden: Cyndi Bender Other Clinician: Referring Imran Nuon: Cyndi Bender Treating Deborah Dondero/Extender: Skipper Cliche in Treatment: 11 Active Inactive Pain, Acute or Chronic Nursing Diagnoses: Pain Management - Non-cyclic Acute (Procedural) Goals: Patient will verbalize adequate pain control and receive pain control interventions during procedures as needed Date Initiated: 01/21/2021 Target Resolution Date: 01/21/2021 Goal Status: Active Patient/caregiver will verbalize comfort level met Date Initiated: 01/21/2021 Target Resolution Date: 01/21/2021 Goal Status: Active Interventions: Encourage patient to take pain medications as  prescribed Provision of support: recognize patient pain, provide comfort and support as needed Reposition patient for comfort Treatment Activities: Administer pain control measures as ordered : 01/21/2021 Notes: Wound/Skin Impairment Nursing Diagnoses: Impaired tissue integrity Goals: Patient/caregiver will verbalize understanding of skin care regimen Date Initiated: 01/21/2021 Date Inactivated: 02/11/2021 Target Resolution Date: 01/21/2021 Goal Status: Met Ulcer/skin breakdown will have a volume reduction of 30% by week 4 Date Initiated: 01/21/2021 Target Resolution Date: 02/21/2021 Goal Status: Active Interventions: Assess ulceration(s) every visit Treatment Activities: Skin care regimen initiated : 01/21/2021 Topical wound management initiated : 01/21/2021 Notes: Electronic Signature(s) Signed: 04/14/2021 9:27:28 AM By: Levora Dredge Entered By: Levora Dredge on 04/10/2021 12:01:31 Craig [name] ([id number]) -------------------------------------------------------------------------------- Pain Assessment Details Patient Name: Craig [name]. Date of Service: 04/10/2021 11:15 AM Medical Record Number: [id number] Patient Account Number: [id number] Date of Birth/Sex: Sep 24, 1934 (86 y.o. M) Treating RN: Levora Dredge Primary Care Dacoda Finlay: Cyndi Bender Other Clinician: Referring Eythan Jayne: Cyndi Bender Treating Josee Speece/Extender: Skipper Cliche in Treatment: 11 Active Problems Location of Pain Severity and Description of Pain Patient Has Paino No Site Locations Rate the pain. Current Pain Level: 0 Pain Management and Medication Current Pain Management: Electronic Signature(s) Signed: 04/14/2021 9:27:28 AM By: Levora Dredge Entered By: Levora Dredge on 04/10/2021 11:36:11 Craig [name] ([id number]) -------------------------------------------------------------------------------- Patient/Caregiver Education Details Patient Name: Craig [name]. Date of Service: 04/10/2021 11:15 AM Medical Record Number: [id number] Patient Account Number: [id number] Date of Birth/Gender: 26-Nov-1934 (87 y.o. M) Treating RN: Levora Dredge Primary Care Physician: Cyndi Bender Other Clinician: Referring Physician: Cyndi Bender Treating Physician/Extender: Skipper Cliche in Treatment: 11 Education Assessment Education Provided To: Patient Education Topics Provided Wound/Skin Impairment: Handouts: Caring for Your Ulcer Methods: Explain/Verbal Responses: State content correctly Electronic Signature(s) Signed: 04/14/2021 9:27:28 AM By: Levora Dredge Entered By: Levora Dredge on 04/10/2021 12:43:53 Craig [name] ([id number]) -------------------------------------------------------------------------------- Wound Assessment Details Patient Name: Craig [name]. Date of Service: 04/10/2021 11:15 AM Medical Record Number: [id number] Patient Account Number: [id number] Date of Birth/Sex: December 24, 1934 (86 y.o. M) Treating RN: Levora Dredge Primary Care Alastor Kneale: Cyndi Bender Other Clinician: Referring Josclyn Rosales: Cyndi Bender Treating Nare Gaspari/Extender: Skipper Cliche in Treatment: 11 Wound Status Wound Number: 1 Primary 3rd degree Burn Etiology: Wound Location: Back Wound Status: Open Wounding Event: Thermal Burn Comorbid Coronary Artery Disease, Myocardial Infarction, Type II Date Acquired: 06/06/2019 History: Diabetes, History of Burn Weeks Of Treatment: 11 Clustered Wound: No Photos Wound Measurements Length: (cm) 5 Width: (cm) 8 Depth: (cm) 0.1 Area: (cm) 31.416 Volume: (cm) 3.142 % Reduction in Area: 91.1% % Reduction  in Volume: 91.1% Epithelialization: None Tunneling: No Undermining: No Wound Description Classification: Full Thickness Without Exposed Support Structu Wound Margin: Flat and Intact Exudate Amount: Large Exudate Type: Serosanguineous Exudate Color: red, brown res Foul Odor After  Cleansing: No Slough/Fibrino No Wound Bed Granulation Amount: Large (67-100%) Exposed Structure Granulation Quality: Red, Friable Fascia Exposed: No Necrotic Amount: Small (1-33%) Fat Layer (Subcutaneous Tissue) Exposed: Yes Necrotic Quality: Eschar Tendon Exposed: No Muscle Exposed: No Joint Exposed: No Bone Exposed: No Treatment Notes Wound #1 (Back) Cleanser Peri-Wound Care Topical Triamcinolone Acetonide Cream, 0.1%, 15 (g) tube JOESPH, MARCY ([id number]) Discharge Instruction: Apply as directed by Halo Laski. mix 1: 1 ratio with other creams apply to red areas Desitin Maximum Strength Ointment, 1 (oz) tube Discharge Instruction: mix 1:1 ration with other creams apply to red areas Ketoconazole Cream 2%, 30 (g), tube Discharge Instruction: mix 1:1 with other creams apply to red areas Primary Dressing Cutimed Sorbact 1.5x 2.38 (in/in) Discharge Instruction: A bacteria- and fungi binding wound dressing, suitable for cavities and fistulas. It is suitable as a wound filler and allows the passage of wound exudate into a secondary dressing. The dressing helps reducing odor and pain and can improve healing. Secondary Dressing ABD Pad 5x9 (in/in) Discharge Instruction: Cover with ABD pad Secured With 67M [place] Surgical Tape, 2x2 (in/yd) Discharge Instruction: Make sure tape is outside of newly healed skin outside the wound boundaries-just use enough to hold in place Compression Wrap Compression Stockings Add-Ons Electronic Signature(s) Signed: 04/14/2021 9:27:28 AM By: Levora Dredge Entered By: Levora Dredge on 04/10/2021 11:43:56 Craig [name] ([id number]) -------------------------------------------------------------------------------- Vitals Details Patient Name: Craig [name]. Date of Service: 04/10/2021 11:15 AM Medical Record Number: [id number] Patient Account Number: [id number] Date of Birth/Sex: 08/17/1934 (86 y.o. M) Treating RN:  Levora Dredge Primary Care Masashi Snowdon: Cyndi Bender Other Clinician: Referring Girolamo Lortie: Cyndi Bender Treating Nariya Neumeyer/Extender: Skipper Cliche in Treatment: 11 Vital Signs Time Taken: 11:33 Temperature (F): 98.3 Height (in): 69 Pulse (bpm): 67 Weight (lbs): 168 Respiratory Rate (breaths/min): 18 Body Mass Index (BMI): 24.8 Blood Pressure (mmHg): 160/71 Reference Range: 80 - 120 mg / dl Electronic Signature(s) Signed: 04/14/2021 9:27:28 AM By: Levora Dredge Entered By: Levora Dredge on 04/10/2021 11:35:46

## 2021-04-15 ENCOUNTER — Other Ambulatory Visit: Payer: Self-pay

## 2021-04-15 ENCOUNTER — Encounter: Payer: Medicare Other | Admitting: Physician Assistant

## 2021-04-15 ENCOUNTER — Ambulatory Visit: Payer: Medicare Other | Admitting: Interventional Cardiology

## 2021-04-15 DIAGNOSIS — T2134XA Burn of third degree of lower back, initial encounter: Secondary | ICD-10-CM | POA: Diagnosis not present

## 2021-04-15 DIAGNOSIS — E119 Type 2 diabetes mellitus without complications: Secondary | ICD-10-CM | POA: Diagnosis not present

## 2021-04-15 DIAGNOSIS — I2510 Atherosclerotic heart disease of native coronary artery without angina pectoris: Secondary | ICD-10-CM | POA: Diagnosis not present

## 2021-04-15 DIAGNOSIS — T2133XA Burn of third degree of upper back, initial encounter: Secondary | ICD-10-CM | POA: Diagnosis not present

## 2021-04-15 DIAGNOSIS — I252 Old myocardial infarction: Secondary | ICD-10-CM | POA: Diagnosis not present

## 2021-04-15 NOTE — Progress Notes (Signed)
Craig [name] ([id number]) Visit Report for 04/15/2021 Arrival Information Details Patient Name: Craig [name], Craig [name]. Date of Service: 04/15/2021 2:15 PM Medical Record Number: [id number] Patient Account Number: [id number] Date of Birth/Sex: 23-Jan-1935 (87 y.o. M) Treating RN: Levora Dredge Primary Care Brelan Hannen: Cyndi Bender Other Clinician: Referring Maghen Group: Cyndi Bender Treating Tullio Chausse/Extender: Skipper Cliche in Treatment: 12 Visit Information History Since Last Visit Added or deleted any medications: No Patient Arrived: Ambulatory Any new allergies or adverse reactions: No Arrival Time: 14:08 Had a fall or experienced change in No Accompanied By: daughter activities of daily living that may affect Transfer Assistance: None risk of falls: Patient Identification Verified: Yes Hospitalized since last visit: No Secondary Verification Process Completed: Yes Has Dressing in Place as Prescribed: Yes Patient Has Alerts: Yes Pain Present Now: No Electronic Signature(s) Signed: 04/15/2021 4:38:18 PM By: Levora Dredge Entered By: Levora Dredge on 04/15/2021 14:09:20 Craig [name] ([id number]) -------------------------------------------------------------------------------- Clinic Level of Care Assessment Details Patient Name: Craig [name]. Date of Service: 04/15/2021 2:15 PM Medical Record Number: [id number] Patient Account Number: [id number] Date of Birth/Sex: [birth year] (87 y.o. M) Treating RN: Levora Dredge Primary Care Aristide Waggle: Cyndi Bender Other Clinician: Referring Kaison Mcparland: Cyndi Bender Treating Sharnelle Cappelli/Extender: Skipper Cliche in Treatment: 12 Clinic Level of Care Assessment Items TOOL 4 Quantity Score X - Use when only an EandM is performed on FOLLOW-UP visit 1 0 ASSESSMENTS - Nursing Assessment / Reassessment []  - Reassessment of Co-morbidities (includes updates in patient status) 0 []  - 0 Reassessment of Adherence to  Treatment Plan ASSESSMENTS - Wound and Skin Assessment / Reassessment X - Simple Wound Assessment / Reassessment - one wound 1 5 []  - 0 Complex Wound Assessment / Reassessment - multiple wounds []  - 0 Dermatologic / Skin Assessment (not related to wound area) ASSESSMENTS - Focused Assessment []  - Circumferential Edema Measurements - multi extremities 0 []  - 0 Nutritional Assessment / Counseling / Intervention []  - 0 Lower Extremity Assessment (monofilament, tuning fork, pulses) []  - 0 Peripheral Arterial Disease Assessment (using hand held doppler) ASSESSMENTS - Ostomy and/or Continence Assessment and Care []  - Incontinence Assessment and Management 0 []  - 0 Ostomy Care Assessment and Management (repouching, etc.) PROCESS - Coordination of Care X - Simple Patient / Family Education for ongoing care 1 15 []  - 0 Complex (extensive) Patient / Family Education for ongoing care []  - 0 Staff obtains Programmer, systems, Records, Test Results / Process Orders []  - 0 Staff telephones HHA, Nursing Homes / Clarify orders / etc []  - 0 Routine Transfer to another Facility (non-emergent condition) []  - 0 Routine Hospital Admission (non-emergent condition) []  - 0 New Admissions / Biomedical engineer / Ordering NPWT, Apligraf, etc. []  - 0 Emergency Hospital Admission (emergent condition) X- 1 10 Simple Discharge Coordination []  - 0 Complex (extensive) Discharge Coordination PROCESS - Special Needs []  - Pediatric / Minor Patient Management 0 []  - 0 Isolation Patient Management []  - 0 Hearing / Language / Visual special needs []  - 0 Assessment of Community assistance (transportation, D/C planning, etc.) []  - 0 Additional assistance / Altered mentation []  - 0 Support Surface(s) Assessment (bed, cushion, seat, etc.) INTERVENTIONS - Wound Cleansing / Measurement KEJUAN, BEKKER. ([id number]) X- 1 5 Simple Wound Cleansing - one wound []  - 0 Complex Wound Cleansing - multiple  wounds X- 1 5 Wound Imaging (photographs - any number of wounds) []  - 0 Wound Tracing (instead of photographs) X- 1 5 Simple Wound Measurement - one wound []  - 0 Complex  Wound Measurement - multiple wounds INTERVENTIONS - Wound Dressings X - Small Wound Dressing one or multiple wounds 1 10 []  - 0 Medium Wound Dressing one or multiple wounds []  - 0 Large Wound Dressing one or multiple wounds []  - 0 Application of Medications - topical []  - 0 Application of Medications - injection INTERVENTIONS - Miscellaneous []  - External ear exam 0 []  - 0 Specimen Collection (cultures, biopsies, blood, body fluids, etc.) []  - 0 Specimen(s) / Culture(s) sent or taken to Lab for analysis []  - 0 Patient Transfer (multiple staff / Harrel Lemon Lift / Similar devices) []  - 0 Simple Staple / Suture removal (25 or less) []  - 0 Complex Staple / Suture removal (26 or more) []  - 0 Hypo / Hyperglycemic Management (close monitor of Blood Glucose) []  - 0 Ankle / Brachial Index (ABI) - do not check if billed separately X- 1 5 Vital Signs Has the patient been seen at the hospital within the last three years: Yes Total Score: 60 Level Of Care: New/Established - Level 2 Electronic Signature(s) Signed: 04/15/2021 4:38:18 PM By: Levora Dredge Entered By: Levora Dredge on 04/15/2021 14:48:29 Craig [name] ([id number]) -------------------------------------------------------------------------------- Encounter Discharge Information Details Patient Name: Craig [name]. Date of Service: 04/15/2021 2:15 PM Medical Record Number: [id number] Patient Account Number: [id number] Date of Birth/Sex: 1934/09/15 (87 y.o. M) Treating RN: Levora Dredge Primary Care Ceylon Arenson: Cyndi Bender Other Clinician: Referring Belladonna Lubinski: Cyndi Bender Treating Danniela Mcbrearty/Extender: Skipper Cliche in Treatment: 12 Encounter Discharge Information Items Discharge Condition: Stable Ambulatory Status:  Ambulatory Discharge Destination: Home Transportation: Private Auto Accompanied By: daughter Schedule Follow-up Appointment: Yes Clinical Summary of Care: Electronic Signature(s) Signed: 04/15/2021 4:38:18 PM By: Levora Dredge Entered By: Levora Dredge on 04/15/2021 14:49:35 Craig [name] ([id number]) -------------------------------------------------------------------------------- Lower Extremity Assessment Details Patient Name: Craig [name]. Date of Service: 04/15/2021 2:15 PM Medical Record Number: [id number] Patient Account Number: [id number] Date of Birth/Sex: 03-24-1935 (87 y.o. M) Treating RN: Levora Dredge Primary Care Lankford Gutzmer: Cyndi Bender Other Clinician: Referring Jennaya Pogue: Cyndi Bender Treating Jamarquis Crull/Extender: Jeri Cos Weeks in Treatment: 12 Electronic Signature(s) Signed: 04/15/2021 4:38:18 PM By: Levora Dredge Entered By: Levora Dredge on 04/15/2021 14:20:46 Craig [name] ([id number]) -------------------------------------------------------------------------------- Multi Wound Chart Details Patient Name: Craig [name]. Date of Service: 04/15/2021 2:15 PM Medical Record Number: [id number] Patient Account Number: [id number] Date of Birth/Sex: May 09, 1934 (87 y.o. M) Treating RN: Levora Dredge Primary Care Artia Singley: Cyndi Bender Other Clinician: Referring Knoxx Boeding: Cyndi Bender Treating Natelie Ostrosky/Extender: Skipper Cliche in Treatment: 12 Vital Signs Height(in): 69 Pulse(bpm): 70 Weight(lbs): 168 Blood Pressure(mmHg): 137/67 Body Mass Index(BMI): 25 Temperature(F): 98.3 Respiratory Rate(breaths/min): 18 Photos: [N/A:N/A] Wound Location: Back N/A N/A Wounding Event: Thermal Burn N/A N/A Primary Etiology: 3rd degree Burn N/A N/A Comorbid History: Coronary Artery Disease, N/A N/A Myocardial Infarction, Type II Diabetes, History of Burn Date Acquired: 06/06/2019 N/A N/A Weeks of Treatment: 12 N/A N/A Wound Status:  Open N/A N/A Measurements L x W x D (cm) 4.4x6.8x0.1 N/A N/A Area (cm) : 23.499 N/A N/A Volume (cm) : 2.35 N/A N/A % Reduction in Area: 93.40% N/A N/A % Reduction in Volume: 93.40% N/A N/A Classification: Full Thickness Without Exposed N/A N/A Support Structures Exudate Amount: Large N/A N/A Exudate Type: Serosanguineous N/A N/A Exudate Color: red, brown N/A N/A Wound Margin: Flat and Intact N/A N/A Granulation Amount: Large (67-100%) N/A N/A Granulation Quality: Red, Friable N/A N/A Necrotic Amount: Small (1-33%) N/A N/A Necrotic Tissue: Eschar N/A N/A Exposed Structures: Fat Layer (Subcutaneous Tissue): N/A  N/A Yes Fascia: No Tendon: No Muscle: No Joint: No Bone: No Epithelialization: None N/A N/A Treatment Notes Electronic Signature(s) Signed: 04/15/2021 4:38:18 PM By: Levora Dredge Entered By: Levora Dredge on 04/15/2021 14:27:45 Craig [name] ([id number]) Pamala Hurry, Randye Lobo ([id number]) -------------------------------------------------------------------------------- [place] Details Patient Name: Craig [name]. Date of Service: 04/15/2021 2:15 PM Medical Record Number: [id number] Patient Account Number: [id number] Date of Birth/Sex: Jul 23, 1934 (87 y.o. M) Treating RN: Levora Dredge Primary Care Armend Hochstatter: Cyndi Bender Other Clinician: Referring Sharee Sturdy: Cyndi Bender Treating Kenetra Hildenbrand/Extender: Skipper Cliche in Treatment: 12 Active Inactive Pain, Acute or Chronic Nursing Diagnoses: Pain Management - Non-cyclic Acute (Procedural) Goals: Patient will verbalize adequate pain control and receive pain control interventions during procedures as needed Date Initiated: 01/21/2021 Target Resolution Date: 01/21/2021 Goal Status: Active Patient/caregiver will verbalize comfort level met Date Initiated: 01/21/2021 Target Resolution Date: 01/21/2021 Goal Status: Active Interventions: Encourage patient to take pain medications  as prescribed Provision of support: recognize patient pain, provide comfort and support as needed Reposition patient for comfort Treatment Activities: Administer pain control measures as ordered : 01/21/2021 Notes: Wound/Skin Impairment Nursing Diagnoses: Impaired tissue integrity Goals: Patient/caregiver will verbalize understanding of skin care regimen Date Initiated: 01/21/2021 Date Inactivated: 02/11/2021 Target Resolution Date: 01/21/2021 Goal Status: Met Ulcer/skin breakdown will have a volume reduction of 30% by week 4 Date Initiated: 01/21/2021 Target Resolution Date: 02/21/2021 Goal Status: Active Interventions: Assess ulceration(s) every visit Treatment Activities: Skin care regimen initiated : 01/21/2021 Topical wound management initiated : 01/21/2021 Notes: Electronic Signature(s) Signed: 04/15/2021 4:38:18 PM By: Levora Dredge Entered By: Levora Dredge on 04/15/2021 14:27:29 Craig [name] ([id number]) -------------------------------------------------------------------------------- Pain Assessment Details Patient Name: Craig [name]. Date of Service: 04/15/2021 2:15 PM Medical Record Number: [id number] Patient Account Number: [id number] Date of Birth/Sex: 06-06-1934 (87 y.o. M) Treating RN: Levora Dredge Primary Care Tanika Bracco: Cyndi Bender Other Clinician: Referring Javonn Gauger: Cyndi Bender Treating Wilfredo Canterbury/Extender: Skipper Cliche in Treatment: 12 Active Problems Location of Pain Severity and Description of Pain Patient Has Paino No Site Locations Rate the pain. Current Pain Level: 0 Pain Management and Medication Current Pain Management: Electronic Signature(s) Signed: 04/15/2021 4:38:18 PM By: Levora Dredge Entered By: Levora Dredge on 04/15/2021 14:12:02 Craig [name] ([id number]) -------------------------------------------------------------------------------- Patient/Caregiver Education Details Patient Name: Craig [name]. Date of Service: 04/15/2021 2:15 PM Medical Record Number: [id number] Patient Account Number: [id number] Date of Birth/Gender: [birth year] (87 y.o. M) Treating RN: Levora Dredge Primary Care Physician: Cyndi Bender Other Clinician: Referring Physician: Cyndi Bender Treating Physician/Extender: Skipper Cliche in Treatment: 12 Education Assessment Education Provided To: Patient and Caregiver Education Topics Provided Wound/Skin Impairment: Handouts: Caring for Your Ulcer Methods: Explain/Verbal Responses: State content correctly Electronic Signature(s) Signed: 04/15/2021 4:38:18 PM By: Levora Dredge Entered By: Levora Dredge on 04/15/2021 14:48:46 Craig [name] ([id number]) -------------------------------------------------------------------------------- Wound Assessment Details Patient Name: Craig [name]. Date of Service: 04/15/2021 2:15 PM Medical Record Number: [id number] Patient Account Number: [id number] Date of Birth/Sex: 1934-11-07 (87 y.o. M) Treating RN: Levora Dredge Primary Care Niel Peretti: Cyndi Bender Other Clinician: Referring Darrol Brandenburg: Cyndi Bender Treating Khing Belcher/Extender: Jeri Cos Weeks in Treatment: 12 Wound Status Wound Number: 1 Primary 3rd degree Burn Etiology: Wound Location: Back Wound Status: Open Wounding Event: Thermal Burn Comorbid Coronary Artery Disease, Myocardial Infarction, Type II Date Acquired: 06/06/2019 History: Diabetes, History of Burn Weeks Of Treatment: 12 Clustered Wound: No Photos Wound Measurements Length: (cm) 4.4 Width: (cm) 6.8 Depth: (cm) 0.1 Area: (cm) 23.499 Volume: (cm) 2.35 % Reduction in Area: 93.4% %  Reduction in Volume: 93.4% Epithelialization: None Tunneling: No Undermining: No Wound Description Classification: Full Thickness Without Exposed Support Structu Wound Margin: Flat and Intact Exudate Amount: Large Exudate Type: Serosanguineous Exudate Color: red, brown res  Foul Odor After Cleansing: No Slough/Fibrino No Wound Bed Granulation Amount: Large (67-100%) Exposed Structure Granulation Quality: Red, Friable Fascia Exposed: No Necrotic Amount: Small (1-33%) Fat Layer (Subcutaneous Tissue) Exposed: Yes Necrotic Quality: Eschar Tendon Exposed: No Muscle Exposed: No Joint Exposed: No Bone Exposed: No Treatment Notes Wound #1 (Back) Cleanser Peri-Wound Care Topical Triamcinolone Acetonide Cream, 0.1%, 15 (g) tube HYDE, SIRES ([id number]) Discharge Instruction: Apply as directed by Calyx Hawker. mix 1: 1 ratio with other creams apply to red areas Desitin Maximum Strength Ointment, 1 (oz) tube Discharge Instruction: mix half amount ratio with other creams apply to red areas Ketoconazole Cream 2%, 30 (g), tube Discharge Instruction: mix 1:1 with other creams apply to red areas Primary Dressing Cutimed Sorbact 1.5x 2.38 (in/in) Discharge Instruction: A bacteria- and fungi binding wound dressing, suitable for cavities and fistulas. It is suitable as a wound filler and allows the passage of wound exudate into a secondary dressing. The dressing helps reducing odor and pain and can improve healing. Secondary Dressing ABD Pad 5x9 (in/in) Discharge Instruction: Cover with ABD pad Secured With 76M [place] Surgical Tape, 2x2 (in/yd) Discharge Instruction: Make sure tape is outside of newly healed skin outside the wound boundaries-just use enough to hold in place Compression Wrap Compression Stockings Add-Ons Electronic Signature(s) Signed: 04/15/2021 4:38:18 PM By: Levora Dredge Entered By: Levora Dredge on 04/15/2021 14:20:16 Craig [name] ([id number]) -------------------------------------------------------------------------------- [place] Details Patient Name: Craig [name]. Date of Service: 04/15/2021 2:15 PM Medical Record Number: [id number] Patient Account Number: [id number] Date of Birth/Sex: Aug 14, 1934 (87 y.o.  M) Treating RN: Levora Dredge Primary Care Xoe Hoe: Cyndi Bender Other Clinician: Referring Laelah Siravo: Cyndi Bender Treating Niani Mourer/Extender: Skipper Cliche in Treatment: 12 Vital Signs Time Taken: 14:09 Temperature (F): 98.3 Height (in): 69 Pulse (bpm): 70 Weight (lbs): 168 Respiratory Rate (breaths/min): 18 Body Mass Index (BMI): 24.8 Blood Pressure (mmHg): 137/67 Reference Range: 80 - 120 mg / dl Electronic Signature(s) Signed: 04/15/2021 4:38:18 PM By: Levora Dredge Entered By: Levora Dredge on 04/15/2021 14:11:55
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
## 2021-04-22 ENCOUNTER — Other Ambulatory Visit: Payer: Self-pay

## 2021-04-22 DIAGNOSIS — E119 Type 2 diabetes mellitus without complications: Secondary | ICD-10-CM | POA: Diagnosis not present

## 2021-04-22 DIAGNOSIS — T2134XA Burn of third degree of lower back, initial encounter: Secondary | ICD-10-CM | POA: Diagnosis not present

## 2021-04-22 DIAGNOSIS — I252 Old myocardial infarction: Secondary | ICD-10-CM | POA: Diagnosis not present

## 2021-04-22 DIAGNOSIS — I2510 Atherosclerotic heart disease of native coronary artery without angina pectoris: Secondary | ICD-10-CM | POA: Diagnosis not present

## 2021-04-22 DIAGNOSIS — T2133XA Burn of third degree of upper back, initial encounter: Secondary | ICD-10-CM | POA: Diagnosis not present

## 2021-04-23 NOTE — Progress Notes (Signed)
Craig [name], Kelson F. ([id number]) Visit Report for 04/22/2021 Arrival Information Details Patient Name: Craig [name], Ulys F. Date of Service: 04/22/2021 2:15 PM Medical Record Number: [id number] Patient Account Number: [id number] Date of Birth/Sex: 14-Jun-1934 (87 y.o. M) Treating RN: Angelina PihGordon, Caitlin Primary Care Tajah Schreiner: Lonie Peakonroy, Nathan Other Clinician: Referring Gerrianne Aydelott: Lonie Peakonroy, Nathan Treating Azia Toutant/Extender: Rowan BlaseStone, Hoyt Weeks in Treatment: 13 Visit Information History Since Last Visit Added or deleted any medications: No Patient Arrived: Ambulatory Any new allergies or adverse reactions: No Arrival Time: 14:18 Had a fall or experienced change in No Accompanied By: daughter activities of daily living that may affect Transfer Assistance: None risk of falls: Patient Has Alerts: Yes Hospitalized since last visit: No Has Dressing in Place as Prescribed: Yes Pain Present Now: No Electronic Signature(s) Signed: 04/23/2021 9:12:36 AM By: Angelina PihGordon, Caitlin Entered By: Angelina PihGordon, Caitlin on 04/22/2021 14:18:37 Craig [name], Elhadj F. ([id number]) -------------------------------------------------------------------------------- Clinic Level of Care Assessment Details Patient Name: Craig [name], Holley F. Date of Service: 04/22/2021 2:15 PM Medical Record Number: [id number] Patient Account Number: [id number] Date of Birth/Sex: 14-Jun-1934 (87 y.o. M) Treating RN: Angelina PihGordon, Caitlin Primary Care Jacqui Headen: Lonie Peakonroy, Nathan Other Clinician: Referring Marly Schuld: Lonie Peakonroy, Nathan Treating Romilda Proby/Extender: Rowan BlaseStone, Hoyt Weeks in Treatment: 13 Clinic Level of Care Assessment Items TOOL 4 Quantity Score X - Use when only an EandM is performed on FOLLOW-UP visit 1 0 ASSESSMENTS - Nursing Assessment / Reassessment []  - Reassessment of Co-morbidities (includes updates in patient status) 0 []  - 0 Reassessment of Adherence to Treatment Plan ASSESSMENTS - Wound and Skin Assessment / Reassessment X - Simple Wound  Assessment / Reassessment - one wound 1 5 []  - 0 Complex Wound Assessment / Reassessment - multiple wounds []  - 0 Dermatologic / Skin Assessment (not related to wound area) ASSESSMENTS - Focused Assessment []  - Circumferential Edema Measurements - multi extremities 0 []  - 0 Nutritional Assessment / Counseling / Intervention []  - 0 Lower Extremity Assessment (monofilament, tuning fork, pulses) []  - 0 Peripheral Arterial Disease Assessment (using hand held doppler) ASSESSMENTS - Ostomy and/or Continence Assessment and Care []  - Incontinence Assessment and Management 0 []  - 0 Ostomy Care Assessment and Management (repouching, etc.) PROCESS - Coordination of Care X - Simple Patient / Family Education for ongoing care 1 15 []  - 0 Complex (extensive) Patient / Family Education for ongoing care []  - 0 Staff obtains ChiropractorConsents, Records, Test Results / Process Orders []  - 0 Staff telephones HHA, Nursing Homes / Clarify orders / etc []  - 0 Routine Transfer to another Facility (non-emergent condition) []  - 0 Routine Hospital Admission (non-emergent condition) []  - 0 New Admissions / Manufacturing engineernsurance Authorizations / Ordering NPWT, Apligraf, etc. []  - 0 Emergency Hospital Admission (emergent condition) X- 1 10 Simple Discharge Coordination []  - 0 Complex (extensive) Discharge Coordination PROCESS - Special Needs []  - Pediatric / Minor Patient Management 0 []  - 0 Isolation Patient Management []  - 0 Hearing / Language / Visual special needs []  - 0 Assessment of Community assistance (transportation, D/C planning, etc.) []  - 0 Additional assistance / Altered mentation []  - 0 Support Surface(s) Assessment (bed, cushion, seat, etc.) INTERVENTIONS - Wound Cleansing / Measurement Craig [name], Pink F. ([id number]) X- 1 5 Simple Wound Cleansing - one wound []  - 0 Complex Wound Cleansing - multiple wounds []  - 0 Wound Imaging (photographs - any number of wounds) []  - 0 Wound Tracing (instead  of photographs) []  - 0 Simple Wound Measurement - one wound []  - 0 Complex Wound Measurement - multiple wounds INTERVENTIONS - Wound Dressings  X - Small Wound Dressing one or multiple wounds 1 10 []  - 0 Medium Wound Dressing one or multiple wounds []  - 0 Large Wound Dressing one or multiple wounds []  - 0 Application of Medications - topical []  - 0 Application of Medications - injection INTERVENTIONS - Miscellaneous []  - External ear exam 0 []  - 0 Specimen Collection (cultures, biopsies, blood, body fluids, etc.) []  - 0 Specimen(s) / Culture(s) sent or taken to Lab for analysis []  - 0 Patient Transfer (multiple staff / / Similar devices) []  - 0 Simple Staple / Suture removal (25 or less) []  - 0 Complex Staple / Suture removal (26 or more) []  - 0 Hypo / Hyperglycemic Management (close monitor of Blood Glucose) []  - 0 Ankle / Brachial Index (ABI) - do not check if billed separately []  - 0 Vital Signs Has the patient been seen at the hospital within the last three years: Yes Total Score: 45 Level Of Care: New/Established - Level 2 Electronic Signature(s) Signed: 04/23/2021 9:12:36 AM By: Entered By: on 04/22/2021 14:37:46 ( ) -------------------------------------------------------------------------------- Encounter Discharge Information Details Patient Name: . Date of Service: 04/22/2021 2:15 PM Medical Record Number: Patient Account Number: Date of Birth/Sex: 1935/02/25 (87 y.o. M) Treating RN: Primary Care Melaya Hoselton: 04/25/2021 Other Clinician: Referring Jeanmarc Viernes: Angelina Pih Treating Amylah Will/Extender: Angelina Pih in Treatment: 13 Encounter Discharge Information Items Discharge Condition: Stable Ambulatory Status: Ambulatory Discharge Destination: Home Transportation: Private Auto Accompanied By: daughter Schedule Follow-up  Appointment: Yes Clinical Summary of Care: Electronic Signature(s) Signed: 04/23/2021 9:12:36 AM By: [id number] Entered By: Craig Para on 04/22/2021 14:37:10 [id number] ([id number]) -------------------------------------------------------------------------------- Wound Assessment Details Patient Name: 04/29/1934. Date of Service: 04/22/2021 2:15 PM Medical Record Number: Angelina Pih Patient Account Number: Lonie Peak Date of Birth/Sex: 01-22-1935 (87 y.o. M) Treating RN: 14 Primary Care Dylynn Ketner: 04/25/2021 Other Clinician: Referring Febe Champa: Angelina Pih Treating Damek Ende/Extender: Angelina Pih in Treatment: 13 Wound Status Wound Number: 1 Primary 3rd degree Burn Etiology: Wound Location: Back Wound Status: Open Wounding Event: Thermal Burn Comorbid Coronary Artery Disease, Myocardial Infarction, Type II Date Acquired: 06/06/2019 History: Diabetes, History of Burn Weeks Of Treatment: 13 Clustered Wound: No Wound Measurements Length: (cm) 4.4 Width: (cm) 6.8 Depth: (cm) 0.1 Area: (cm) 23.499 Volume: (cm) 2.35 % Reduction in Area: 93.4% % Reduction in Volume: 93.4% Epithelialization: None Tunneling: No Undermining: No Wound Description Classification: Full Thickness Without Exposed Support Structures Wound Margin: Flat and Intact Exudate Amount: Medium Exudate Type: Serosanguineous Exudate Color: red, brown Foul Odor After Cleansing: No Slough/Fibrino No Wound Bed Granulation Amount: Large (67-100%) Exposed Structure Granulation Quality: Red, Friable Fascia Exposed: No Necrotic Amount: Small (1-33%) Fat Layer (Subcutaneous Tissue) Exposed: Yes Necrotic Quality: Eschar Tendon Exposed: No Muscle Exposed: No Joint Exposed: No Bone Exposed: No Assessment Notes patients wound is noticeably smaller and is showing improvement. Patient tolerated dressing change without issue. Treatment Notes Wound #1  (Back) Cleanser Peri-Wound Care Topical Triamcinolone Acetonide Cream, 0.1%, 15 (g) tube Discharge Instruction: Apply as directed by Carylon Tamburro. mix 1: 1 ratio with other creams apply to red areas Desitin Maximum Strength Ointment, 1 (oz) tube Discharge Instruction: mix half amount ratio with other creams apply to red areas Ketoconazole Cream 2%, 30 (g), tube Discharge Instruction: mix 1:1 with other creams apply to red areas Primary Dressing Cutimed Sorbact 1.5x 2.38 (in/in) Discharge Instruction: A bacteria- and fungi binding wound dressing, suitable for  cavities and fistulas. It is suitable as a wound filler and allows the passage of wound exudate into a secondary dressing. The dressing helps reducing odor and pain and can GEORGIOS, KINA. ([id number]) improve healing. Secondary Dressing ABD Pad 5x9 (in/in) Discharge Instruction: Cover with ABD pad Secured With 49M Medipore H Soft Cloth Surgical Tape, 2x2 (in/yd) Discharge Instruction: Make sure tape is outside of newly healed skin outside the wound boundaries-just use enough to hold in place Compression Wrap Compression Stockings Add-Ons Electronic Signature(s) Signed: 04/23/2021 9:12:36 AM By: Angelina Pih Entered By: Angelina Pih on 04/22/2021 14:33:44

## 2021-04-23 NOTE — Progress Notes (Signed)
KRESTON, AHRENDT ([id number]) Visit Report for 04/22/2021 Physician Orders Details Patient Name: Craig [name], Craig [name]. Date of Service: 04/22/2021 2:15 PM Medical Record Number: [id number] Patient Account Number: [id number] Date of Birth/Sex: 03/17/1935 (87 y.o. M) Treating RN: Angelina Pih Primary Care Provider: Lonie Peak Other Clinician: Referring Provider: Lonie Peak Treating Provider/Extender: Rowan Blase in Treatment: [phone] Verbal / Phone Orders: No Diagnosis Coding Follow-up Appointments o Return Appointment in 1 week. o Nurse Visit as needed Bathing/ Shower/ Hygiene o May shower; gently cleanse wound with antibacterial soap, rinse and pat dry prior to dressing wounds o No tub bath. Additional Orders / Instructions o Follow Nutritious Diet and Increase Protein Intake Wound Treatment Wound #1 - Back Topical: Triamcinolone Acetonide Cream, 0.1%, 15 (g) tube Every Other Day/30 Days Discharge Instructions: Apply as directed by provider. mix 1: 1 ratio with other creams apply to red areas Topical: Desitin Maximum Strength Ointment, 1 (oz) tube Every Other Day/30 Days Discharge Instructions: mix half amount ratio with other creams apply to red areas Topical: Ketoconazole Cream 2%, 30 (g), tube Every Other Day/30 Days Discharge Instructions: mix 1:1 with other creams apply to red areas Primary Dressing: Cutimed Sorbact 1.5x 2.38 (in/in) (Generic) Every Other Day/30 Days Discharge Instructions: A bacteria- and fungi binding wound dressing, suitable for cavities and fistulas. It is suitable as a wound filler and allows the passage of wound exudate into a secondary dressing. The dressing helps reducing odor and pain and can improve healing. Secondary Dressing: ABD Pad 5x9 (in/in) (Generic) Every Other Day/30 Days Discharge Instructions: Cover with ABD pad Secured With: 5M Medipore H Soft Cloth Surgical Tape, 2x2 (in/yd) (DME) (Generic) Every Other Day/30  Days Discharge Instructions: Make sure tape is outside of newly healed skin outside the wound boundaries-just use enough to hold in place Electronic Signature(s) Signed: 04/23/2021 9:12:36 AM By: Angelina Pih Signed: 04/23/2021 4:48:19 PM By: Lenda Kelp PA-C Entered By: Angelina Pih on 04/22/2021 14:36:31 Craig [name] ([id number]) -------------------------------------------------------------------------------- SuperBill Details Patient Name: Craig [name]. Date of Service: 04/22/2021 Medical Record Number: [id number] Patient Account Number: [id number] Date of Birth/Sex: Apr 16, 1934 (87 y.o. M) Treating RN: Angelina Pih Primary Care Provider: Lonie Peak Other Clinician: Referring Provider: Lonie Peak Treating Provider/Extender: Rowan Blase in Treatment: 13 Diagnosis Coding ICD-10 Codes Code Description T21.33XA Burn of third degree of upper back, initial encounter T21.34XA Burn of third degree of lower back, initial encounter I25.10 Atherosclerotic heart disease of native coronary artery without angina pectoris Facility Procedures CPT4 Code: 68032122 Description: 48250 - WOUND CARE VISIT-LEV 2 EST PT Modifier: Quantity: 1 Electronic Signature(s) Signed: 04/23/2021 9:12:36 AM By: Angelina Pih Signed: 04/23/2021 4:48:19 PM By: Lenda Kelp PA-C Entered By: Angelina Pih on 04/22/2021 14:37:54

## 2021-04-28 DIAGNOSIS — S21209A Unspecified open wound of unspecified back wall of thorax without penetration into thoracic cavity, initial encounter: Secondary | ICD-10-CM | POA: Diagnosis not present

## 2021-04-29 ENCOUNTER — Encounter: Payer: Medicare Other | Admitting: Physician Assistant

## 2021-04-29 ENCOUNTER — Other Ambulatory Visit: Payer: Self-pay

## 2021-04-29 DIAGNOSIS — T2134XA Burn of third degree of lower back, initial encounter: Secondary | ICD-10-CM | POA: Diagnosis not present

## 2021-04-29 DIAGNOSIS — I252 Old myocardial infarction: Secondary | ICD-10-CM | POA: Diagnosis not present

## 2021-04-29 DIAGNOSIS — I2510 Atherosclerotic heart disease of native coronary artery without angina pectoris: Secondary | ICD-10-CM | POA: Diagnosis not present

## 2021-04-29 DIAGNOSIS — T2133XA Burn of third degree of upper back, initial encounter: Secondary | ICD-10-CM | POA: Diagnosis not present

## 2021-04-29 DIAGNOSIS — E119 Type 2 diabetes mellitus without complications: Secondary | ICD-10-CM | POA: Diagnosis not present

## 2021-04-29 DIAGNOSIS — I10 Essential (primary) hypertension: Secondary | ICD-10-CM | POA: Diagnosis not present
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
## 2021-04-29 NOTE — Progress Notes (Signed)
Craig, [name] ([id number]) Visit Report for 04/29/2021 Arrival Information Details Patient Name: Craig [name], Craig [name]. Date of Service: 04/29/2021 2:15 PM Medical Record Number: [id number] Patient Account Number: [id number] Date of Birth/Sex: 02-07-1935 (87 y.o. M) Treating RN: Levora Dredge Primary Care Silvester Reierson: Cyndi Bender Other Clinician: Referring Kimbria Camposano: Cyndi Bender Treating Brenlee Koskela/Extender: Skipper Cliche in Treatment: 14 Visit Information History Since Last Visit Added or deleted any medications: No Patient Arrived: Ambulatory Any new allergies or adverse reactions: No Arrival Time: 14:27 Had a fall or experienced change in No Accompanied By: daughter activities of daily living that may affect Transfer Assistance: None risk of falls: Patient Identification Verified: Yes Hospitalized since last visit: No Secondary Verification Process Completed: Yes Has Dressing in Place as Prescribed: Yes Patient Has Alerts: Yes Pain Present Now: No Electronic Signature(s) Signed: 04/29/2021 4:34:33 PM By: Levora Dredge Entered By: Levora Dredge on 04/29/2021 14:30:48 Craig [name] ([id number]) -------------------------------------------------------------------------------- Clinic Level of Care Assessment Details Patient Name: Craig [name]. Date of Service: 04/29/2021 2:15 PM Medical Record Number: [id number] Patient Account Number: [id number] Date of Birth/Sex: 11/25/1934 (87 y.o. M) Treating RN: Levora Dredge Primary Care Kamrin Sibley: Cyndi Bender Other Clinician: Referring Bennette Hasty: Cyndi Bender Treating Simona Rocque/Extender: Skipper Cliche in Treatment: 14 Clinic Level of Care Assessment Items TOOL 4 Quantity Score X - Use when only an EandM is performed on FOLLOW-UP visit 1 0 ASSESSMENTS - Nursing Assessment / Reassessment []  - Reassessment of Co-morbidities (includes updates in patient status) 0 []  - 0 Reassessment of Adherence to  Treatment Plan ASSESSMENTS - Wound and Skin Assessment / Reassessment X - Simple Wound Assessment / Reassessment - one wound 1 5 []  - 0 Complex Wound Assessment / Reassessment - multiple wounds []  - 0 Dermatologic / Skin Assessment (not related to wound area) ASSESSMENTS - Focused Assessment []  - Circumferential Edema Measurements - multi extremities 0 []  - 0 Nutritional Assessment / Counseling / Intervention []  - 0 Lower Extremity Assessment (monofilament, tuning fork, pulses) []  - 0 Peripheral Arterial Disease Assessment (using hand held doppler) ASSESSMENTS - Ostomy and/or Continence Assessment and Care []  - Incontinence Assessment and Management 0 []  - 0 Ostomy Care Assessment and Management (repouching, etc.) PROCESS - Coordination of Care X - Simple Patient / Family Education for ongoing care 1 15 []  - 0 Complex (extensive) Patient / Family Education for ongoing care []  - 0 Staff obtains Programmer, systems, Records, Test Results / Process Orders []  - 0 Staff telephones HHA, Nursing Homes / Clarify orders / etc []  - 0 Routine Transfer to another Facility (non-emergent condition) []  - 0 Routine Hospital Admission (non-emergent condition) []  - 0 New Admissions / Biomedical engineer / Ordering NPWT, Apligraf, etc. []  - 0 Emergency Hospital Admission (emergent condition) X- 1 10 Simple Discharge Coordination []  - 0 Complex (extensive) Discharge Coordination PROCESS - Special Needs []  - Pediatric / Minor Patient Management 0 []  - 0 Isolation Patient Management []  - 0 Hearing / Language / Visual special needs []  - 0 Assessment of Community assistance (transportation, D/C planning, etc.) []  - 0 Additional assistance / Altered mentation []  - 0 Support Surface(s) Assessment (bed, cushion, seat, etc.) INTERVENTIONS - Wound Cleansing / Measurement KERON, NEENAN. ([id number]) X- 1 5 Simple Wound Cleansing - one wound []  - 0 Complex Wound Cleansing - multiple  wounds X- 1 5 Wound Imaging (photographs - any number of wounds) []  - 0 Wound Tracing (instead of photographs) X- 1 5 Simple Wound Measurement - one wound []  - 0 Complex  Wound Measurement - multiple wounds INTERVENTIONS - Wound Dressings X - Small Wound Dressing one or multiple wounds 1 10 []  - 0 Medium Wound Dressing one or multiple wounds []  - 0 Large Wound Dressing one or multiple wounds []  - 0 Application of Medications - topical []  - 0 Application of Medications - injection INTERVENTIONS - Miscellaneous []  - External ear exam 0 []  - 0 Specimen Collection (cultures, biopsies, blood, body fluids, etc.) []  - 0 Specimen(s) / Culture(s) sent or taken to Lab for analysis []  - 0 Patient Transfer (multiple staff / Harrel Lemon Lift / Similar devices) []  - 0 Simple Staple / Suture removal (25 or less) []  - 0 Complex Staple / Suture removal (26 or more) []  - 0 Hypo / Hyperglycemic Management (close monitor of Blood Glucose) []  - 0 Ankle / Brachial Index (ABI) - do not check if billed separately X- 1 5 Vital Signs Has the patient been seen at the hospital within the last three years: Yes Total Score: 60 Level Of Care: New/Established - Level 2 Electronic Signature(s) Signed: 04/29/2021 4:34:33 PM By: Levora Dredge Entered By: Levora Dredge on 04/29/2021 15:10:01 Craig [name] ([id number]) -------------------------------------------------------------------------------- Encounter Discharge Information Details Patient Name: Craig [name]. Date of Service: 04/29/2021 2:15 PM Medical Record Number: [id number] Patient Account Number: [id number] Date of Birth/Sex: 1934/04/21 (87 y.o. M) Treating RN: Levora Dredge Primary Care Janey Petron: Cyndi Bender Other Clinician: Referring Brynda Heick: Cyndi Bender Treating Courtenay Hirth/Extender: Skipper Cliche in Treatment: 14 Encounter Discharge Information Items Discharge Condition: Stable Ambulatory Status:  Ambulatory Discharge Destination: Home Transportation: Private Auto Accompanied By: daughter Schedule Follow-up Appointment: Yes Clinical Summary of Care: Electronic Signature(s) Signed: 04/29/2021 4:34:33 PM By: Levora Dredge Entered By: Levora Dredge on 04/29/2021 15:11:29 Craig [name] ([id number]) -------------------------------------------------------------------------------- Lower Extremity Assessment Details Patient Name: Craig [name]. Date of Service: 04/29/2021 2:15 PM Medical Record Number: [id number] Patient Account Number: [id number] Date of Birth/Sex: 1934-09-10 (87 y.o. M) Treating RN: Levora Dredge Primary Care Adriene Padula: Cyndi Bender Other Clinician: Referring Deondray Ospina: Cyndi Bender Treating Nazirah Tri/Extender: Jeri Cos Weeks in Treatment: 14 Electronic Signature(s) Signed: 04/29/2021 4:34:33 PM By: Levora Dredge Entered By: Levora Dredge on 04/29/2021 14:41:33 Craig [name] ([id number]) -------------------------------------------------------------------------------- Multi Wound Chart Details Patient Name: Craig [name]. Date of Service: 04/29/2021 2:15 PM Medical Record Number: [id number] Patient Account Number: [id number] Date of Birth/Sex: 1935/02/25 (87 y.o. M) Treating RN: Levora Dredge Primary Care Seher Schlagel: Cyndi Bender Other Clinician: Referring Alanee Ting: Cyndi Bender Treating Torben Soloway/Extender: Skipper Cliche in Treatment: 14 Vital Signs Height(in): 69 Pulse(bpm): 7 Weight(lbs): 168 Blood Pressure(mmHg): 155/70 Body Mass Index(BMI): 24.8 Temperature(F): 98.4 Respiratory Rate(breaths/min): 18 Photos: [N/A:N/A] Wound Location: Back N/A N/A Wounding Event: Thermal Burn N/A N/A Primary Etiology: 3rd degree Burn N/A N/A Comorbid History: Coronary Artery Disease, N/A N/A Myocardial Infarction, Type II Diabetes, History of Burn Date Acquired: 06/06/2019 N/A N/A Weeks of Treatment: 14 N/A N/A Wound Status:  Open N/A N/A Wound Recurrence: No N/A N/A Measurements L x W x D (cm) 1.2x4.6x0.1 N/A N/A Area (cm) : 4.335 N/A N/A Volume (cm) : 0.434 N/A N/A % Reduction in Area: 98.80% N/A N/A % Reduction in Volume: 98.80% N/A N/A Classification: Full Thickness Without Exposed N/A N/A Support Structures Exudate Amount: Medium N/A N/A Exudate Type: Serosanguineous N/A N/A Exudate Color: red, brown N/A N/A Wound Margin: Flat and Intact N/A N/A Granulation Amount: Large (67-100%) N/A N/A Granulation Quality: Red, Friable N/A N/A Necrotic Amount: Small (1-33%) N/A N/A Necrotic Tissue: Eschar, Adherent Slough N/A N/A  Exposed Structures: Fat Layer (Subcutaneous Tissue): N/A N/A Yes Fascia: No Tendon: No Muscle: No Joint: No Bone: No Epithelialization: Medium (34-66%) N/A N/A Treatment Notes Electronic Signature(s) Signed: 04/29/2021 4:34:33 PM By: Levora Dredge Entered By: Levora Dredge on 04/29/2021 14:52:12 Craig [name] ([id number]) Craig [name] ([id number]) -------------------------------------------------------------------------------- [place] Details Patient Name: Craig [name]. Date of Service: 04/29/2021 2:15 PM Medical Record Number: [id number] Patient Account Number: [id number] Date of Birth/Sex: 1935-01-02 (87 y.o. M) Treating RN: Levora Dredge Primary Care Jaydy Fitzhenry: Cyndi Bender Other Clinician: Referring Tyffany Waldrop: Cyndi Bender Treating Rhapsody Wolven/Extender: Skipper Cliche in Treatment: 14 Active Inactive Pain, Acute or Chronic Nursing Diagnoses: Pain Management - Non-cyclic Acute (Procedural) Goals: Patient will verbalize adequate pain control and receive pain control interventions during procedures as needed Date Initiated: 01/21/2021 Target Resolution Date: 01/21/2021 Goal Status: Active Patient/caregiver will verbalize comfort level met Date Initiated: 01/21/2021 Target Resolution Date: 01/21/2021 Goal Status:  Active Interventions: Encourage patient to take pain medications as prescribed Provision of support: recognize patient pain, provide comfort and support as needed Reposition patient for comfort Treatment Activities: Administer pain control measures as ordered : 01/21/2021 Notes: Wound/Skin Impairment Nursing Diagnoses: Impaired tissue integrity Goals: Patient/caregiver will verbalize understanding of skin care regimen Date Initiated: 01/21/2021 Date Inactivated: 02/11/2021 Target Resolution Date: 01/21/2021 Goal Status: Met Ulcer/skin breakdown will have a volume reduction of 30% by week 4 Date Initiated: 01/21/2021 Target Resolution Date: 02/21/2021 Goal Status: Active Interventions: Assess ulceration(s) every visit Treatment Activities: Skin care regimen initiated : 01/21/2021 Topical wound management initiated : 01/21/2021 Notes: Electronic Signature(s) Signed: 04/29/2021 4:34:33 PM By: Levora Dredge Entered By: Levora Dredge on 04/29/2021 14:51:57 Craig [name] ([id number]) -------------------------------------------------------------------------------- Pain Assessment Details Patient Name: Craig [name]. Date of Service: 04/29/2021 2:15 PM Medical Record Number: [id number] Patient Account Number: [id number] Date of Birth/Sex: 1934-08-14 (87 y.o. M) Treating RN: Levora Dredge Primary Care Iyari Hagner: Cyndi Bender Other Clinician: Referring Solina Heron: Cyndi Bender Treating Carmyn Hamm/Extender: Skipper Cliche in Treatment: 14 Active Problems Location of Pain Severity and Description of Pain Patient Has Paino No Site Locations Rate the pain. Current Pain Level: 0 Pain Management and Medication Current Pain Management: Electronic Signature(s) Signed: 04/29/2021 4:34:33 PM By: Levora Dredge Entered By: Levora Dredge on 04/29/2021 14:32:33 Craig [name]  ([id number]) -------------------------------------------------------------------------------- Patient/Caregiver Education Details Patient Name: Craig [name]. Date of Service: 04/29/2021 2:15 PM Medical Record Number: [id number] Patient Account Number: [id number] Date of Birth/Gender: 25-Aug-1934 (87 y.o. M) Treating RN: Levora Dredge Primary Care Physician: Cyndi Bender Other Clinician: Referring Physician: Cyndi Bender Treating Physician/Extender: Skipper Cliche in Treatment: 14 Education Assessment Education Provided To: Patient and Caregiver Education Topics Provided Wound/Skin Impairment: Handouts: Caring for Your Ulcer Methods: Explain/Verbal Responses: State content correctly Electronic Signature(s) Signed: 04/29/2021 4:34:33 PM By: Levora Dredge Entered By: Levora Dredge on 04/29/2021 15:10:31 Craig [name] ([id number]) -------------------------------------------------------------------------------- Wound Assessment Details Patient Name: Craig [name]. Date of Service: 04/29/2021 2:15 PM Medical Record Number: [id number] Patient Account Number: [id number] Date of Birth/Sex: 05/08/1934 (87 y.o. M) Treating RN: Levora Dredge Primary Care Aanyah Loa: Cyndi Bender Other Clinician: Referring Exilda Wilhite: Cyndi Bender Treating Lisa Blakeman/Extender: Skipper Cliche in Treatment: 14 Wound Status Wound Number: 1 Primary 3rd degree Burn Etiology: Wound Location: Back Wound Status: Open Wounding Event: Thermal Burn Comorbid Coronary Artery Disease, Myocardial Infarction, Type II Date Acquired: 06/06/2019 History: Diabetes, History of Burn Weeks Of Treatment: 14 Clustered Wound: No Photos Wound Measurements Length: (cm) 1.2 Width: (cm) 4.6 Depth: (cm) 0.1 Area: (cm) 4.335  Volume: (cm) 0.434 % Reduction in Area: 98.8% % Reduction in Volume: 98.8% Epithelialization: Medium (34-66%) Tunneling: No Undermining: No Wound  Description Classification: Full Thickness Without Exposed Support Structu Wound Margin: Flat and Intact Exudate Amount: Medium Exudate Type: Serosanguineous Exudate Color: red, brown res Foul Odor After Cleansing: No Slough/Fibrino Yes Wound Bed Granulation Amount: Large (67-100%) Exposed Structure Granulation Quality: Red, Friable Fascia Exposed: No Necrotic Amount: Small (1-33%) Fat Layer (Subcutaneous Tissue) Exposed: Yes Necrotic Quality: Eschar, Adherent Slough Tendon Exposed: No Muscle Exposed: No Joint Exposed: No Bone Exposed: No Treatment Notes Wound #1 (Back) Cleanser Peri-Wound Care Topical Triamcinolone Acetonide Cream, 0.1%, 15 (g) tube TYRELLE, RACZKA ([id number]) Discharge Instruction: Apply as directed by Analayah Brooke. mix 1: 1 ratio with other creams apply to red areas Desitin Maximum Strength Ointment, 1 (oz) tube Discharge Instruction: mix half amount ratio with other creams apply to red areas Ketoconazole Cream 2%, 30 (g), tube Discharge Instruction: mix 1:1 with other creams apply to red areas Primary Dressing Cutimed Sorbact 1.5x 2.38 (in/in) Discharge Instruction: A bacteria- and fungi binding wound dressing, suitable for cavities and fistulas. It is suitable as a wound filler and allows the passage of wound exudate into a secondary dressing. The dressing helps reducing odor and pain and can improve healing. Secondary Dressing ABD Pad 5x9 (in/in) Discharge Instruction: Cover with ABD pad Secured With 68M [place] Surgical Tape, 2x2 (in/yd) Discharge Instruction: Make sure tape is outside of newly healed skin outside the wound boundaries-just use enough to hold in place Compression Wrap Compression Stockings Add-Ons Electronic Signature(s) Signed: 04/29/2021 4:34:33 PM By: Levora Dredge Entered By: Levora Dredge on 04/29/2021 14:41:09 Craig [name]  ([id number]) -------------------------------------------------------------------------------- Vitals Details Patient Name: Craig [name]. Date of Service: 04/29/2021 2:15 PM Medical Record Number: [id number] Patient Account Number: [id number] Date of Birth/Sex: November 24, 1934 (87 y.o. M) Treating RN: Levora Dredge Primary Care Krystin Keeven: Cyndi Bender Other Clinician: Referring Raissa Dam: Cyndi Bender Treating Kilea Mccarey/Extender: Skipper Cliche in Treatment: 14 Vital Signs Time Taken: 14:30 Temperature (F): 98.4 Height (in): 69 Pulse (bpm): 83 Weight (lbs): 168 Respiratory Rate (breaths/min): 18 Body Mass Index (BMI): 24.8 Blood Pressure (mmHg): 155/70 Reference Range: 80 - 120 mg / dl Electronic Signature(s) Signed: 04/29/2021 4:34:33 PM By: Levora Dredge Entered By: Levora Dredge on 04/29/2021 14:32:23

## 2021-05-06 ENCOUNTER — Encounter: Payer: Medicare Other | Admitting: Physician Assistant

## 2021-05-06 ENCOUNTER — Other Ambulatory Visit: Payer: Self-pay

## 2021-05-06 DIAGNOSIS — T2133XA Burn of third degree of upper back, initial encounter: Secondary | ICD-10-CM | POA: Diagnosis not present

## 2021-05-06 DIAGNOSIS — T2134XA Burn of third degree of lower back, initial encounter: Secondary | ICD-10-CM | POA: Diagnosis not present

## 2021-05-06 DIAGNOSIS — E119 Type 2 diabetes mellitus without complications: Secondary | ICD-10-CM | POA: Diagnosis not present

## 2021-05-06 DIAGNOSIS — I252 Old myocardial infarction: Secondary | ICD-10-CM | POA: Diagnosis not present

## 2021-05-06 DIAGNOSIS — I2510 Atherosclerotic heart disease of native coronary artery without angina pectoris: Secondary | ICD-10-CM | POA: Diagnosis not present

## 2021-05-06 DIAGNOSIS — L98422 Non-pressure chronic ulcer of back with fat layer exposed: Secondary | ICD-10-CM | POA: Diagnosis not present

## 2021-05-06 NOTE — Progress Notes (Signed)
TARIG, ZIMMERS ([id number]) Visit Report for 05/06/2021 Arrival Information Details Patient Name: Craig [name], Craig [name]. Date of Service: 05/06/2021 2:15 PM Medical Record Number: [id number] Patient Account Number: [id number] Date of Birth/Sex: [birth year] (87 y.o. M) Treating RN: Levora Dredge Primary Care : Cyndi Bender Other Clinician: Referring : Cyndi Bender Treating /Extender: Skipper Cliche in Treatment: 15 Visit Information History Since Last Visit Added or deleted any medications: No Patient Arrived: Ambulatory Any new allergies or adverse reactions: No Arrival Time: 14:30 Had a fall or experienced change in No Accompanied By: daughter activities of daily living that may affect Transfer Assistance: None risk of falls: Patient Identification Verified: Yes Hospitalized since last visit: No Secondary Verification Process Completed: Yes Has Dressing in Place as Prescribed: Yes Patient Has Alerts: Yes Pain Present Now: No Electronic Signature(s) Signed: 05/06/2021 4:42:42 PM By: Levora Dredge Entered By: Levora Dredge on 05/06/2021 14:32:55 Craig [name] ([id number]) -------------------------------------------------------------------------------- Clinic Level of Care Assessment Details Patient Name: Craig [name]. Date of Service: 05/06/2021 2:15 PM Medical Record Number: [id number] Patient Account Number: [id number] Date of Birth/Sex: [birth year] (87 y.o. M) Treating RN: Levora Dredge Primary Care : Cyndi Bender Other Clinician: Referring : Cyndi Bender Treating /Extender: Skipper Cliche in Treatment: 15 Clinic Level of Care Assessment Items TOOL 4 Quantity Score [] - Use when only an EandM is performed on FOLLOW-UP visit 0 ASSESSMENTS - Nursing Assessment / Reassessment [] - Reassessment of Co-morbidities (includes updates in patient status) 0 [] - 0 Reassessment of Adherence to Treatment  Plan ASSESSMENTS - Wound and Skin Assessment / Reassessment X - Simple Wound Assessment / Reassessment - one wound 1 5 [] - 0 Complex Wound Assessment / Reassessment - multiple wounds [] - 0 Dermatologic / Skin Assessment (not related to wound area) ASSESSMENTS - Focused Assessment [] - Circumferential Edema Measurements - multi extremities 0 [] - 0 Nutritional Assessment / Counseling / Intervention [] - 0 Lower Extremity Assessment (monofilament, tuning fork, pulses) [] - 0 Peripheral Arterial Disease Assessment (using hand held doppler) ASSESSMENTS - Ostomy and/or Continence Assessment and Care [] - Incontinence Assessment and Management 0 [] - 0 Ostomy Care Assessment and Management (repouching, etc.) PROCESS - Coordination of Care X - Simple Patient / Family Education for ongoing care 1 15 [] - 0 Complex (extensive) Patient / Family Education for ongoing care [] - 0 Staff obtains Programmer, systems, Records, Test Results / Process Orders [] - 0 Staff telephones HHA, Nursing Homes / Clarify orders / etc [] - 0 Routine Transfer to another Facility (non-emergent condition) [] - 0 Routine Hospital Admission (non-emergent condition) [] - 0 New Admissions / Biomedical engineer / Ordering NPWT, Apligraf, etc. [] - 0 Emergency Hospital Admission (emergent condition) X- 1 10 Simple Discharge Coordination [] - 0 Complex (extensive) Discharge Coordination PROCESS - Special Needs [] - Pediatric / Minor Patient Management 0 [] - 0 Isolation Patient Management [] - 0 Hearing / Language / Visual special needs [] - 0 Assessment of Community assistance (transportation, D/C planning, etc.) [] - 0 Additional assistance / Altered mentation [] - 0 Support Surface(s) Assessment (bed, cushion, seat, etc.) INTERVENTIONS - Wound Cleansing / Measurement QUINDARIUS, CABELLO. ([id number]) X- 1 5 Simple Wound Cleansing - one wound [] - 0 Complex Wound Cleansing - multiple wounds X- 1  5 Wound Imaging (photographs - any number of wounds) [] - 0 Wound Tracing (instead of photographs) X- 1 5 Simple Wound Measurement - one wound [] - 0 Complex Wound  Measurement - multiple wounds INTERVENTIONS - Wound Dressings X - Small Wound Dressing one or multiple wounds 1 10 [] - 0 Medium Wound Dressing one or multiple wounds [] - 0 Large Wound Dressing one or multiple wounds [] - 0 Application of Medications - topical [] - 0 Application of Medications - injection INTERVENTIONS - Miscellaneous [] - External ear exam 0 [] - 0 Specimen Collection (cultures, biopsies, blood, body fluids, etc.) [] - 0 Specimen(s) / Culture(s) sent or taken to Lab for analysis [] - 0 Patient Transfer (multiple staff / Civil Service fast streamer / Similar devices) [] - 0 Simple Staple / Suture removal (25 or less) [] - 0 Complex Staple / Suture removal (26 or more) [] - 0 Hypo / Hyperglycemic Management (close monitor of Blood Glucose) [] - 0 Ankle / Brachial Index (ABI) - do not check if billed separately X- 1 5 Vital Signs Has the patient been seen at the hospital within the last three years: Yes Total Score: 60 Level Of Care: New/Established - Level 2 Electronic Signature(s) Signed: 05/06/2021 4:42:42 PM By: Levora Dredge Entered By: Levora Dredge on 05/06/2021 16:16:53 Craig [name] ([id number]) -------------------------------------------------------------------------------- Encounter Discharge Information Details Patient Name: Craig [name]. Date of Service: 05/06/2021 2:15 PM Medical Record Number: [id number] Patient Account Number: [id number] Date of Birth/Sex: [birth year] (87 y.o. M) Treating RN: Levora Dredge Primary Care Jahrel Borthwick: Cyndi Bender Other Clinician: Referring Amadu Schlageter: Cyndi Bender Treating Dadrian Ballantine/Extender: Skipper Cliche in Treatment: 15 Encounter Discharge Information Items Discharge Condition: Stable Ambulatory Status: Ambulatory Discharge  Destination: Home Transportation: Private Auto Accompanied By: daughter Schedule Follow-up Appointment: Yes Clinical Summary of Care: Electronic Signature(s) Signed: 05/06/2021 4:18:54 PM By: Levora Dredge Entered By: Levora Dredge on 05/06/2021 16:18:54 Craig [name] ([id number]) -------------------------------------------------------------------------------- Lower Extremity Assessment Details Patient Name: Craig [name]. Date of Service: 05/06/2021 2:15 PM Medical Record Number: [id number] Patient Account Number: [id number] Date of Birth/Sex: November 14, 1934 (87 y.o. M) Treating RN: Levora Dredge Primary Care Makira Holleman: Cyndi Bender Other Clinician: Referring Paulita Licklider: Cyndi Bender Treating Frankee Gritz/Extender: Jeri Cos Weeks in Treatment: 15 Electronic Signature(s) Signed: 05/06/2021 4:42:42 PM By: Levora Dredge Entered By: Levora Dredge on 05/06/2021 14:41:34 Craig [name] ([id number]) -------------------------------------------------------------------------------- Multi Wound Chart Details Patient Name: Craig [name]. Date of Service: 05/06/2021 2:15 PM Medical Record Number: [id number] Patient Account Number: [id number] Date of Birth/Sex: December 14, 1934 (87 y.o. M) Treating RN: Levora Dredge Primary Care Eleny Cortez: Cyndi Bender Other Clinician: Referring Tausha Milhoan: Cyndi Bender Treating Laymon Stockert/Extender: Skipper Cliche in Treatment: 15 Vital Signs Height(in): 69 Pulse(bpm): 71 Weight(lbs): 168 Blood Pressure(mmHg): 157/60 Body Mass Index(BMI): 24.8 Temperature(F): 98.2 Respiratory Rate(breaths/min): 18 Photos: [N/A:N/A] Wound Location: Back N/A N/A Wounding Event: Thermal Burn N/A N/A Primary Etiology: 3rd degree Burn N/A N/A Comorbid History: Coronary Artery Disease, N/A N/A Myocardial Infarction, Type II Diabetes, History of Burn Date Acquired: 06/06/2019 N/A N/A Weeks of Treatment: 15 N/A N/A Wound Status: Open N/A N/A Wound  Recurrence: No N/A N/A Measurements L x W x D (cm) 0.8x2.2x0.1 N/A N/A Area (cm) : 1.382 N/A N/A Volume (cm) : 0.138 N/A N/A % Reduction in Area: 99.60% N/A N/A % Reduction in Volume: 99.60% N/A N/A Classification: Full Thickness Without Exposed N/A N/A Support Structures Exudate Amount: Medium N/A N/A Exudate Type: Serosanguineous N/A N/A Exudate Color: red, brown N/A N/A Wound Margin: Flat and Intact N/A N/A Granulation Amount: Large (67-100%) N/A N/A Granulation Quality: Red, Pink, Friable N/A N/A Necrotic Amount: Small (1-33%) N/A N/A Necrotic Tissue: Eschar, Adherent Slough N/A N/A  Exposed Structures: Fat Layer (Subcutaneous Tissue): N/A N/A Yes Fascia: No Tendon: No Muscle: No Joint: No Bone: No Epithelialization: Medium (34-66%) N/A N/A Treatment Notes Electronic Signature(s) Signed: 05/06/2021 4:42:42 PM By: Levora Dredge Entered By: Levora Dredge on 05/06/2021 14:56:21 Craig [name] ([id number]) Craig [name] ([id number]) -------------------------------------------------------------------------------- [place] Details Patient Name: Craig [name]. Date of Service: 05/06/2021 2:15 PM Medical Record Number: [id number] Patient Account Number: [id number] Date of Birth/Sex: [birth year] (87 y.o. M) Treating RN: Levora Dredge Primary Care : Cyndi Bender Other Clinician: Referring : Cyndi Bender Treating /Extender: Skipper Cliche in Treatment: 15 Active Inactive Pain, Acute or Chronic Nursing Diagnoses: Pain Management - Non-cyclic Acute (Procedural) Goals: Patient will verbalize adequate pain control and receive pain control interventions during procedures as needed Date Initiated: 01/21/2021 Target Resolution Date: 01/21/2021 Goal Status: Active Patient/caregiver will verbalize comfort level met Date Initiated: 01/21/2021 Target Resolution Date: 01/21/2021 Goal Status:  Active Interventions: Encourage patient to take pain medications as prescribed Provision of support: recognize patient pain, provide comfort and support as needed Reposition patient for comfort Treatment Activities: Administer pain control measures as ordered : 01/21/2021 Notes: Wound/Skin Impairment Nursing Diagnoses: Impaired tissue integrity Goals: Patient/caregiver will verbalize understanding of skin care regimen Date Initiated: 01/21/2021 Date Inactivated: 02/11/2021 Target Resolution Date: 01/21/2021 Goal Status: Met Ulcer/skin breakdown will have a volume reduction of 30% by week 4 Date Initiated: 01/21/2021 Target Resolution Date: 02/21/2021 Goal Status: Active Interventions: Assess ulceration(s) every visit Treatment Activities: Skin care regimen initiated : 01/21/2021 Topical wound management initiated : 01/21/2021 Notes: Electronic Signature(s) Signed: 05/06/2021 4:42:42 PM By: Levora Dredge Entered By: Levora Dredge on 05/06/2021 14:55:57 Craig [name] ([id number]) -------------------------------------------------------------------------------- Pain Assessment Details Patient Name: Craig [name]. Date of Service: 05/06/2021 2:15 PM Medical Record Number: [id number] Patient Account Number: [id number] Date of Birth/Sex: June 10, 1934 (87 y.o. M) Treating RN: Levora Dredge Primary Care : Cyndi Bender Other Clinician: Referring : Cyndi Bender Treating /Extender: Skipper Cliche in Treatment: 15 Active Problems Location of Pain Severity and Description of Pain Patient Has Paino No Site Locations Rate the pain. Current Pain Level: 0 Pain Management and Medication Current Pain Management: Electronic Signature(s) Signed: 05/06/2021 4:42:42 PM By: Levora Dredge Entered By: Levora Dredge on 05/06/2021 14:34:36 Craig [name]  ([id number]) -------------------------------------------------------------------------------- Patient/Caregiver Education Details Patient Name: Craig [name]. Date of Service: 05/06/2021 2:15 PM Medical Record Number: [id number] Patient Account Number: [id number] Date of Birth/Gender: [birth year] (87 y.o. M) Treating RN: Levora Dredge Primary Care Physician: Cyndi Bender Other Clinician: Referring Physician: Cyndi Bender Treating Physician/Extender: Skipper Cliche in Treatment: 15 Education Assessment Education Provided To: Patient Education Topics Provided Wound/Skin Impairment: Handouts: Caring for Your Ulcer Methods: Explain/Verbal Responses: State content correctly Electronic Signature(s) Signed: 05/06/2021 4:42:42 PM By: Levora Dredge Entered By: Levora Dredge on 05/06/2021 16:17:46 Craig [name] ([id number]) -------------------------------------------------------------------------------- Wound Assessment Details Patient Name: Craig [name]. Date of Service: 05/06/2021 2:15 PM Medical Record Number: [id number] Patient Account Number: [id number] Date of Birth/Sex: 08/07/1934 (87 y.o. M) Treating RN: Levora Dredge Primary Care : Cyndi Bender Other Clinician: Referring : Cyndi Bender Treating /Extender: Skipper Cliche in Treatment: 15 Wound Status Wound Number: 1 Primary 3rd degree Burn Etiology: Wound Location: Back Wound Status: Open Wounding Event: Thermal Burn Comorbid Coronary Artery Disease, Myocardial Infarction, Type II Date Acquired: 06/06/2019 History: Diabetes, History of Burn Weeks Of Treatment: 15 Clustered Wound: No Photos Wound Measurements Length: (cm) 0.8 Width: (cm) 2.2 Depth: (cm) 0.1 Area: (cm) 1.382 Volume: (cm)  0.138 % Reduction in Area: 99.6% % Reduction in Volume: 99.6% Epithelialization: Medium (34-66%) Tunneling: No Undermining: No Wound Description Classification: Full  Thickness Without Exposed Support Structu Wound Margin: Flat and Intact Exudate Amount: Medium Exudate Type: Serosanguineous Exudate Color: red, brown res Foul Odor After Cleansing: No Slough/Fibrino Yes Wound Bed Granulation Amount: Large (67-100%) Exposed Structure Granulation Quality: Red, Pink, Friable Fascia Exposed: No Necrotic Amount: Small (1-33%) Fat Layer (Subcutaneous Tissue) Exposed: Yes Necrotic Quality: Eschar, Adherent Slough Tendon Exposed: No Muscle Exposed: No Joint Exposed: No Bone Exposed: No Treatment Notes Wound #1 (Back) Cleanser Peri-Wound Care Topical Triamcinolone Acetonide Cream, 0.1%, 15 (g) tube YAPHET, SMETHURST ([id number]) Discharge Instruction: Apply as directed by Sherilee Smotherman. mix 1: 1 ratio with other creams apply to red areas Ketoconazole Cream 2%, 30 (g), tube Discharge Instruction: mix 1:1 with other creams apply to red areas Primary Dressing Cutimed Sorbact 1.5x 2.38 (in/in) Discharge Instruction: A bacteria- and fungi binding wound dressing, suitable for cavities and fistulas. It is suitable as a wound filler and allows the passage of wound exudate into a secondary dressing. The dressing helps reducing odor and pain and can improve healing. Secondary Dressing ABD Pad 5x9 (in/in) Discharge Instruction: Cover with ABD pad Secured With 65M Carly[employer] Surgical Tape, 2x2 (in/yd) Discharge Instruction: Make sure tape is outside of newly healed skin outside the wound boundaries-just use enough to hold in place Compression Wrap Compression Stockings Add-Ons Electronic Signature(s) Signed: 05/06/2021 4:42:42 PM By: Levora Dredge Entered By: Levora Dredge on 05/06/2021 14:41:19 Craig [name] ([id number]) -------------------------------------------------------------------------------- [place] Details Patient Name: Craig [name]. Date of Service: 05/06/2021 2:15 PM Medical Record Number: [id number] Patient Account  Number: [id number] Date of Birth/Sex: [birth year] (87 y.o. M) Treating RN: Levora Dredge Primary Care Kainoa Swoboda: Cyndi Bender Other Clinician: Referring Makaylee Spielberg: Cyndi Bender Treating Saron Tweed/Extender: Skipper Cliche in Treatment: 15 Vital Signs Time Taken: 14:33 Temperature (F): 98.2 Height (in): 69 Pulse (bpm): 77 Weight (lbs): 168 Respiratory Rate (breaths/min): 18 Body Mass Index (BMI): 24.8 Blood Pressure (mmHg): 157/60 Reference Range: 80 - 120 mg / dl Electronic Signature(s) Signed: 05/06/2021 4:42:42 PM By: Levora Dredge Entered By: Levora Dredge on 05/06/2021 14:34:16
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
## 2021-05-07 DIAGNOSIS — T2133XA Burn of third degree of upper back, initial encounter: Secondary | ICD-10-CM | POA: Diagnosis not present

## 2021-05-13 ENCOUNTER — Other Ambulatory Visit: Payer: Self-pay

## 2021-05-13 ENCOUNTER — Encounter: Payer: Medicare Other | Attending: Physician Assistant | Admitting: Physician Assistant

## 2021-05-13 DIAGNOSIS — I2510 Atherosclerotic heart disease of native coronary artery without angina pectoris: Secondary | ICD-10-CM | POA: Insufficient documentation

## 2021-05-13 DIAGNOSIS — T2134XA Burn of third degree of lower back, initial encounter: Secondary | ICD-10-CM | POA: Insufficient documentation

## 2021-05-13 DIAGNOSIS — T2133XA Burn of third degree of upper back, initial encounter: Secondary | ICD-10-CM | POA: Insufficient documentation

## 2021-05-13 DIAGNOSIS — X58XXXA Exposure to other specified factors, initial encounter: Secondary | ICD-10-CM | POA: Diagnosis not present
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
## 2021-05-14 NOTE — Progress Notes (Signed)
Craig [name], Craig [name] ([id number]) Visit Report for 05/13/2021 Arrival Information Details Patient Name: Craig [name], Craig [name]. Date of Service: 05/13/2021 3:15 PM Medical Record Number: [id number] Patient Account Number: [id number] Date of Birth/Sex: [birth year] (87 y.o. M) Treating RN: Cornell Barman Primary Care Dawnmarie Breon: Cyndi Bender Other Clinician: Referring Jaevion Goto: Cyndi Bender Treating Darris Carachure/Extender: Skipper Cliche in Treatment: 34 Visit Information History Since Last Visit Has Dressing in Place as Prescribed: Yes Patient Arrived: Ambulatory Pain Present Now: No Arrival Time: 14:55 Accompanied By: daughter Transfer Assistance: None Patient Identification Verified: Yes Secondary Verification Process Completed: Yes Patient Has Alerts: Yes Electronic Signature(s) Signed: 05/14/2021 5:20:33 PM By: Gretta Cool, BSN, RN, CWS, Kim RN, BSN Entered By: Gretta Cool, BSN, RN, CWS, Kim on 05/13/2021 14:56:16 Craig [name] ([id number]) -------------------------------------------------------------------------------- Clinic Level of Care Assessment Details Patient Name: Craig [name]. Date of Service: 05/13/2021 3:15 PM Medical Record Number: [id number] Patient Account Number: [id number] Date of Birth/Sex: 1934-04-07 (87 y.o. M) Treating RN: Cornell Barman Primary Care Anita Laguna: Cyndi Bender Other Clinician: Referring Cale Decarolis: Cyndi Bender Treating Loring Liskey/Extender: Skipper Cliche in Treatment: 16 Clinic Level of Care Assessment Items TOOL 4 Quantity Score []  - Use when only an EandM is performed on FOLLOW-UP visit 0 ASSESSMENTS - Nursing Assessment / Reassessment X - Reassessment of Co-morbidities (includes updates in patient status) 1 10 X- 1 5 Reassessment of Adherence to Treatment Plan ASSESSMENTS - Wound and Skin Assessment / Reassessment X - Simple Wound Assessment / Reassessment - one wound 1 5 []  - 0 Complex Wound Assessment / Reassessment - multiple wounds []  -  0 Dermatologic / Skin Assessment (not related to wound area) ASSESSMENTS - Focused Assessment []  - Circumferential Edema Measurements - multi extremities 0 []  - 0 Nutritional Assessment / Counseling / Intervention []  - 0 Lower Extremity Assessment (monofilament, tuning fork, pulses) []  - 0 Peripheral Arterial Disease Assessment (using hand held doppler) ASSESSMENTS - Ostomy and/or Continence Assessment and Care []  - Incontinence Assessment and Management 0 []  - 0 Ostomy Care Assessment and Management (repouching, etc.) PROCESS - Coordination of Care X - Simple Patient / Family Education for ongoing care 1 15 []  - 0 Complex (extensive) Patient / Family Education for ongoing care []  - 0 Staff obtains Programmer, systems, Records, Test Results / Process Orders []  - 0 Staff telephones HHA, Nursing Homes / Clarify orders / etc []  - 0 Routine Transfer to another Facility (non-emergent condition) []  - 0 Routine Hospital Admission (non-emergent condition) []  - 0 New Admissions / Biomedical engineer / Ordering NPWT, Apligraf, etc. []  - 0 Emergency Hospital Admission (emergent condition) []  - 0 Simple Discharge Coordination []  - 0 Complex (extensive) Discharge Coordination PROCESS - Special Needs []  - Pediatric / Minor Patient Management 0 []  - 0 Isolation Patient Management []  - 0 Hearing / Language / Visual special needs []  - 0 Assessment of Community assistance (transportation, D/C planning, etc.) []  - 0 Additional assistance / Altered mentation []  - 0 Support Surface(s) Assessment (bed, cushion, seat, etc.) INTERVENTIONS - Wound Cleansing / Measurement Craig [name], Craig [name]. ([id number]) X- 1 5 Simple Wound Cleansing - one wound []  - 0 Complex Wound Cleansing - multiple wounds X- 1 5 Wound Imaging (photographs - any number of wounds) []  - 0 Wound Tracing (instead of photographs) X- 1 5 Simple Wound Measurement - one wound []  - 0 Complex Wound Measurement - multiple  wounds INTERVENTIONS - Wound Dressings X - Small Wound Dressing one or multiple wounds 1 10 []  - 0 Medium Wound Dressing one or  multiple wounds []  - 0 Large Wound Dressing one or multiple wounds []  - 0 Application of Medications - topical []  - 0 Application of Medications - injection INTERVENTIONS - Miscellaneous []  - External ear exam 0 []  - 0 Specimen Collection (cultures, biopsies, blood, body fluids, etc.) []  - 0 Specimen(s) / Culture(s) sent or taken to Lab for analysis []  - 0 Patient Transfer (multiple staff / [employer] fast streamer / Similar devices) []  - 0 Simple Staple / Suture removal (25 or less) []  - 0 Complex Staple / Suture removal (26 or more) []  - 0 Hypo / Hyperglycemic Management (close monitor of Blood Glucose) []  - 0 Ankle / Brachial Index (ABI) - do not check if billed separately X- 1 5 Vital Signs Has the patient been seen at the hospital within the last three years: Yes Total Score: 65 Level Of Care: New/Established - Level 2 Electronic Signature(s) Signed: 05/14/2021 5:20:33 PM By: Gretta Cool, BSN, RN, CWS, Kim RN, BSN Entered By: Gretta Cool, BSN, RN, CWS, Kim on 05/13/2021 15:26:31 Craig [name] ([id number]) -------------------------------------------------------------------------------- Encounter Discharge Information Details Patient Name: Craig [name]. Date of Service: 05/13/2021 3:15 PM Medical Record Number: [id number] Patient Account Number: [id number] Date of Birth/Sex: 1934-12-09 (87 y.o. M) Treating RN: Cornell Barman Primary Care Gentle Hoge: Cyndi Bender Other Clinician: Referring Aniya Jolicoeur: Cyndi Bender Treating Elwyn Klosinski/Extender: Skipper Cliche in Treatment: 16 Encounter Discharge Information Items Discharge Condition: Stable Ambulatory Status: Ambulatory Discharge Destination: Home Transportation: Private Auto Accompanied By: daughter Schedule Follow-up Appointment: Yes Clinical Summary of Care: Electronic Signature(s) Signed: 05/14/2021  5:20:33 PM By: Gretta Cool, BSN, RN, CWS, Kim RN, BSN Entered By: Gretta Cool, BSN, RN, CWS, Kim on 05/13/2021 15:30:23 Craig [name] ([id number]) -------------------------------------------------------------------------------- Lower Extremity Assessment Details Patient Name: Craig [name], Craig [name]. Date of Service: 05/13/2021 3:15 PM Medical Record Number: [id number] Patient Account Number: [id number] Date of Birth/Sex: Jul 08, 1934 (87 y.o. M) Treating RN: Cornell Barman Primary Care Theresea Trautmann: Cyndi Bender Other Clinician: Referring Rafaelita Foister: Cyndi Bender Treating Acelin Ferdig/Extender: Skipper Cliche in Treatment: 16 Electronic Signature(s) Signed: 05/13/2021 3:05:25 PM By: Gretta Cool, BSN, RN, CWS, Kim RN, BSN Entered By: Gretta Cool, BSN, RN, CWS, Kim on 05/13/2021 15:05:25 Craig [name] ([id number]) -------------------------------------------------------------------------------- Multi Wound Chart Details Patient Name: Craig [name], Craig [name]. Date of Service: 05/13/2021 3:15 PM Medical Record Number: [id number] Patient Account Number: [id number] Date of Birth/Sex: Sep 13, 1934 (87 y.o. M) Treating RN: Cornell Barman Primary Care Kadin Canipe: Cyndi Bender Other Clinician: Referring Bree Heinzelman: Cyndi Bender Treating Jp Eastham/Extender: Skipper Cliche in Treatment: 16 Vital Signs Height(in): 69 Pulse(bpm): 80 Weight(lbs): 168 Blood Pressure(mmHg): 148/66 Body Mass Index(BMI): 24.8 Temperature(F): 98.5 Respiratory Rate(breaths/min): 16 Photos: [N/A:N/A] Wound Location: Back N/A N/A Wounding Event: Thermal Burn N/A N/A Primary Etiology: 3rd degree Burn N/A N/A Comorbid History: Coronary Artery Disease, N/A N/A Myocardial Infarction, Type II Diabetes, History of Burn Date Acquired: 06/06/2019 N/A N/A Weeks of Treatment: 16 N/A N/A Wound Status: Open N/A N/A Wound Recurrence: No N/A N/A Measurements L x W x D (cm) 0.3x0.6x0.1 N/A N/A Area (cm) : 0.141 N/A N/A Volume (cm) : 0.014 N/A N/A % Reduction  in Area: 100.00% N/A N/A % Reduction in Volume: 100.00% N/A N/A Classification: Full Thickness Without Exposed N/A N/A Support Structures Exudate Amount: Medium N/A N/A Exudate Type: Serosanguineous N/A N/A Exudate Color: red, brown N/A N/A Wound Margin: Flat and Intact N/A N/A Granulation Amount: Large (67-100%) N/A N/A Granulation Quality: Red, Pink N/A N/A Necrotic Amount: Small (1-33%) N/A N/A Exposed Structures: Fat Layer (Subcutaneous Tissue): N/A N/A Yes Fascia: No  Tendon: No Muscle: No Joint: No Bone: No Epithelialization: Medium (34-66%) N/A N/A Treatment Notes Electronic Signature(s) Signed: 05/14/2021 5:20:33 PM By: Gretta Cool, BSN, RN, CWS, Kim RN, BSN Entered By: Gretta Cool, BSN, RN, CWS, Kim on 05/13/2021 15:23:21 Craig [name] ([id number]) Craig [name] ([id number]) -------------------------------------------------------------------------------- [place] Details Patient Name: Craig [name], Craig [name]. Date of Service: 05/13/2021 3:15 PM Medical Record Number: [id number] Patient Account Number: [id number] Date of Birth/Sex: 1934/10/25 (87 y.o. M) Treating RN: Cornell Barman Primary Care Masashi Snowdon: Cyndi Bender Other Clinician: Referring Alexzandria Massman: Cyndi Bender Treating Elisia Stepp/Extender: Skipper Cliche in Treatment: 16 Active Inactive Wound/Skin Impairment Nursing Diagnoses: Impaired tissue integrity Goals: Patient/caregiver will verbalize understanding of skin care regimen Date Initiated: 01/21/2021 Date Inactivated: 02/11/2021 Target Resolution Date: 01/21/2021 Goal Status: Met Ulcer/skin breakdown will have a volume reduction of 30% by week 4 Date Initiated: 01/21/2021 Target Resolution Date: 02/21/2021 Goal Status: Active Interventions: Assess ulceration(s) every visit Treatment Activities: Skin care regimen initiated : 01/21/2021 Topical wound management initiated : 01/21/2021 Notes: Electronic Signature(s) Signed: 05/14/2021  5:20:33 PM By: Gretta Cool, BSN, RN, CWS, Kim RN, BSN Entered By: Gretta Cool, BSN, RN, CWS, Kim on 05/13/2021 15:23:06 Craig [name] ([id number]) -------------------------------------------------------------------------------- Pain Assessment Details Patient Name: Craig [name]. Date of Service: 05/13/2021 3:15 PM Medical Record Number: [id number] Patient Account Number: [id number] Date of Birth/Sex: [birth year] (87 y.o. M) Treating RN: Cornell Barman Primary Care Rayanna Matusik: Cyndi Bender Other Clinician: Referring Ohanna Gassert: Cyndi Bender Treating Eyoel Throgmorton/Extender: Skipper Cliche in Treatment: 16 Active Problems Location of Pain Severity and Description of Pain Patient Has Paino No Site Locations Pain Management and Medication Current Pain Management: Electronic Signature(s) Signed: 05/14/2021 5:20:33 PM By: Gretta Cool, BSN, RN, CWS, Kim RN, BSN Entered By: Gretta Cool, BSN, RN, CWS, Kim on 05/13/2021 15:00:03 Craig [name] ([id number]) -------------------------------------------------------------------------------- Patient/Caregiver Education Details Patient Name: Craig [name]. Date of Service: 05/13/2021 3:15 PM Medical Record Number: [id number] Patient Account Number: [id number] Date of Birth/Gender: November 17, 1934 (87 y.o. M) Treating RN: Cornell Barman Primary Care Physician: Cyndi Bender Other Clinician: Referring Physician: Cyndi Bender Treating Physician/Extender: Skipper Cliche in Treatment: 16 Education Assessment Education Provided To: Patient Education Topics Provided Wound/Skin Impairment: Handouts: Caring for Your Ulcer Methods: Demonstration, Explain/Verbal Responses: State content correctly Electronic Signature(s) Signed: 05/14/2021 5:20:33 PM By: Gretta Cool, BSN, RN, CWS, Kim RN, BSN Entered By: Gretta Cool, BSN, RN, CWS, Kim on 05/13/2021 15:26:52 Craig [name] ([id number]) -------------------------------------------------------------------------------- Wound  Assessment Details Patient Name: Craig [name]. Date of Service: 05/13/2021 3:15 PM Medical Record Number: [id number] Patient Account Number: [id number] Date of Birth/Sex: 1935/01/21 (87 y.o. M) Treating RN: Cornell Barman Primary Care Avereigh Spainhower: Cyndi Bender Other Clinician: Referring Betzayda Braxton: Cyndi Bender Treating Shereese Bonnie/Extender: Skipper Cliche in Treatment: 16 Wound Status Wound Number: 1 Primary 3rd degree Burn Etiology: Wound Location: Back Wound Status: Open Wounding Event: Thermal Burn Comorbid Coronary Artery Disease, Myocardial Infarction, Type II Date Acquired: 06/06/2019 History: Diabetes, History of Burn Weeks Of Treatment: 16 Clustered Wound: No Photos Photo Uploaded By: Gretta Cool, BSN, RN, CWS, Kim on 05/13/2021 15:06:08 Wound Measurements Length: (cm) 0.3 Width: (cm) 0.6 Depth: (cm) 0.1 Area: (cm) 0.141 Volume: (cm) 0.014 % Reduction in Area: 100% % Reduction in Volume: 100% Epithelialization: Medium (34-66%) Wound Description Classification: Full Thickness Without Exposed Support Structu Wound Margin: Flat and Intact Exudate Amount: Medium Exudate Type: Serosanguineous Exudate Color: red, brown res Foul Odor After Cleansing: No Slough/Fibrino Yes Wound Bed Granulation Amount: Large (67-100%) Exposed Structure Granulation Quality: Red, Pink Fascia Exposed: No Necrotic Amount: Small (  1-33%) Fat Layer (Subcutaneous Tissue) Exposed: Yes Necrotic Quality: Adherent Slough Tendon Exposed: No Muscle Exposed: No Joint Exposed: No Bone Exposed: No Treatment Notes Wound #1 (Back) Cleanser Peri-Wound Care Triamcinolone Acetonide Cream, 0.1%, 15 (g) tube JAMMY, PLOTKIN ([id number]) Discharge Instruction: Apply as directed. Topical Primary Dressing Mepitel One Silicone Wound Contact Layer, 2x3 (in/in) Discharge Instruction: Add to wound bed to prevent sticking. Secondary Dressing Secured With Compression Wrap Compression  Stockings Environmental education officer) Signed: 05/14/2021 5:20:33 PM By: Gretta Cool, BSN, RN, CWS, Kim RN, BSN Entered By: Gretta Cool, BSN, RN, CWS, Kim on 05/13/2021 15:04:13 Craig [name] ([id number]) -------------------------------------------------------------------------------- [place] Details Patient Name: Craig [name]. Date of Service: 05/13/2021 3:15 PM Medical Record Number: [id number] Patient Account Number: [id number] Date of Birth/Sex: September 22, 1934 (87 y.o. M) Treating RN: Cornell Barman Primary Care Oprah Camarena: Cyndi Bender Other Clinician: Referring Virgle Arth: Cyndi Bender Treating Sallee Hogrefe/Extender: Skipper Cliche in Treatment: 16 Vital Signs Time Taken: 14:56 Temperature (F): 98.5 Height (in): 69 Pulse (bpm): 80 Weight (lbs): 168 Respiratory Rate (breaths/min): 16 Body Mass Index (BMI): 24.8 Blood Pressure (mmHg): 148/66 Reference Range: 80 - 120 mg / dl Electronic Signature(s) Signed: 05/14/2021 5:20:33 PM By: Gretta Cool, BSN, RN, CWS, Kim RN, BSN Entered By: Gretta Cool, BSN, RN, CWS, Kim on 05/13/2021 14:59:48

## 2021-05-20 ENCOUNTER — Other Ambulatory Visit: Payer: Self-pay

## 2021-05-20 ENCOUNTER — Ambulatory Visit: Payer: Medicare Other | Admitting: Physician Assistant

## 2021-05-20 DIAGNOSIS — T2134XA Burn of third degree of lower back, initial encounter: Secondary | ICD-10-CM | POA: Diagnosis not present

## 2021-05-20 DIAGNOSIS — I2510 Atherosclerotic heart disease of native coronary artery without angina pectoris: Secondary | ICD-10-CM | POA: Diagnosis not present

## 2021-05-20 DIAGNOSIS — T2133XA Burn of third degree of upper back, initial encounter: Secondary | ICD-10-CM | POA: Diagnosis not present

## 2021-05-20 NOTE — Progress Notes (Signed)
IHAN, MCWHIRTER ([id number]) Visit Report for 05/20/2021 Arrival Information Details Patient Name: Craig [name], Craig [name]. Date of Service: 05/20/2021 11:00 AM Medical Record Number: [id number] Patient Account Number: [id number] Date of Birth/Sex: 1934/08/08 (87 y.o. M) Treating RN: Donnamarie Poag Primary Care Yukie Bergeron: Cyndi Bender Other Clinician: Referring Bryant Lipps: Cyndi Bender Treating Zyniah Ferraiolo/Extender: Skipper Cliche in Treatment: 51 Visit Information History Since Last Visit Added or deleted any medications: No Patient Arrived: Ambulatory Had a fall or experienced change in No Arrival Time: 11:00 activities of daily living that may affect Accompanied By: daughter risk of falls: Transfer Assistance: None Hospitalized since last visit: No Patient Identification Verified: Yes Has Dressing in Place as Prescribed: Yes Secondary Verification Process Completed: Yes Pain Present Now: No Patient Requires Transmission-Based Precautions: No Patient Has Alerts: Yes Electronic Signature(s) Signed: 05/20/2021 11:49:34 AM By: Donnamarie Poag Entered By: Donnamarie Poag on 05/20/2021 11:49:34 Craig [name] ([id number]) -------------------------------------------------------------------------------- Clinic Level of Care Assessment Details Patient Name: Craig [name]. Date of Service: 05/20/2021 11:00 AM Medical Record Number: [id number] Patient Account Number: [id number] Date of Birth/Sex: [birth year] (87 y.o. M) Treating RN: Donnamarie Poag Primary Care Amarys Sliwinski: Cyndi Bender Other Clinician: Referring Mckinnley Smithey: Cyndi Bender Treating Markelle Asaro/Extender: Skipper Cliche in Treatment: 17 Clinic Level of Care Assessment Items TOOL 4 Quantity Score []  - Use when only an EandM is performed on FOLLOW-UP visit 0 ASSESSMENTS - Nursing Assessment / Reassessment []  - Reassessment of Co-morbidities (includes updates in patient status) 0 []  - 0 Reassessment of Adherence to Treatment  Plan ASSESSMENTS - Wound and Skin Assessment / Reassessment X - Simple Wound Assessment / Reassessment - one wound 1 5 []  - 0 Complex Wound Assessment / Reassessment - multiple wounds []  - 0 Dermatologic / Skin Assessment (not related to wound area) ASSESSMENTS - Focused Assessment []  - Circumferential Edema Measurements - multi extremities 0 []  - 0 Nutritional Assessment / Counseling / Intervention []  - 0 Lower Extremity Assessment (monofilament, tuning fork, pulses) []  - 0 Peripheral Arterial Disease Assessment (using hand held doppler) ASSESSMENTS - Ostomy and/or Continence Assessment and Care []  - Incontinence Assessment and Management 0 []  - 0 Ostomy Care Assessment and Management (repouching, etc.) PROCESS - Coordination of Care X - Simple Patient / Family Education for ongoing care 1 15 []  - 0 Complex (extensive) Patient / Family Education for ongoing care []  - 0 Staff obtains Programmer, systems, Records, Test Results / Process Orders []  - 0 Staff telephones HHA, Nursing Homes / Clarify orders / etc []  - 0 Routine Transfer to another Facility (non-emergent condition) []  - 0 Routine Hospital Admission (non-emergent condition) []  - 0 New Admissions / Biomedical engineer / Ordering NPWT, Apligraf, etc. []  - 0 Emergency Hospital Admission (emergent condition) X- 1 10 Simple Discharge Coordination []  - 0 Complex (extensive) Discharge Coordination PROCESS - Special Needs []  - Pediatric / Minor Patient Management 0 []  - 0 Isolation Patient Management []  - 0 Hearing / Language / Visual special needs []  - 0 Assessment of Community assistance (transportation, D/C planning, etc.) []  - 0 Additional assistance / Altered mentation []  - 0 Support Surface(s) Assessment (bed, cushion, seat, etc.) INTERVENTIONS - Wound Cleansing / Measurement Craig [name], Craig [name]. ([id number]) X- 1 5 Simple Wound Cleansing - one wound []  - 0 Complex Wound Cleansing - multiple wounds []  -  0 Wound Imaging (photographs - any number of wounds) []  - 0 Wound Tracing (instead of photographs) X- 1 5 Simple Wound Measurement - one wound []  - 0 Complex Wound Measurement -  multiple wounds INTERVENTIONS - Wound Dressings X - Small Wound Dressing one or multiple wounds 1 10 []  - 0 Medium Wound Dressing one or multiple wounds []  - 0 Large Wound Dressing one or multiple wounds []  - 0 Application of Medications - topical []  - 0 Application of Medications - injection INTERVENTIONS - Miscellaneous []  - External ear exam 0 []  - 0 Specimen Collection (cultures, biopsies, blood, body fluids, etc.) []  - 0 Specimen(s) / Culture(s) sent or taken to Lab for analysis []  - 0 Patient Transfer (multiple staff / Harrel Lemon Lift / Similar devices) []  - 0 Simple Staple / Suture removal (25 or less) []  - 0 Complex Staple / Suture removal (26 or more) []  - 0 Hypo / Hyperglycemic Management (close monitor of Blood Glucose) []  - 0 Ankle / Brachial Index (ABI) - do not check if billed separately []  - 0 Vital Signs Has the patient been seen at the hospital within the last three years: Yes Total Score: 50 Level Of Care: New/Established - Level 2 Electronic Signature(s) Unsigned Entered ByDonnamarie Poag on 05/20/2021 11:56:47 Signature(s): Date(s): Craig [name] ([id number]) -------------------------------------------------------------------------------- Encounter Discharge Information Details Patient Name: Craig [name], Craig [name]. Date of Service: 05/20/2021 11:00 AM Medical Record Number: [id number] Patient Account Number: [id number] Date of Birth/Sex: 01/03/1935 (86 y.o. M) Treating RN: Donnamarie Poag Primary Care Tomio Kirk: Cyndi Bender Other Clinician: Referring Younique Casad: Cyndi Bender Treating Daylee Delahoz/Extender: Skipper Cliche in Treatment: 17 Encounter Discharge Information Items Discharge Condition: Stable Ambulatory Status: Ambulatory Discharge Destination:  Home Transportation: Private Auto Accompanied By: self Schedule Follow-up Appointment: Yes Clinical Summary of Care: Electronic Signature(s) Signed: 05/20/2021 11:56:28 AM By: Donnamarie Poag Previous Signature: 05/20/2021 11:56:19 AM Version By: Donnamarie Poag Entered By: Donnamarie Poag on 05/20/2021 11:56:28 Craig [name] ([id number]) -------------------------------------------------------------------------------- Wound Assessment Details Patient Name: Craig [name]. Date of Service: 05/20/2021 11:00 AM Medical Record Number: [id number] Patient Account Number: [id number] Date of Birth/Sex: 12-18-1934 (87 y.o. M) Treating RN: Donnamarie Poag Primary Care Aziyah Provencal: Cyndi Bender Other Clinician: Referring Ambers Iyengar: Cyndi Bender Treating Jessa Stinson/Extender: Skipper Cliche in Treatment: 17 Wound Status Wound Number: 1 Primary 3rd degree Burn Etiology: Wound Location: Back Wound Status: Open Wounding Event: Thermal Burn Comorbid Coronary Artery Disease, Myocardial Infarction, Type II Date Acquired: 06/06/2019 History: Diabetes, History of Burn Weeks Of Treatment: 17 Clustered Wound: No Wound Measurements Length: (cm) 0.1 Width: (cm) 0.1 Depth: (cm) 0.1 Area: (cm) 0.008 Volume: (cm) 0.001 % Reduction in Area: 100% % Reduction in Volume: 100% Epithelialization: Medium (34-66%) Wound Description Classification: Full Thickness Without Exposed Support Structures Wound Margin: Flat and Intact Exudate Amount: None Present Foul Odor After Cleansing: No Slough/Fibrino No Wound Bed Granulation Amount: Large (67-100%) Exposed Structure Granulation Quality: Pink Fascia Exposed: No Necrotic Amount: Small (1-33%) Fat Layer (Subcutaneous Tissue) Exposed: No Necrotic Quality: Adherent Slough Tendon Exposed: No Muscle Exposed: No Joint Exposed: No Bone Exposed: No Treatment Notes Wound #1 (Back) Cleanser Normal Saline Discharge Instruction: Wash your hands with soap and water.  Remove old dressing, discard into plastic bag and place into trash. Cleanse the wound with Normal Saline prior to applying a clean dressing using gauze sponges, not tissues or cotton balls. Do not scrub or use excessive force. Pat dry using gauze sponges, not tissue or cotton balls. Soap and Water Discharge Instruction: Gently cleanse wound with antibacterial soap, rinse and pat dry prior to dressing wounds Peri-Wound Care Topical Triamcinolone Acetonide Cream, 0.1%, 15 (g) tube Discharge Instruction: Apply as directed by Kymberli Wiegand. mix 1: 1  ratio with other creams apply to red areas Primary Dressing Mepitel One Silicone Wound Contact Layer, 2x3 (in/in) Discharge Instruction: Add to wound bed to prevent sticking. Secondary Dressing Secured With Craig [name], Craig [name] ([id number]) Compression Wrap Compression Stockings Add-Ons Electronic Signature(s) Signed: 05/20/2021 11:52:02 AM By: Donnamarie Poag Entered ByDonnamarie Poag on 05/20/2021 11:52:02
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
## 2021-05-27 ENCOUNTER — Encounter: Payer: Medicare Other | Admitting: Physician Assistant

## 2021-05-27 ENCOUNTER — Other Ambulatory Visit: Payer: Self-pay

## 2021-05-27 DIAGNOSIS — I2510 Atherosclerotic heart disease of native coronary artery without angina pectoris: Secondary | ICD-10-CM | POA: Diagnosis not present

## 2021-05-27 DIAGNOSIS — T2134XA Burn of third degree of lower back, initial encounter: Secondary | ICD-10-CM | POA: Diagnosis not present

## 2021-05-27 DIAGNOSIS — T2133XA Burn of third degree of upper back, initial encounter: Secondary | ICD-10-CM | POA: Diagnosis not present
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
## 2021-05-27 NOTE — Progress Notes (Signed)
DELORE, MENNER ([id number]) Visit Report for 05/27/2021 Arrival Information Details Patient Name: Craig [name], Craig [name]. Date of Service: 05/27/2021 3:00 PM Medical Record Number: [id number] Patient Account Number: [id number] Date of Birth/Sex: [birth year] (87 y.o. M) Treating RN: Angelina Pih Primary Care Nykeem Citro: Lonie Peak Other Clinician: Referring Shadoe Cryan: Lonie Peak Treating Juri Dinning/Extender: Rowan Blase in Treatment: 18 Visit Information History Since Last Visit Added or deleted any medications: No Patient Arrived: Ambulatory Any new allergies or adverse reactions: No Arrival Time: 15:03 Had a fall or experienced change in No Accompanied By: daughter activities of daily living that may affect Transfer Assistance: None risk of falls: Patient Requires Transmission-Based Precautions: No Hospitalized since last visit: No Patient Has Alerts: Yes Has Dressing in Place as Prescribed: Yes Pain Present Now: No Electronic Signature(s) Signed: 05/27/2021 3:53:43 PM By: Angelina Pih Entered By: Angelina Pih on 05/27/2021 15:03:29 Craig [name] ([id number]) -------------------------------------------------------------------------------- Clinic Level of Care Assessment Details Patient Name: Craig [name]. Date of Service: 05/27/2021 3:00 PM Medical Record Number: [id number] Patient Account Number: [id number] Date of Birth/Sex: 09/08/1934 (87 y.o. M) Treating RN: Angelina Pih Primary Care Lawrence Mitch: Lonie Peak Other Clinician: Referring Ashaki Frosch: Lonie Peak Treating Kerry Chisolm/Extender: Rowan Blase in Treatment: 18 Clinic Level of Care Assessment Items TOOL 4 Quantity Score []  - Use when only an EandM is performed on FOLLOW-UP visit 0 ASSESSMENTS - Nursing Assessment / Reassessment []  - Reassessment of Co-morbidities (includes updates in patient status) 0 []  - 0 Reassessment of Adherence to Treatment Plan ASSESSMENTS - Wound and  Skin Assessment / Reassessment X - Simple Wound Assessment / Reassessment - one wound 1 5 []  - 0 Complex Wound Assessment / Reassessment - multiple wounds []  - 0 Dermatologic / Skin Assessment (not related to wound area) ASSESSMENTS - Focused Assessment []  - Circumferential Edema Measurements - multi extremities 0 []  - 0 Nutritional Assessment / Counseling / Intervention []  - 0 Lower Extremity Assessment (monofilament, tuning fork, pulses) []  - 0 Peripheral Arterial Disease Assessment (using hand held doppler) ASSESSMENTS - Ostomy and/or Continence Assessment and Care []  - Incontinence Assessment and Management 0 []  - 0 Ostomy Care Assessment and Management (repouching, etc.) PROCESS - Coordination of Care X - Simple Patient / Family Education for ongoing care 1 15 []  - 0 Complex (extensive) Patient / Family Education for ongoing care []  - 0 Staff obtains Chiropractor, Records, Test Results / Process Orders []  - 0 Staff telephones HHA, Nursing Homes / Clarify orders / etc []  - 0 Routine Transfer to another Facility (non-emergent condition) []  - 0 Routine Hospital Admission (non-emergent condition) []  - 0 New Admissions / Manufacturing engineer / Ordering NPWT, Apligraf, etc. []  - 0 Emergency Hospital Admission (emergent condition) X- 1 10 Simple Discharge Coordination []  - 0 Complex (extensive) Discharge Coordination PROCESS - Special Needs []  - Pediatric / Minor Patient Management 0 []  - 0 Isolation Patient Management []  - 0 Hearing / Language / Visual special needs []  - 0 Assessment of Community assistance (transportation, D/C planning, etc.) []  - 0 Additional assistance / Altered mentation []  - 0 Support Surface(s) Assessment (bed, cushion, seat, etc.) INTERVENTIONS - Wound Cleansing / Measurement CHANNING, ZIRKLE. ([id number]) X- 1 5 Simple Wound Cleansing - one wound []  - 0 Complex Wound Cleansing - multiple wounds X- 1 5 Wound Imaging (photographs - any  number of wounds) []  - 0 Wound Tracing (instead of photographs) X- 1 5 Simple Wound Measurement - one wound []  - 0 Complex Wound Measurement - multiple wounds  INTERVENTIONS - Wound Dressings []  - Small Wound Dressing one or multiple wounds 0 []  - 0 Medium Wound Dressing one or multiple wounds []  - 0 Large Wound Dressing one or multiple wounds []  - 0 Application of Medications - topical []  - 0 Application of Medications - injection INTERVENTIONS - Miscellaneous []  - External ear exam 0 []  - 0 Specimen Collection (cultures, biopsies, blood, body fluids, etc.) []  - 0 Specimen(s) / Culture(s) sent or taken to Lab for analysis []  - 0 Patient Transfer (multiple staff / / Similar devices) []  - 0 Simple Staple / Suture removal (25 or less) []  - 0 Complex Staple / Suture removal (26 or more) []  - 0 Hypo / Hyperglycemic Management (close monitor of Blood Glucose) []  - 0 Ankle / Brachial Index (ABI) - do not check if billed separately X- 1 5 Vital Signs Has the patient been seen at the hospital within the last three years: Yes Total Score: 50 Level Of Care: New/Established - Level 2 Electronic Signature(s) Signed: 05/27/2021 3:53:43 PM By: Entered By: on 05/27/2021 15:31:47 ( ) -------------------------------------------------------------------------------- Encounter Discharge Information Details Patient Name: . Date of Service: 05/27/2021 3:00 PM Medical Record Number: Nurse, adult Patient Account Number: Date of Birth/Sex: 03-26-1935 (87 y.o. M) Treating RN: Primary Care Laelyn Blumenthal: 05/29/2021 Other Clinician: Referring Nivedita Mirabella: Angelina Pih Treating Andreu Drudge/Extender: Angelina Pih in Treatment: 18 Encounter Discharge Information Items Discharge Condition: Stable Ambulatory Status: Ambulatory Discharge Destination: Home Transportation: Private  Auto Accompanied By: daughter Schedule Follow-up Appointment: Yes Clinical Summary of Care: Electronic Signature(s) Signed: 05/27/2021 3:53:43 PM By: [id number] Entered By: Craig [name] on 05/27/2021 15:32:49 [id number] ([id number]) -------------------------------------------------------------------------------- Multi Wound Chart Details Patient Name: 04/29/1934. Date of Service: 05/27/2021 3:00 PM Medical Record Number: Angelina Pih Patient Account Number: Lonie Peak Date of Birth/Sex: 07/17/1934 (87 y.o. M) Treating RN: 19 Primary Care Hildy Nicholl: 05/29/2021 Other Clinician: Referring Damita Eppard: Angelina Pih Treating Carynn Felling/Extender: Angelina Pih in Treatment: 18 Vital Signs Height(in): 69 Pulse(bpm): 62 Weight(lbs): 168 Blood Pressure(mmHg): 151/70 Body Mass Index(BMI): 24.8 Temperature(F): 98.4 Respiratory Rate(breaths/min): 18 Photos: [N/A:N/A] Wound Location: Back N/A N/A Wounding Event: Thermal Burn N/A N/A Primary Etiology: 3rd degree Burn N/A N/A Comorbid History: Coronary Artery Disease, N/A N/A Myocardial Infarction, Type II Diabetes, History of Burn Date Acquired: 06/06/2019 N/A N/A Weeks of Treatment: 18 N/A N/A Wound Status: Healed - Epithelialized N/A N/A Wound Recurrence: No N/A N/A Measurements L x W x D (cm) 0x0x0 N/A N/A Area (cm) : 0 N/A N/A Volume (cm) : 0 N/A N/A % Reduction in Area: 100.00% N/A N/A % Reduction in Volume: 100.00% N/A N/A Classification: Full Thickness Without Exposed N/A N/A Support Structures Exudate Amount: None Present N/A N/A Wound Margin: Flat and Intact N/A N/A Granulation Amount: Large (67-100%) N/A N/A Granulation Quality: Pink N/A N/A Necrotic Amount: None Present (0%) N/A N/A Exposed Structures: Fascia: No N/A N/A Fat Layer (Subcutaneous Tissue): No Tendon: No Muscle: No Joint: No Bone: No Epithelialization: Large (67-100%) N/A N/A Treatment Notes Electronic  Signature(s) Signed: 05/27/2021 3:53:43 PM By: [id number] Entered By: Craig [name] on 05/27/2021 15:28:15 [id number] ([id number]) -------------------------------------------------------------------------------- Multi-Disciplinary Care Plan Details Patient Name: 04/29/1934. Date of Service: 05/27/2021 3:00 PM Medical Record Number: Angelina Pih Patient Account Number: Lonie Peak Date of Birth/Sex: [birth year] (87 y.o. M) Treating RN: 08/06/2019 Primary Care Yareth Kearse: 05/29/2021 Other Clinician: Referring Ladislaus Repsher: Angelina Pih Treating Kashonda Sarkisyan/Extender: Angelina Pih,  Jake Samples in Treatment: 18 Active Inactive Electronic Signature(s) Signed: 05/27/2021 3:53:43 PM By: Angelina Pih Entered By: Angelina Pih on 05/27/2021 15:28:02 GHASSAN, COGGESHALL ([id number]) -------------------------------------------------------------------------------- Pain Assessment Details Patient Name: Craig [name]. Date of Service: 05/27/2021 3:00 PM Medical Record Number: [id number] Patient Account Number: [id number] Date of Birth/Sex: 1935/01/23 (87 y.o. M) Treating RN: Angelina Pih Primary Care Tanara Turvey: Lonie Peak Other Clinician: Referring Ivie Maese: Lonie Peak Treating Azlyn Wingler/Extender: Rowan Blase in Treatment: 18 Active Problems Location of Pain Severity and Description of Pain Patient Has Paino No Site Locations Rate the pain. Current Pain Level: 0 Pain Management and Medication Current Pain Management: Electronic Signature(s) Signed: 05/27/2021 3:53:43 PM By: Angelina Pih Entered By: Angelina Pih on 05/27/2021 15:05:49 Craig [name] ([id number]) -------------------------------------------------------------------------------- Patient/Caregiver Education Details Patient Name: Craig [name]. Date of Service: 05/27/2021 3:00 PM Medical Record Number: [id number] Patient Account Number: [id number] Date of Birth/Gender:  08/30/1934 (87 y.o. M) Treating RN: Angelina Pih Primary Care Physician: Lonie Peak Other Clinician: Referring Physician: Lonie Peak Treating Physician/Extender: Rowan Blase in Treatment: 18 Education Assessment Education Provided To: Patient Education Topics Provided Wound/Skin Impairment: Handouts: Other: caring for healed wound Methods: Explain/Verbal Responses: State content correctly Electronic Signature(s) Signed: 05/27/2021 3:53:43 PM By: Angelina Pih Entered By: Angelina Pih on 05/27/2021 15:32:14 Craig [name] ([id number]) -------------------------------------------------------------------------------- Wound Assessment Details Patient Name: Craig [name]. Date of Service: 05/27/2021 3:00 PM Medical Record Number: [id number] Patient Account Number: [id number] Date of Birth/Sex: February 12, 1935 (87 y.o. M) Treating RN: Angelina Pih Primary Care Jamarkus Lisbon: Lonie Peak Other Clinician: Referring Mikaili Flippin: Lonie Peak Treating Tonika Eden/Extender: Rowan Blase in Treatment: 18 Wound Status Wound Number: 1 Primary 3rd degree Burn Etiology: Wound Location: Back Wound Status: Healed - Epithelialized Wounding Event: Thermal Burn Comorbid Coronary Artery Disease, Myocardial Infarction, Type II Date Acquired: 06/06/2019 History: Diabetes, History of Burn Weeks Of Treatment: 18 Clustered Wound: No Photos Wound Measurements Length: (cm) 0 Width: (cm) 0 Depth: (cm) 0 Area: (cm) 0 Volume: (cm) 0 % Reduction in Area: 100% % Reduction in Volume: 100% Epithelialization: Large (67-100%) Tunneling: No Undermining: No Wound Description Classification: Full Thickness Without Exposed Support Structure Wound Margin: Flat and Intact Exudate Amount: None Present s Foul Odor After Cleansing: No Slough/Fibrino No Wound Bed Granulation Amount: Large (67-100%) Exposed Structure Granulation Quality: Pink Fascia Exposed: No Necrotic Amount:  None Present (0%) Fat Layer (Subcutaneous Tissue) Exposed: No Tendon Exposed: No Muscle Exposed: No Joint Exposed: No Bone Exposed: No Treatment Notes Wound #1 (Back) Cleanser Peri-Wound Care Topical Primary Dressing KRAVEN, CALK ([id number]) Secondary Dressing Secured With Compression Wrap Compression Stockings Add-Ons Electronic Signature(s) Signed: 05/27/2021 3:53:43 PM By: Angelina Pih Entered By: Angelina Pih on 05/27/2021 15:21:30 Craig [name] ([id number]) -------------------------------------------------------------------------------- Vitals Details Patient Name: Craig [name]. Date of Service: 05/27/2021 3:00 PM Medical Record Number: [id number] Patient Account Number: [id number] Date of Birth/Sex: Sep 05, 1934 (87 y.o. M) Treating RN: Angelina Pih Primary Care Tra[place]e Knee: Lonie Peak Other Clinician: Referring Stuart Guillen: Lonie Peak Treating Laasya Peyton/Extender: Rowan Blase in Treatment: 18 Vital Signs Time Taken: 15:03 Temperature (F): 98.4 Height (in): 69 Pulse (bpm): 62 Weight (lbs): 168 Respiratory Rate (breaths/min): 18 Body Mass Index (BMI): 24.8 Blood Pressure (mmHg): 151/70 Reference Range: 80 - 120 mg / dl Electronic Signature(s) Signed: 05/27/2021 3:53:43 PM By: Angelina Pih Entered By: Angelina Pih on 05/27/2021 15:05:38

## 2021-05-29 ENCOUNTER — Ambulatory Visit
Payer: Medicare Other | Attending: Student in an Organized Health Care Education/Training Program | Admitting: Student in an Organized Health Care Education/Training Program

## 2021-05-29 ENCOUNTER — Other Ambulatory Visit: Payer: Self-pay

## 2021-05-29 ENCOUNTER — Encounter: Payer: Self-pay | Admitting: Student in an Organized Health Care Education/Training Program

## 2021-05-29 VITALS — BP 154/75 | HR 61 | Temp 97.5°F | Resp 14 | Ht 68.0 in | Wt 169.5 lb

## 2021-05-29 DIAGNOSIS — Z0289 Encounter for other administrative examinations: Secondary | ICD-10-CM | POA: Diagnosis not present

## 2021-05-29 DIAGNOSIS — G894 Chronic pain syndrome: Secondary | ICD-10-CM | POA: Diagnosis not present

## 2021-05-29 DIAGNOSIS — M546 Pain in thoracic spine: Secondary | ICD-10-CM | POA: Insufficient documentation

## 2021-05-29 DIAGNOSIS — T3111 Burns involving 10-19% of body surface with 10-19% third degree burns: Secondary | ICD-10-CM

## 2021-05-29 DIAGNOSIS — Z79891 Long term (current) use of opiate analgesic: Secondary | ICD-10-CM | POA: Diagnosis not present

## 2021-05-29 DIAGNOSIS — G8929 Other chronic pain: Secondary | ICD-10-CM

## 2021-05-29 DIAGNOSIS — M792 Neuralgia and neuritis, unspecified: Secondary | ICD-10-CM | POA: Diagnosis not present

## 2021-05-29 NOTE — Progress Notes (Signed)
Nursing Pain Medication Assessment:  Safety precautions to be maintained throughout the outpatient stay will include: orient to surroundings, keep bed in low position, maintain call bell within reach at all times, provide assistance with transfer out of bed and ambulation.  Medication Inspection Compliance: Pill count conducted under aseptic conditions, in front of the patient. Neither the pills nor the bottle was removed from the patient's sight at any time. Once count was completed pills were immediately returned to the patient in their original bottle.  Medication: Tramadol (Ultram) Pill/Patch Count:  88 of 90 pills remain Pill/Patch Appearance: Markings consistent with prescribed medication Bottle Appearance: Standard pharmacy container. Clearly labeled. Filled Date: 02 / 21 / 2022 Last Medication intake:  Today
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
## 2021-06-03 ENCOUNTER — Encounter: Payer: Medicare Other | Admitting: Physician Assistant

## 2021-07-17 DIAGNOSIS — Z0000 Encounter for general adult medical examination without abnormal findings: Secondary | ICD-10-CM | POA: Diagnosis not present

## 2021-07-17 DIAGNOSIS — Z9181 History of falling: Secondary | ICD-10-CM | POA: Diagnosis not present

## 2021-07-18 DIAGNOSIS — E119 Type 2 diabetes mellitus without complications: Secondary | ICD-10-CM | POA: Diagnosis not present

## 2021-07-18 DIAGNOSIS — I2510 Atherosclerotic heart disease of native coronary artery without angina pectoris: Secondary | ICD-10-CM | POA: Diagnosis not present

## 2021-07-18 DIAGNOSIS — S069X0S Unspecified intracranial injury without loss of consciousness, sequela: Secondary | ICD-10-CM | POA: Diagnosis not present

## 2021-07-18 DIAGNOSIS — T2120XA Burn of second degree of trunk, unspecified site, initial encounter: Secondary | ICD-10-CM | POA: Diagnosis not present

## 2021-07-18 DIAGNOSIS — Z6823 Body mass index (BMI) 23.0-23.9, adult: Secondary | ICD-10-CM | POA: Diagnosis not present

## 2021-07-18 DIAGNOSIS — N183 Chronic kidney disease, stage 3 unspecified: Secondary | ICD-10-CM | POA: Diagnosis not present
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
## 2021-07-29 ENCOUNTER — Encounter: Payer: Self-pay | Admitting: Interventional Cardiology

## 2021-07-29 ENCOUNTER — Ambulatory Visit: Payer: Medicare Other | Admitting: Interventional Cardiology

## 2021-07-29 VITALS — BP 132/72 | HR 59 | Ht 68.0 in | Wt 166.6 lb

## 2021-07-29 DIAGNOSIS — Z720 Tobacco use: Secondary | ICD-10-CM | POA: Diagnosis not present

## 2021-07-29 DIAGNOSIS — I2510 Atherosclerotic heart disease of native coronary artery without angina pectoris: Secondary | ICD-10-CM | POA: Diagnosis not present

## 2021-07-29 DIAGNOSIS — E785 Hyperlipidemia, unspecified: Secondary | ICD-10-CM

## 2021-07-29 DIAGNOSIS — I10 Essential (primary) hypertension: Secondary | ICD-10-CM

## 2021-07-29 DIAGNOSIS — S065XAD Traumatic subdural hemorrhage with loss of consciousness status unknown, subsequent encounter: Secondary | ICD-10-CM | POA: Diagnosis not present

## 2021-07-29 DIAGNOSIS — S065XAA Traumatic subdural hemorrhage with loss of consciousness status unknown, initial encounter: Secondary | ICD-10-CM | POA: Diagnosis not present

## 2021-07-29 MED ORDER — ASPIRIN EC 81 MG PO TBEC: 81.0000 mg | DELAYED_RELEASE_TABLET | Freq: Every day | ORAL | 3 refills | Status: AC

## 2021-07-29 NOTE — Patient Instructions (Signed)
Medication Instructions:  ?Your physician has recommended you make the following change in your medication:  ?START: Aspirin 81 mg enteric coated by mouth on Monday/Wednesday/ Friday ?*If you need a refill on your cardiac medications before your next appointment, please call your pharmacy* ? ? ?Lab Work: ?NONE ?If you have labs (blood work) drawn today and your tests are completely normal, you will receive your results only by: ?MyChart Message (if you have MyChart) OR ?A paper copy in the mail ?If you have any lab test that is abnormal or we need to change your treatment, we will call you to review the results. ? ? ?Testing/Procedures: ?NONE ? ? ?Follow-Up: ?At [facility], you and your health needs are our priority.  As part of our continuing mission to provide you with exceptional heart care, we have created designated Provider Care Teams.  These Care Teams include your primary Cardiologist (physician) and Advanced Practice Providers (APPs -  Physician Assistants and Nurse Practitioners) who all work together to provide you with the care you need, when you need it. ? ?We recommend signing up for the patient portal called "MyChart".  Sign up information is provided on this After Visit Summary.  MyChart is used to connect with patients for Virtual Visits (Telemedicine).  Patients are able to view lab/test results, encounter notes, upcoming appointments, etc.  Non-urgent messages can be sent to your provider as well.   ?To learn more about what you can do with MyChart, go to [email or web address].   ? ?Your next appointment:   ?1 year(s) ? ?The format for your next appointment:   ?In Person ? ?Provider:   ?Lesleigh Noe, MD   ? ? ?Important Information About Sugar ? ? ? ? ?  ?

## 2021-10-21 ENCOUNTER — Other Ambulatory Visit: Payer: Self-pay | Admitting: Student in an Organized Health Care Education/Training Program

## 2021-10-21 DIAGNOSIS — G8929 Other chronic pain: Secondary | ICD-10-CM

## 2021-10-21 DIAGNOSIS — T3111 Burns involving 10-19% of body surface with 10-19% third degree burns: Secondary | ICD-10-CM

## 2021-10-21 DIAGNOSIS — Z79891 Long term (current) use of opiate analgesic: Secondary | ICD-10-CM

## 2021-10-21 DIAGNOSIS — M792 Neuralgia and neuritis, unspecified: Secondary | ICD-10-CM

## 2022-07-13 ENCOUNTER — Telehealth: Payer: Self-pay

## 2022-07-13 NOTE — Patient Outreach (Signed)
  Care Coordination   Initial Visit Note   07/13/2022 Name: Craig [name] MRN: [id number] DOB: 06/04/1934  Craig [name] is a 88 y.o. year old male who sees Lonie [facility], [place] for primary care. I spoke with  [name] by phone today.Spoke with patients daughter.   What matters to the patients health and wellness today?  Placed call to patient to review and offer [facility] care coordination program.  Daughter reports that patient is doing well. Denies any needs.       SDOH assessments and interventions completed:  No     Care Coordination Interventions:  No, not indicated   Follow up plan: No further intervention required.   Encounter Outcome:  Pt. Refused   Rowe Pavy, RN, BSN, CEN [facility] [phone]

## 2022-08-04 ENCOUNTER — Encounter: Payer: Medicare Other | Attending: Physician Assistant | Admitting: Physician Assistant

## 2022-08-04 DIAGNOSIS — E1122 Type 2 diabetes mellitus with diabetic chronic kidney disease: Secondary | ICD-10-CM | POA: Insufficient documentation

## 2022-08-04 DIAGNOSIS — S21209A Unspecified open wound of unspecified back wall of thorax without penetration into thoracic cavity, initial encounter: Secondary | ICD-10-CM | POA: Diagnosis not present

## 2022-08-04 DIAGNOSIS — I129 Hypertensive chronic kidney disease with stage 1 through stage 4 chronic kidney disease, or unspecified chronic kidney disease: Secondary | ICD-10-CM | POA: Insufficient documentation

## 2022-08-04 DIAGNOSIS — L03312 Cellulitis of back [any part except buttock]: Secondary | ICD-10-CM | POA: Diagnosis not present

## 2022-08-04 DIAGNOSIS — L98422 Non-pressure chronic ulcer of back with fat layer exposed: Secondary | ICD-10-CM | POA: Insufficient documentation

## 2022-08-04 DIAGNOSIS — F0390 Unspecified dementia without behavioral disturbance: Secondary | ICD-10-CM | POA: Insufficient documentation

## 2022-08-04 DIAGNOSIS — I2510 Atherosclerotic heart disease of native coronary artery without angina pectoris: Secondary | ICD-10-CM | POA: Diagnosis not present

## 2022-08-04 DIAGNOSIS — N182 Chronic kidney disease, stage 2 (mild): Secondary | ICD-10-CM | POA: Diagnosis not present

## 2022-08-04 DIAGNOSIS — S30820A Blister (nonthermal) of lower back and pelvis, initial encounter: Secondary | ICD-10-CM | POA: Diagnosis not present
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
## 2022-08-05 NOTE — Progress Notes (Signed)
Craig [name], Craig [name] ([id number]) [id number] Page 1 of 4 Visit Report for 08/04/2022 Abuse Risk Screen Details Patient Name: Date of Service: Craig [name] 08/04/2022 8:15 A M Medical Record Number: [id number] Patient Account Number: [id number] Date of Birth/Sex: Treating RN: [birth year] (88 y.o. Craig [name] Primary Care Merlene Dante: Lonie Peak Other Clinician: Referring Anaelle Dunton: Treating Suliman Termini/Extender: Allen Derry Self, Referral Weeks in Treatment: 0 Abuse Risk Screen Items Answer ABUSE RISK SCREEN: Has anyone close to you tried to hurt or harm you recentlyo No Do you feel uncomfortable with anyone in your familyo No Has anyone forced you do things that you didnt want to doo No Electronic Signature(s) Signed: 08/04/2022 4:19:27 PM By: Angelina Pih Entered By: Angelina Pih on 08/04/2022 08:31:16 -------------------------------------------------------------------------------- Activities of Daily Living Details Patient Name: Date of Service: Craig Edwards F. 08/04/2022 8:15 A M Medical Record Number: [id number] Patient Account Number: [id number] Date of Birth/Sex: Treating RN: September 25, 1934 (88 y.o. Craig [name] Primary Care Lititia Sen: Lonie Peak Other Clinician: Referring Juelle Dickmann: Treating Javiana Anwar/Extender: Allen Derry Self, Referral Weeks in Treatment: 0 Activities of Daily Living Items Answer Activities of Daily Living (Please select one for each item) Drive Automobile Need Assistance T Medications ake Completely Able Use T elephone Completely Able Care for Appearance Completely Able Use T oilet Completely Able Bath / Shower Completely Able Dress Self Completely Able Feed Self Completely Able Walk Completely Able Get In / Out Bed Completely Able Housework Completely Able Prepare Meals Completely Able Handle Money Completely Able Shop for Self Completely Able Electronic Signature(s) Signed:  08/04/2022 4:19:27 PM By: Angelina Pih Entered By: Angelina Pih on 08/04/2022 08:31:48 -------------------------------------------------------------------------------- Education Screening Details Patient Name: Date of Service: Craig [name] 08/04/2022 8:15 A M Medical Record Number: [id number] Patient Account Number: [id number] Date of Birth/Sex: Treating RN: [birth year] (88 y.o. Craig [name] Primary Care Arlene Brickel: Lonie Peak Other Clinician: Referring Shantay Sonn: Treating Glenna Brunkow/Extender: Allen Derry Self, Referral Weeks in Treatment: [address] Craig [name], Craig [name] ([id number]) [id number] Page 2 of 4 Learning Preferences/Education Level/Primary Language Learning Preference: Explanation, Demonstration, Video, Public affairs consultant, Printed Material Preferred Language: Economist Language Barrier: No Translator Needed: No Memory Deficit: No Emotional Barrier: No Cultural/Religious Beliefs Affecting Medical Care: No Physical Barrier Impaired Vision: No Impaired Hearing: Yes Decreased Hand dexterity: No Knowledge/Comprehension Knowledge Level: High Comprehension Level: High Ability to understand written instructions: High Ability to understand verbal instructions: High Motivation Anxiety Level: Calm Cooperation: Cooperative Education Importance: Acknowledges Need Interest in Health Problems: Asks Questions Perception: Coherent Willingness to Engage in Self-Management High Activities: Readiness to Engage in Self-Management High Activities: Electronic Signature(s) Signed: 08/04/2022 4:19:27 PM By: Angelina Pih Entered By: Angelina Pih on 08/04/2022 08:32:09 -------------------------------------------------------------------------------- Fall Risk Assessment Details Patient Name: Date of Service: Craig [name] 08/04/2022 8:15 A M Medical Record Number: [id number] Patient Account Number:  [id number] Date of Birth/Sex: Treating RN: [birth year] (88 y.o. Craig [name] Primary Care Laporscha Linehan: Lonie Peak Other Clinician: Referring Marshawn Normoyle: Treating Marlowe Cinquemani/Extender: Allen Derry Self, Referral Weeks in Treatment: 0 Fall Risk Assessment Items Have you had 2 or more falls in the last 12 monthso 0 No Have you had any fall that resulted in injury in the last 12 monthso 0 No FALLS RISK SCREEN History of falling - immediate or within 3 months 0 No Secondary diagnosis (Do you have 2 or more medical diagnoseso) 0 No Ambulatory aid None/bed rest/wheelchair/nurse 0 Yes Crutches/cane/walker 0 No Furniture 0 No Intravenous  therapy Access/Saline/Heparin Lock 0 No Gait/Transferring Normal/ bed rest/ wheelchair 0 Yes Weak (short steps with or without shuffle, stooped but able to lift head while walking, may seek 0 No support from furniture) Impaired (short steps with shuffle, may have difficulty arising from chair, head down, impaired 0 No balance) Mental Status Oriented to own ability [address] Craig [name], Craig [name] ([id number]) [phone] [id number] Page 3 of 4 Signed: 08/04/2022 4:19:27 PM By: Angelina Pih Entered By: Angelina Pih on 08/04/2022 08:32:21 -------------------------------------------------------------------------------- Foot Assessment Details Patient Name: Date of Service: Craig [name] 08/04/2022 8:15 A M Medical Record Number: [id number] Patient Account Number: [id number] Date of Birth/Sex: Treating RN: Oct 02, 1934 (88 y.o. Craig [name] Primary Care Ashaad Gaertner: Lonie Peak Other Clinician: Referring Porter Nakama: Treating Catrinia Racicot/Extender: Allen Derry Self, Referral Weeks in Treatment: 0 Foot Assessment Items Site Locations + = Sensation present, - = Sensation absent, C = Callus, U = Ulcer R = Redness, W = Warmth, M = Maceration, PU = Pre-ulcerative lesion F = Fissure, S = Swelling, D =  Dryness Assessment Right: Left: Other Deformity: No No Prior Foot Ulcer: No No Prior Amputation: No No Charcot Joint: No No Ambulatory Status: Ambulatory Without Help Gait: Steady Notes wounds are to back Electronic Signature(s) Signed: 08/04/2022 4:19:27 PM By: Angelina Pih Entered By: Angelina Pih on 08/04/2022 08:32:46 -------------------------------------------------------------------------------- Nutrition Risk Screening Details Patient Name: Date of Service: Craig [name] 08/04/2022 8:15 A M Medical Record Number: [id number] Patient Account Number: [id number] Date of Birth/Sex: Treating RN: Sep 03, 1934 (88 y.o. Craig [name] Primary Care Jatorian Renault: Lonie Peak Other Clinician: Referring Akane Tessier: Treating Martiza Speth/Extender: Allen Derry Self, Referral Weeks in Treatment: 0 Height (in): 70 Weight (lbs): 180 Body Mass Index (BMI): 25.8 Craig [name], Craig [name] F ([id number]) [phone] [id number] Page 4 of 4 Nutrition Risk Screening Items Score Screening NUTRITION RISK SCREEN: I have an illness or condition that made me change the kind and/or amount of food I eat 0 No I eat fewer than two meals per day 0 No I eat few fruits and vegetables, or milk products 0 No I have three or more drinks of beer, liquor or wine almost every day 0 No I have tooth or mouth problems that make it hard for me to eat 0 No I don't always have enough money to buy the food I need 0 No I eat alone most of the time 0 No I take three or more different prescribed or over-the-counter drugs a day 0 No Without wanting to, I have lost or gained 10 pounds in the last six months 0 No I am not always physically able to shop, cook and/or feed myself 0 No Nutrition Protocols Good Risk Protocol 0 No interventions needed Moderate Risk Protocol High Risk Proctocol Risk Level: Good Risk Score: 0 Electronic Signature(s) Signed: 08/04/2022 4:19:27 PM By: Angelina Pih Entered By: Angelina Pih on 08/04/2022 08:32:33
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
## 2022-08-11 ENCOUNTER — Encounter: Payer: Medicare Other | Attending: Physician Assistant | Admitting: Physician Assistant

## 2022-08-11 DIAGNOSIS — E1151 Type 2 diabetes mellitus with diabetic peripheral angiopathy without gangrene: Secondary | ICD-10-CM | POA: Insufficient documentation

## 2022-08-11 DIAGNOSIS — L03312 Cellulitis of back [any part except buttock]: Secondary | ICD-10-CM | POA: Insufficient documentation

## 2022-08-11 DIAGNOSIS — I2510 Atherosclerotic heart disease of native coronary artery without angina pectoris: Secondary | ICD-10-CM | POA: Diagnosis not present

## 2022-08-11 DIAGNOSIS — L98422 Non-pressure chronic ulcer of back with fat layer exposed: Secondary | ICD-10-CM | POA: Diagnosis not present

## 2022-08-11 DIAGNOSIS — E1122 Type 2 diabetes mellitus with diabetic chronic kidney disease: Secondary | ICD-10-CM | POA: Insufficient documentation

## 2022-08-11 DIAGNOSIS — E11622 Type 2 diabetes mellitus with other skin ulcer: Secondary | ICD-10-CM | POA: Diagnosis present

## 2022-08-11 DIAGNOSIS — N182 Chronic kidney disease, stage 2 (mild): Secondary | ICD-10-CM | POA: Diagnosis not present

## 2022-08-11 NOTE — Progress Notes (Signed)
Craig [name], [name] ([id number]) [id number] Page 1 of 7 Visit Report for 08/11/2022 Arrival Information Details Patient Name: Date of Service: Craig [name] Craig [name] [name] Craig F. 08/11/2022 12:00 PM Medical Record Number: [id number] Patient Account Number: [id number] Date of Birth/Sex: Treating RN: 1934/10/09 (88 y.o. Craig [name] [name] Primary Care Craig [name] [name]: Craig [name] [name] Other Clinician: Referring Craig [name] [name]: Treating Craig [name] [name]/Extender: Craig [name] [name] Weeks in Treatment: 1 Visit Information History Since Last Visit Added or deleted any medications: No Patient Arrived: Ambulatory Any new allergies or adverse reactions: No Arrival Time: 12:01 Had a fall or experienced change in No Accompanied By: daughter activities of daily living that may affect Transfer Assistance: None risk of falls: Patient Identification Verified: Yes Hospitalized since last visit: No Secondary Verification Process Completed: Yes Has Dressing in Place as Prescribed: Yes Pain Present Now: No Electronic Signature(s) Signed: 08/11/2022 4:02:31 PM By: Angelina [name] Entered By: Angelina [name] on 08/11/2022 12:03:24 -------------------------------------------------------------------------------- Clinic Level of Care Assessment Details Patient Name: Date of Service: Craig [name] Craig [name] [name] Craig F. 08/11/2022 12:00 PM Medical Record Number: [id number] Patient Account Number: [id number] Date of Birth/Sex: Treating RN: Aug 30, 1934 (88 y.o. Craig [name] [name] Primary Care Klair Leising: Craig [name] [name] Other Clinician: Referring Craig [name] [name]: Treating Evaleigh [name]/Extender: Craig [name] [name] Weeks in Treatment: 1 Clinic Level of Care Assessment Items TOOL 4 Quantity Score []  - 0 Use when only an EandM is performed on FOLLOW-UP visit ASSESSMENTS - Nursing Assessment / Reassessment X- 1 10 Reassessment of Co-morbidities (includes updates in patient status) X- 1 5 Reassessment of Adherence to Treatment  Plan ASSESSMENTS - Wound and Skin A ssessment / Reassessment X - Simple Wound Assessment / Reassessment - one wound 1 5 []  - 0 Complex Wound Assessment / Reassessment - multiple wounds []  - 0 Dermatologic / Skin Assessment (not related to wound area) ASSESSMENTS - Focused Assessment []  - 0 Circumferential Edema Measurements - multi extremities []  - 0 Nutritional Assessment / Counseling / Intervention []  - 0 Lower Extremity Assessment (monofilament, tuning fork, pulses) []  - 0 Peripheral Arterial Disease Assessment (using hand held doppler) ASSESSMENTS - Ostomy and/or Continence Assessment and Care []  - 0 Incontinence Assessment and Management []  - 0 Ostomy Care Assessment and Management (repouching, etc.) PROCESS - Coordination of Care X - Simple Patient / Family Education for ongoing care 1 15 []  - 0 Complex (extensive) Patient / Family Education for ongoing care Craig, [name] ([id number]) [phone].pdf Page 2 of 7 X- 1 10 Staff obtains Consents, Records, T Results / Process Orders est []  - 0 Staff telephones HHA, Nursing Homes / Clarify orders / etc []  - 0 Routine Transfer to another Facility (non-emergent condition) []  - 0 Routine Hospital Admission (non-emergent condition) []  - 0 New Admissions / Manufacturing engineer / Ordering NPWT Apligraf, etc. , []  - 0 Emergency Hospital Admission (emergent condition) X- 1 10 Simple Discharge Coordination []  - 0 Complex (extensive) Discharge Coordination PROCESS - Special Needs []  - 0 Pediatric / Minor Patient Management []  - 0 Isolation Patient Management []  - 0 Hearing / Language / Visual special needs []  - 0 Assessment of Community assistance (transportation, D/C planning, etc.) []  - 0 Additional assistance / Altered mentation []  - 0 Support Surface(s) Assessment (bed, cushion, seat, etc.) INTERVENTIONS - Wound Cleansing / Measurement X - Simple Wound Cleansing - one wound 1 5 []  -  0 Complex Wound Cleansing - multiple wounds X- 1 5 Wound Imaging (photographs - any number of wounds) []  - 0 Wound Tracing (instead of photographs) X- 1  5 Simple Wound Measurement - one wound []  - 0 Complex Wound Measurement - multiple wounds INTERVENTIONS - Wound Dressings X - Small Wound Dressing one or multiple wounds 1 10 []  - 0 Medium Wound Dressing one or multiple wounds []  - 0 Large Wound Dressing one or multiple wounds []  - 0 Application of Medications - topical []  - 0 Application of Medications - injection INTERVENTIONS - Miscellaneous []  - 0 External ear exam []  - 0 Specimen Collection (cultures, biopsies, blood, body fluids, etc.) []  - 0 Specimen(s) / Culture(s) sent or taken to Lab for analysis []  - 0 Patient Transfer (multiple staff / Nurse, adult / Similar devices) []  - 0 Simple Staple / Suture removal (25 or less) []  - 0 Complex Staple / Suture removal (26 or more) []  - 0 Hypo / Hyperglycemic Management (close monitor of Blood Glucose) []  - 0 Ankle / Brachial Index (ABI) - do not check if billed separately X- 1 5 Vital Signs Has the patient been seen at the hospital within the last three years: Yes Total Score: 85 Level Of Care: New/Established - Level 3 Electronic Signature(s) Signed: 08/11/2022 4:02:31 PM By: Angelina [name] Entered By: Angelina [name] on 08/11/2022 12:43:58 Craig [name] [name] ([id number]) [id number] Page 3 of 7 -------------------------------------------------------------------------------- Encounter Discharge Information Details Patient Name: Date of Service: Craig [name] Craig [name] [name] Craig F. 08/11/2022 12:00 PM Medical Record Number: [id number] Patient Account Number: [id number] Date of Birth/Sex: Treating RN: [birth year] (88 y.o. Craig [name] [name] Primary Care Craig [name] [name]: Craig [name] [name] Other Clinician: Referring Craig [name] [name]: Treating Craig [name] [name]/Extender: Craig [name] [name] in Treatment: 1 Encounter  Discharge Information Items Discharge Condition: Stable Ambulatory Status: Ambulatory Discharge Destination: Home Transportation: Private Auto Accompanied By: daughter Schedule Follow-up Appointment: Yes Clinical Summary of Care: Electronic Signature(s) Signed: 08/11/2022 4:02:31 PM By: Angelina [name] Entered By: Angelina [name] on 08/11/2022 12:45:00 -------------------------------------------------------------------------------- Lower Extremity Assessment Details Patient Name: Date of Service: Craig [name] Craig [name] [name] Craig F. 08/11/2022 12:00 PM Medical Record Number: [id number] Patient Account Number: [id number] Date of Birth/Sex: Treating RN: 08/16/1934 (88 y.o. Craig [name] [name] Primary Care Lawanda Holzheimer: Craig [name] [name] Other Clinician: Referring Alby Schwabe: Treating Crisanto Nied/Extender: Craig [name] [name] Weeks in Treatment: 1 Electronic Signature(s) Signed: 08/11/2022 4:02:31 PM By: Angelina [name] Entered By: Angelina [name] on 08/11/2022 12:16:55 -------------------------------------------------------------------------------- Multi Wound Chart Details Patient Name: Date of Service: Craig [name] Craig [name] [name] Craig F. 08/11/2022 12:00 PM Medical Record Number: [id number] Patient Account Number: [id number] Date of Birth/Sex: Treating RN: 05-19-1934 (88 y.o. Craig [name] [name] Primary Care Nicoya Friel: Craig [name] [name] Other Clinician: Referring Delberta Folts: Treating Yameli Delamater/Extender: Craig [name] [name] Weeks in Treatment: 1 Vital Signs Height(in): 70 Pulse(bpm): 71 Weight(lbs): 180 Blood Pressure(mmHg): 151/63 Body Mass Index(BMI): 25.8 Temperature(F): 98.4 Respiratory Rate(breaths/min): 18 [2:Photos:] [N/A:N/A] Right Back N/A N/A Wound Location: Blister N/A N/A Wounding Event: Cellulitis N/A N/A Primary Etiology: ALVIE, MAZZOCCHI ([id number]) [id number] Page 4 of 7 Coronary Artery Disease, Myocardial N/A N/A Comorbid History: Infarction, Type II  Diabetes, History of Burn 05/07/2022 N/A N/A Date Acquired: 1 N/A N/A Weeks of Treatment: Open N/A N/A Wound Status: No N/A N/A Wound Recurrence: 16x8x0.1 N/A N/A Measurements L x W x D (cm) 100.531 N/A N/A A (cm) : rea 10.053 N/A N/A Volume (cm) : 53.50% N/A N/A % Reduction in Area: 53.50% N/A N/A % Reduction in Volume: Full Thickness Without Exposed N/A N/A Classification: Support Structures Medium N/A N/A Exudate A mount: Serosanguineous N/A N/A Exudate Type: red, brown N/A N/A Exudate Color: Large (67-100%) N/A N/A Granulation A  mount: Red, Pink N/A N/A Granulation Quality: Small (1-33%) N/A N/A Necrotic A mount: Fat Layer (Subcutaneous Tissue): Yes N/A N/A Exposed Structures: Small (1-33%) N/A N/A Epithelialization: Treatment Notes Electronic Signature(s) Signed: 08/11/2022 4:02:31 PM By: Angelina [name] Entered By: Angelina [name] on 08/11/2022 12:34:37 -------------------------------------------------------------------------------- Multi-Disciplinary Care Plan Details Patient Name: Date of Service: Craig [name] [name], Craig [name] Craig F. 08/11/2022 12:00 PM Medical Record Number: [id number] Patient Account Number: [id number] Date of Birth/Sex: Treating RN: [birth year] (88 y.o. Craig [name] [name] Primary Care Georgenia Salim: Craig [name] [name] Other Clinician: Referring Jasun Gasparini: Treating Kimika Streater/Extender: Craig [name] [name] Weeks in Treatment: 1 Active Inactive Wound/Skin Impairment Nursing Diagnoses: Impaired tissue integrity Knowledge deficit related to ulceration/compromised skin integrity Goals: Ulcer/skin breakdown will have a volume reduction of 30% by week 4 Date Initiated: 08/04/2022 Target Resolution Date: 09/01/2022 Goal Status: Active Ulcer/skin breakdown will have a volume reduction of 50% by week 8 Date Initiated: 08/04/2022 Target Resolution Date: 09/29/2022 Goal Status: Active Ulcer/skin breakdown will have a volume reduction of 80% by week 12 Date  Initiated: 08/04/2022 Target Resolution Date: 10/27/2022 Goal Status: Active Ulcer/skin breakdown will heal within 14 weeks Date Initiated: 08/04/2022 Target Resolution Date: 11/10/2022 Goal Status: Active Interventions: Assess patient/caregiver ability to obtain necessary supplies Assess patient/caregiver ability to perform ulcer/skin care regimen upon admission and as needed Assess ulceration(s) every visit Provide education on ulcer and skin care Treatment Activities: Skin care regimen initiated : 08/04/2022 Topical wound management initiated : 08/04/2022 Notes: Craig [name] [name], Craig [name] [name] ([id number]) [id number] Page 5 of 7 Electronic Signature(s) Signed: 08/11/2022 4:02:31 PM By: Angelina [name] Entered By: Angelina [name] on 08/11/2022 12:44:15 -------------------------------------------------------------------------------- Pain Assessment Details Patient Name: Date of Service: Craig [name] Craig [name] [name] Craig F. 08/11/2022 12:00 PM Medical Record Number: [id number] Patient Account Number: [id number] Date of Birth/Sex: Treating RN: 01/20/1935 (88 y.o. Craig [name] [name] Primary Care Shacoria Latif: Craig [name] [name] Other Clinician: Referring Tanekia Ryans: Treating Daeshawn Redmann/Extender: Craig [name] [name] Weeks in Treatment: 1 Active Problems Location of Pain Severity and Description of Pain Patient Has Paino No Site Locations Rate the pain. Current Pain Level: 0 Pain Management and Medication Current Pain Management: Electronic Signature(s) Signed: 08/11/2022 4:02:31 PM By: Angelina [name] Entered By: Angelina [name] on 08/11/2022 12:04:57 -------------------------------------------------------------------------------- Patient/Caregiver Education Details Patient Name: Date of Service: Craig [name] Craig [name] [name] Craig F. 5/7/2024andnbsp12:00 PM Medical Record Number: [id number] Patient Account Number: [id number] Date of Birth/Gender: Treating RN: Mar 29, 1935 (88 y.o. Craig [name] [name] Primary Care Physician: Craig [name] [name] Other Clinician: Referring Physician: Treating Physician/Extender: Craig [name] [name] in Treatment: 1 Education Assessment Education Provided To: Patient Education Topics Provided Wound/Skin Impairment: Handouts: Caring for Your Ulcer Methods: Explain/Verbal Responses: State content correctly Craig [name] [name], Craig [name] [name] ([id number]) [id number] Page 6 of 7 Electronic Signature(s) Signed: 08/11/2022 4:02:31 PM By: Angelina [name] Entered By: Angelina [name] on 08/11/2022 12:44:24 -------------------------------------------------------------------------------- Wound Assessment Details Patient Name: Date of Service: Craig [name] Craig [name] [name] Craig F. 08/11/2022 12:00 PM Medical Record Number: [id number] Patient Account Number: [id number] Date of Birth/Sex: Treating RN: Dec 20, 1934 (88 y.o. Craig [name] [name] Primary Care Lindy Pennisi: Craig [name] [name] Other Clinician: Referring Kiernan Atkerson: Treating Ocie Tino/Extender: Craig [name] [name] Weeks in Treatment: 1 Wound Status Wound Number: 2 Primary Cellulitis Etiology: Wound Location: Right Back Wound Open Wounding Event: Blister Status: Date Acquired: 05/07/2022 Comorbid Coronary Artery Disease, Myocardial Infarction, Type II Weeks Of Treatment: 1 History: Diabetes, History of Burn Clustered Wound: No Photos Wound Measurements Length: (cm) 16 Width: (cm) 8 Depth: (cm) 0.1 Area: (cm) 100.531 Volume: (cm) 10.053 % Reduction in Area:  53.5% % Reduction in Volume: 53.5% Epithelialization: Small (1-33%) Tunneling: No Undermining: No Wound Description Classification: Full Thickness Without Exposed Support Structures Exudate Amount: Medium Exudate Type: Serosanguineous Exudate Color: red, brown Foul Odor After Cleansing: No Slough/Fibrino Yes Wound Bed Granulation Amount: Large (67-100%) Exposed Structure Granulation Quality: Red, Pink Fat Layer (Subcutaneous  Tissue) Exposed: Yes Necrotic Amount: Small (1-33%) Necrotic Quality: Adherent Slough Treatment Notes Wound #2 (Back) Wound Laterality: Right Cleanser Byram Ancillary Kit - 15 Day Supply Discharge Instruction: Use supplies as instructed; Kit contains: (15) Saline Bullets; (15) 3x3 Gauze; 15 pr Gloves Soap and Water Discharge Instruction: Gently cleanse wound with antibacterial soap, rinse and pat dry prior to dressing wounds Peri-Wound Care Topical Triamcinolone Acetonide Cream, 0.1%, 15 (g) tube Discharge Instruction: Apply as directed by Craig [name] [name]. Craig [name] [name], Craig [name] [name] ([id number]) [id number] Page 7 of 7 Desitin Maximum Strength Ointment, 1 (oz) tube Ketoconazole Cream 2%, 30 (g), tube Nystatin Cream, 15 (g) tube Discharge Instruction: USED IN OFFICE AS KETOCONAZOLE REPLACEMENT Primary Dressing Cutimed Sorbact 1.5x 2.38 (in/in) Discharge Instruction: A bacteria- and fungi binding wound dressing, suitable for cavities and fistulas. It is suitable as a wound filler and allows the passage of wound exudate into a secondary dressing. The dressing helps reducing odor and pain and can improve healing. Secondary Dressing Zetuvit Plus Dressing, 8x16 (in/in) Secured With Medipore T - 24M Medipore H Soft Cloth Surgical T ape ape, 2x2 (in/yd) Compression Wrap Compression Stockings Add-Ons Electronic Signature(s) Signed: 08/11/2022 4:02:31 PM By: Angelina [name] Entered By: Angelina [name] on 08/11/2022 12:16:45 -------------------------------------------------------------------------------- Vitals Details Patient Name: Date of Service: Craig [name] [name], Craig [name] Craig F. 08/11/2022 12:00 PM Medical Record Number: [id number] Patient Account Number: [id number] Date of Birth/Sex: Treating RN: [birth year] (88 y.o. Craig [name] [name] Primary Care Josip Merolla: Craig [name] [name] Other Clinician: Referring Elishah Ashmore: Treating Abbygayle Helfand/Extender: Craig [name] [name] Weeks in Treatment:  1 Vital Signs Time Taken: 12:03 Temperature (F): 98.4 Height (in): 70 Pulse (bpm): 71 Weight (lbs): 180 Respiratory Rate (breaths/min): 18 Body Mass Index (BMI): 25.8 Blood Pressure (mmHg): 151/63 Reference [name]: 80 - 120 mg / dl Electronic Signature(s) Signed: 08/11/2022 4:02:31 PM By: Angelina [name] Entered By: Angelina [name] on 08/11/2022 12:04:51

## 2022-08-11 NOTE — Progress Notes (Signed)
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 [employer] Ancillary Kit - 15 Day Supply (Generic) Every Other Day/30 Days Discharge Instructions: Use supplies as instructed; Kit contains: (15) Saline Bullets; (15) 3x3 Gauze; 15 pr Gloves Cleanser: Soap and Water Every Other Day/30 Days Discharge Instructions: Gently cleanse wound with antibacterial soap, rinse and pat dry prior to dressing wounds Topical: Triamcinolone Acetonide Cream, 0.1%, 15 (g) tube Every Other Day/30 Days Discharge Instructions: Apply as directed by provider. Topical: Desitin Maximum Strength Ointment, 1 (oz) tube Every Other Day/30 Days Topical: Ketoconazole Cream 2%, 30 (g), tube Every Other Day/30 Days Topical: Nystatin Cream, 15 (g) tube Every Other Day/30 Days Discharge Instructions: USED IN OFFICE AS KETOCONAZOLE REPLACEMENT Prim Dressing: Cutimed Sorbact 1.5x 2.38 (in/in) (Generic) Every Other Day/30 Days ary Discharge Instructions: A bacteria- and fungi binding wound dressing, suitable for cavities and fistulas. It is  suitable as a wound filler and allows the passage of wound exudate into a secondary dressing. The dressing helps reducing odor and pain and can improve healing. Secondary Dressing: Zetuvit Plus Dressing, 8x16 (in/in) (Generic) Every Other Day/30 Days Secured With: Medipore T - 65M Medipore H Soft Cloth Surgical T ape ape, 2x2 (in/yd) (Generic) Every Other Day/30 Days 1. I would recommend that we have the patient continue to monitor for any evidence of infection or worsening in general I do believe that we are making good progress and very pleased. 2. I am going to suggest as well that the patient should continue with the Zetuvit to cover over top of the mixture of triamcinolone, ketoconazole, and nystatin cream 1 to one-to-one. We will see patient back for reevaluation in 1 week here in the clinic. If anything worsens or changes patient will contact our office for additional recommendations. Electronic Signature(s) Signed: 08/11/2022 1:17:33 PM By: Allen Derry PA-C Entered By: Allen Derry on 08/11/2022 13:17:33 -------------------------------------------------------------------------------- SuperBill Details Patient Name: Date of Service: Craig [name] 08/11/2022 Medical Record Number: [id number] Patient Account Number: [id number] Date of Birth/Sex: Treating RN: 1934/12/11 (88 y.o. 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 Facility Procedures : CPT4 Code: [id number] Description: 99213 - WOUND CARE VISIT-LEV 3 EST PT Modifier: Quantity: 1 Physician  Procedures : CPT4 Code Description Modifier [id number] 99213 - WC PHYS LEVEL 3 - EST PT ICD-10 Diagnosis Description L03.312 Cellulitis of back [any part except buttock] 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

## 2022-08-18 ENCOUNTER — Encounter: Payer: Medicare Other | Admitting: Physician Assistant

## 2022-08-18 DIAGNOSIS — E1151 Type 2 diabetes mellitus with diabetic peripheral angiopathy without gangrene: Secondary | ICD-10-CM | POA: Diagnosis not present

## 2022-08-18 DIAGNOSIS — I2510 Atherosclerotic heart disease of native coronary artery without angina pectoris: Secondary | ICD-10-CM | POA: Diagnosis not present

## 2022-08-18 DIAGNOSIS — N182 Chronic kidney disease, stage 2 (mild): Secondary | ICD-10-CM | POA: Diagnosis not present

## 2022-08-18 DIAGNOSIS — E1122 Type 2 diabetes mellitus with diabetic chronic kidney disease: Secondary | ICD-10-CM | POA: Diagnosis not present

## 2022-08-18 DIAGNOSIS — L03312 Cellulitis of back [any part except buttock]: Secondary | ICD-10-CM | POA: Diagnosis not present

## 2022-08-18 DIAGNOSIS — L98422 Non-pressure chronic ulcer of back with fat layer exposed: Secondary | ICD-10-CM | POA: Diagnosis not present
